# Patient Record
Sex: Male | Born: 1955 | Race: White | Hispanic: No | Marital: Single | Smoking: Former smoker
Health system: Southern US, Community
[De-identification: ages and names within clinical notes are randomized; demographics above are authoritative.]

## PROBLEM LIST (undated history)

## (undated) ENCOUNTER — Emergency Department: Admission: EM | Payer: Medicare Other

## (undated) DIAGNOSIS — C4491 Basal cell carcinoma of skin, unspecified: Secondary | ICD-10-CM

## (undated) DIAGNOSIS — G43909 Migraine, unspecified, not intractable, without status migrainosus: Secondary | ICD-10-CM

## (undated) DIAGNOSIS — M1990 Unspecified osteoarthritis, unspecified site: Secondary | ICD-10-CM

## (undated) DIAGNOSIS — K222 Esophageal obstruction: Secondary | ICD-10-CM

## (undated) DIAGNOSIS — K219 Gastro-esophageal reflux disease without esophagitis: Secondary | ICD-10-CM

## (undated) DIAGNOSIS — I10 Essential (primary) hypertension: Secondary | ICD-10-CM

## (undated) HISTORY — PX: ANKLE ARTHROPLASTY: SUR68

---

## 2005-08-15 ENCOUNTER — Emergency Department: Payer: Self-pay | Admitting: Emergency Medicine

## 2008-05-05 ENCOUNTER — Emergency Department: Payer: Self-pay | Admitting: Emergency Medicine

## 2008-05-19 ENCOUNTER — Emergency Department: Payer: Self-pay | Admitting: Emergency Medicine

## 2008-10-12 ENCOUNTER — Ambulatory Visit: Payer: Self-pay | Admitting: Gastroenterology

## 2012-03-13 ENCOUNTER — Emergency Department: Payer: Self-pay | Admitting: Emergency Medicine

## 2012-03-18 ENCOUNTER — Emergency Department: Payer: Self-pay | Admitting: *Deleted

## 2013-03-20 ENCOUNTER — Emergency Department: Payer: Self-pay | Admitting: Emergency Medicine

## 2013-03-31 ENCOUNTER — Emergency Department: Payer: Self-pay | Admitting: Emergency Medicine

## 2013-04-03 LAB — BETA STREP CULTURE(ARMC)

## 2013-07-18 DIAGNOSIS — R1310 Dysphagia, unspecified: Secondary | ICD-10-CM | POA: Insufficient documentation

## 2013-07-18 DIAGNOSIS — K222 Esophageal obstruction: Secondary | ICD-10-CM | POA: Insufficient documentation

## 2013-07-30 DIAGNOSIS — I10 Essential (primary) hypertension: Secondary | ICD-10-CM | POA: Diagnosis present

## 2013-10-18 ENCOUNTER — Emergency Department: Payer: Self-pay | Admitting: Emergency Medicine

## 2014-03-31 ENCOUNTER — Emergency Department: Payer: Self-pay | Admitting: Internal Medicine

## 2014-04-03 ENCOUNTER — Emergency Department: Payer: Self-pay | Admitting: Emergency Medicine

## 2014-10-16 DIAGNOSIS — Z8614 Personal history of Methicillin resistant Staphylococcus aureus infection: Secondary | ICD-10-CM

## 2014-10-16 HISTORY — DX: Personal history of Methicillin resistant Staphylococcus aureus infection: Z86.14

## 2014-12-25 DIAGNOSIS — K31A0 Gastric intestinal metaplasia, unspecified: Secondary | ICD-10-CM | POA: Insufficient documentation

## 2015-05-01 ENCOUNTER — Inpatient Hospital Stay
Admission: EM | Admit: 2015-05-01 | Discharge: 2015-05-02 | DRG: 918 | Disposition: A | Discharge status: Home / Self Care | Payer: Self-pay | Attending: Internal Medicine | Admitting: Internal Medicine

## 2015-05-01 ENCOUNTER — Emergency Department: Payer: Self-pay

## 2015-05-01 DIAGNOSIS — T40601A Poisoning by unspecified narcotics, accidental (unintentional), initial encounter: Secondary | ICD-10-CM | POA: Diagnosis present

## 2015-05-01 DIAGNOSIS — R0681 Apnea, not elsewhere classified: Secondary | ICD-10-CM

## 2015-05-01 DIAGNOSIS — F1210 Cannabis abuse, uncomplicated: Secondary | ICD-10-CM | POA: Diagnosis present

## 2015-05-01 DIAGNOSIS — F1310 Sedative, hypnotic or anxiolytic abuse, uncomplicated: Secondary | ICD-10-CM | POA: Diagnosis present

## 2015-05-01 DIAGNOSIS — I10 Essential (primary) hypertension: Secondary | ICD-10-CM | POA: Diagnosis present

## 2015-05-01 DIAGNOSIS — T40604A Poisoning by unspecified narcotics, undetermined, initial encounter: Principal | ICD-10-CM | POA: Diagnosis present

## 2015-05-01 DIAGNOSIS — Z79891 Long term (current) use of opiate analgesic: Secondary | ICD-10-CM

## 2015-05-01 HISTORY — DX: Essential (primary) hypertension: I10

## 2015-05-01 MED ORDER — NALOXONE HCL 1 MG/ML IJ SOLN
0.4000 mg | Freq: Once | INTRAMUSCULAR | Status: AC
  Administered 2015-05-02: 0.4 mg via INTRAVENOUS
  Filled 2015-05-01: qty 2

## 2015-05-01 MED ORDER — SODIUM CHLORIDE 0.9 % IV BOLUS (SEPSIS)
1000.0000 mL | Freq: Once | INTRAVENOUS | Status: AC
  Administered 2015-05-02: 1000 mL via INTRAVENOUS
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
## 2015-05-02 ENCOUNTER — Encounter: Payer: Self-pay | Admitting: Internal Medicine

## 2015-05-02 ENCOUNTER — Emergency Department: Payer: Self-pay

## 2015-05-02 DIAGNOSIS — T40601A Poisoning by unspecified narcotics, accidental (unintentional), initial encounter: Secondary | ICD-10-CM | POA: Diagnosis present

## 2015-05-02 LAB — BLOOD GAS, ARTERIAL
ACID-BASE EXCESS: 4.2 mmol/L — AB (ref 0.0–3.0)
Bicarbonate: 27.3 mEq/L (ref 21.0–28.0)
FIO2: 0.21 %
O2 Saturation: 99.3 %
Patient temperature: 37
pCO2 arterial: 35 mmHg (ref 32.0–48.0)
pH, Arterial: 7.5 — ABNORMAL HIGH (ref 7.350–7.450)
pO2, Arterial: 138 mmHg — ABNORMAL HIGH (ref 83.0–108.0)

## 2015-05-02 LAB — COMPREHENSIVE METABOLIC PANEL
ALT: 17 U/L (ref 17–63)
AST: 20 U/L (ref 15–41)
Albumin: 3.7 g/dL (ref 3.5–5.0)
Alkaline Phosphatase: 52 U/L (ref 38–126)
Anion gap: 8 (ref 5–15)
BUN: 8 mg/dL (ref 6–20)
CALCIUM: 8.8 mg/dL — AB (ref 8.9–10.3)
CO2: 29 mmol/L (ref 22–32)
CREATININE: 0.73 mg/dL (ref 0.61–1.24)
Chloride: 102 mmol/L (ref 101–111)
GLUCOSE: 98 mg/dL (ref 65–99)
Potassium: 3 mmol/L — ABNORMAL LOW (ref 3.5–5.1)
Sodium: 139 mmol/L (ref 135–145)
Total Bilirubin: 1.5 mg/dL — ABNORMAL HIGH (ref 0.3–1.2)
Total Protein: 6.7 g/dL (ref 6.5–8.1)

## 2015-05-02 LAB — URINALYSIS COMPLETE WITH MICROSCOPIC (ARMC ONLY)
BILIRUBIN URINE: NEGATIVE
Bacteria, UA: NONE SEEN
Glucose, UA: NEGATIVE mg/dL
Ketones, ur: NEGATIVE mg/dL
LEUKOCYTES UA: NEGATIVE
Nitrite: NEGATIVE
PH: 6 (ref 5.0–8.0)
Protein, ur: NEGATIVE mg/dL
Specific Gravity, Urine: 1.003 — ABNORMAL LOW (ref 1.005–1.030)
Squamous Epithelial / LPF: NONE SEEN

## 2015-05-02 LAB — CBC WITH DIFFERENTIAL/PLATELET
BASOS ABS: 0 10*3/uL (ref 0–0.1)
BASOS PCT: 1 %
EOS ABS: 0.1 10*3/uL (ref 0–0.7)
EOS PCT: 2 %
HCT: 38.4 % — ABNORMAL LOW (ref 40.0–52.0)
HEMOGLOBIN: 13 g/dL (ref 13.0–18.0)
LYMPHS ABS: 1.6 10*3/uL (ref 1.0–3.6)
Lymphocytes Relative: 24 %
MCH: 30.3 pg (ref 26.0–34.0)
MCHC: 34 g/dL (ref 32.0–36.0)
MCV: 89.2 fL (ref 80.0–100.0)
Monocytes Absolute: 0.6 10*3/uL (ref 0.2–1.0)
Monocytes Relative: 9 %
Neutro Abs: 4.2 10*3/uL (ref 1.4–6.5)
Neutrophils Relative %: 64 %
Platelets: 264 10*3/uL (ref 150–440)
RBC: 4.3 MIL/uL — AB (ref 4.40–5.90)
RDW: 13.2 % (ref 11.5–14.5)
WBC: 6.6 10*3/uL (ref 3.8–10.6)

## 2015-05-02 LAB — ACETAMINOPHEN LEVEL: Acetaminophen (Tylenol), Serum: <10 ug/mL — ABNORMAL LOW (ref 10–30)

## 2015-05-02 LAB — URINE DRUG SCREEN, QUALITATIVE (ARMC ONLY)
Amphetamines, Ur Screen: NOT DETECTED
BARBITURATES, UR SCREEN: NOT DETECTED
BENZODIAZEPINE, UR SCRN: POSITIVE — AB
Cannabinoid 50 Ng, Ur ~~LOC~~: POSITIVE — AB
Cocaine Metabolite,Ur ~~LOC~~: NOT DETECTED
MDMA (Ecstasy)Ur Screen: NOT DETECTED
Methadone Scn, Ur: POSITIVE — AB
OPIATE, UR SCREEN: POSITIVE — AB
Phencyclidine (PCP) Ur S: NOT DETECTED
Tricyclic, Ur Screen: NOT DETECTED

## 2015-05-02 LAB — PROTIME-INR
INR: 1.02
Prothrombin Time: 13.6 seconds (ref 11.4–15.0)

## 2015-05-02 LAB — HEMOGLOBIN A1C: Hgb A1c MFr Bld: 5.3 % (ref 4.0–6.0)

## 2015-05-02 LAB — ETHANOL

## 2015-05-02 LAB — SALICYLATE LEVEL

## 2015-05-02 LAB — TSH: TSH: 0.903 u[IU]/mL (ref 0.350–4.500)

## 2015-05-02 LAB — TROPONIN I: Troponin I: <0.03 ng/mL (ref <0.031)

## 2015-05-02 MED ORDER — HEPARIN SODIUM (PORCINE) 5000 UNIT/ML IJ SOLN
5000.0000 [IU] | Freq: Three times a day (TID) | INTRAMUSCULAR | Status: DC
  Administered 2015-05-02: 5000 [IU] via SUBCUTANEOUS
  Filled 2015-05-02: qty 1

## 2015-05-02 MED ORDER — NALOXONE HCL 1 MG/ML IJ SOLN
0.5000 mg/h | INTRAMUSCULAR | Status: DC
  Administered 2015-05-02: 0.5 mg/h via INTRAVENOUS
  Filled 2015-05-02: qty 4

## 2015-05-02 MED ORDER — ONDANSETRON HCL 4 MG PO TABS: 4.0000 mg | ORAL_TABLET | Freq: Four times a day (QID) | ORAL | Status: DC | PRN

## 2015-05-02 MED ORDER — NALOXONE HCL 1 MG/ML IJ SOLN
INTRAMUSCULAR | Status: AC
  Filled 2015-05-02: qty 2

## 2015-05-02 MED ORDER — NALOXONE HCL 1 MG/ML IJ SOLN
0.4000 mg | Freq: Once | INTRAMUSCULAR | Status: AC
  Administered 2015-05-02: 0.4 mg via INTRAVENOUS
  Filled 2015-05-02: qty 2

## 2015-05-02 MED ORDER — SODIUM CHLORIDE 0.9 % IV BOLUS (SEPSIS)
1000.0000 mL | Freq: Once | INTRAVENOUS | Status: AC
  Administered 2015-05-02: 1000 mL via INTRAVENOUS

## 2015-05-02 MED ORDER — NALOXONE HCL 1 MG/ML IJ SOLN
0.4000 mg | Freq: Once | INTRAMUSCULAR | Status: AC
  Administered 2015-05-02: 0.4 mg via INTRAVENOUS

## 2015-05-02 MED ORDER — POTASSIUM CHLORIDE IN NACL 40-0.9 MEQ/L-% IV SOLN
INTRAVENOUS | Status: DC
  Administered 2015-05-02: 100 mL/h via INTRAVENOUS
  Filled 2015-05-02 (×4): qty 1000

## 2015-05-02 MED ORDER — ONDANSETRON HCL 4 MG/2ML IJ SOLN: 4.0000 mg | Freq: Four times a day (QID) | INTRAMUSCULAR | Status: DC | PRN

## 2015-05-02 NOTE — ED Notes (Signed)
TTS tried to talk to pt. Pt. Irritable.  TTS will wait till morning to assess.
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
## 2015-05-02 NOTE — ED Notes (Signed)
Pt. Upon given narcan respirations increased from 6/min to 18/min.  Pt. Has become irritable and wanted more blankets or his clothes.  Pt. Given more blankets.

## 2015-05-02 NOTE — [facility] Counselor (Signed)
Unable to assess- pt irritable and incoherent
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
## 2015-05-02 NOTE — Discharge Instructions (Signed)

## 2015-05-02 NOTE — Progress Notes (Signed)
Pt's daughter arrived, tearful.  Unaware of pt's admission to hospital. Pt states he does not remember why he was admitted.  With pt's permission, explained to pt's daughter the circumstances surrounding pt's admission here.  Went over AVS w/pt and his daughter, including the community resources for substance abuse.  Encouraged pt to f/u w/his PCP with regard to taking methadone and norco at the same time.  Also discouraged pt from polysubstance abuse and driving. Pt and daughter verbalized understanding of all discharge instructions.  Pt discharged stable and ambulatory w/daughter.
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
## 2015-07-25 ENCOUNTER — Encounter: Payer: Self-pay | Admitting: Emergency Medicine

## 2015-07-25 ENCOUNTER — Emergency Department
Admission: EM | Admit: 2015-07-25 | Discharge: 2015-07-25 | Disposition: A | Discharge status: Home / Self Care | Payer: Self-pay | Attending: Emergency Medicine | Admitting: Emergency Medicine

## 2015-07-25 DIAGNOSIS — Z87891 Personal history of nicotine dependence: Secondary | ICD-10-CM | POA: Insufficient documentation

## 2015-07-25 DIAGNOSIS — I10 Essential (primary) hypertension: Secondary | ICD-10-CM | POA: Insufficient documentation

## 2015-07-25 DIAGNOSIS — L03116 Cellulitis of left lower limb: Secondary | ICD-10-CM | POA: Insufficient documentation

## 2015-07-25 MED ORDER — SULFAMETHOXAZOLE-TRIMETHOPRIM 800-160 MG PO TABS: 2.0000 | ORAL_TABLET | Freq: Two times a day (BID) | ORAL | Status: DC

## 2015-07-25 MED ORDER — TRAMADOL HCL 50 MG PO TABS: 50.0000 mg | ORAL_TABLET | Freq: Two times a day (BID) | ORAL | Status: DC

## 2015-07-25 MED ORDER — SULFAMETHOXAZOLE-TRIMETHOPRIM 800-160 MG PO TABS
2.0000 | ORAL_TABLET | Freq: Once | ORAL | Status: AC
  Administered 2015-07-25: 2 via ORAL
  Filled 2015-07-25: qty 2

## 2015-07-25 NOTE — ED Notes (Signed)
Pt reports painful red area without drainage to top left leg. Denies fever

## 2015-07-25 NOTE — ED Provider Notes (Signed)
[facility] Emergency Department Provider Note ____________________________________________  Time seen: 0825  I have reviewed the triage vital signs and the nursing notes.  HISTORY  Chief Complaint  Abscess  HPI Johnathan Harvey is a 59 y.o. male Reports to the ED with swelling and redness to the left leg just above the right knee for the last 2 days. He is unaware of any particular injury or trauma to the leg, but remembers recalling that he thought he had been bitten by a spider or mosquito a few days earlier. He does admit to a small bump that appeared, that he later squeezed. Since that time as noted increased redness and pain to the skin above and to the outside of the left knee. He denies any intermittent fevers, chills, sweats. He does report some pain and discomfort both to the scan and with bending the knee. He does report previous history of staph infection, and is concerned for that at this time.He reports pain at 8/10 in triage.  Past Medical History  Diagnosis Date  . Hypertension     Patient Active Problem List   Diagnosis Date Noted  . Opiate overdose 05/02/2015    Past Surgical History  Procedure Laterality Date  . Ankle arthroplasty      Current Outpatient Rx  Name  Route  Sig  Dispense  Refill  . HYDROcodone-acetaminophen (NORCO) 10-325 MG per tablet   Oral   Take 1 tablet by mouth every 6 (six) hours as needed.         . sulfamethoxazole-trimethoprim (BACTRIM DS,SEPTRA DS) 800-160 MG tablet   Oral   Take 2 tablets by mouth 2 (two) times daily.   40 tablet   0   . traMADol (ULTRAM) 50 MG tablet   Oral   Take 1 tablet (50 mg total) by mouth 2 (two) times daily.   10 tablet   0    Allergies Review of patient's allergies indicates no known allergies.  Family History  Problem Relation Age of Onset  . Other      Patient cannot contribute due to intoxication    Social History Social History  Substance Use Topics  .  Smoking status: Former Smoker -- 0.00 packs/day  . Smokeless tobacco: None  . Alcohol Use: No   Review of Systems  Constitutional: Negative for fever. Eyes: Negative for visual changes. ENT: Negative for sore throat. Cardiovascular: Negative for chest pain. Respiratory: Negative for shortness of breath. Gastrointestinal: Negative for abdominal pain, vomiting and diarrhea. Genitourinary: Negative for dysuria. Musculoskeletal: Negative for back pain. Skin: Negative for rash. LLE cellulitis as above Neurological: Negative for headaches, focal weakness or numbness. ____________________________________________  PHYSICAL EXAM:  VITAL SIGNS: ED Triage Vitals  Enc Vitals Group     BP 07/25/15 0753 128/77 mmHg     Pulse Rate 07/25/15 0753 91     Resp 07/25/15 0753 18     Temp 07/25/15 0753 98.2 F (36.8 C)     Temp Source 07/25/15 0753 Oral     SpO2 07/25/15 0753 99 %     Weight 07/25/15 0753 140 lb (63.504 kg)     Height 07/25/15 0753 5\' 10"  (1.778 m)     Head Cir --      Peak Flow --      Pain Score 07/25/15 0754 8     Pain Loc --      Pain Edu? --      Excl. in [place]? --  Constitutional: Alert and oriented. Well appearing and in no distress. Head: Normocephalic and atraumatic.      Eyes: Conjunctivae are normal. PERRL. Normal extraocular movements      Ears: Canals clear. TMs intact bilaterally.   Nose: No congestion/rhinorrhea.   Mouth/Throat: Mucous membranes are moist.   Neck: Supple. No thyromegaly. Hematological/Lymphatic/Immunological: No cervical lymphadenopathy. Cardiovascular: Normal rate, regular rhythm.  Respiratory: Normal respiratory effort. No wheezes/rales/rhonchi. Gastrointestinal: Soft and nontender. No distention. Musculoskeletal: normal knee exam on the left.Nontender with normal range of motion in all extremities.  Neurologic:  Normal gait without ataxia. Normal speech and language. No gross focal neurologic deficits are appreciated. Skin:   Skin is warm, dry and intact. No rash noted.patient with a large area of superficial erythema to the superior, lateral aspect of the left knee. Superiorly he is noted to have a quarter sized area of induration and redness with some superficial scab appreciated. There also appears to be some early streaking proximally. Psychiatric: Mood and affect are normal. Patient exhibits appropriate insight and judgment. ____________________________________________  PROCEDURES  Bactrim DS 2 tabs PO ____________________________________________  INITIAL IMPRESSION / ASSESSMENT AND PLAN / ED COURSE  Patient presents with a left lower extremity superficial cellulitis with a small area of induration noted superiorly. He will be discharged with Bactrim to dose 2 tabs twice daily as well as Ultram as needed for pain he is encouraged to rest with the leg elevated and apply warm compresses to promote healing. Return to the ED in 2-3 days as needed for wound check. He is also advised to return in the interim for any indication of an abscess collection for probable I&D. ____________________________________________  FINAL CLINICAL IMPRESSION(S) / ED DIAGNOSES  Final diagnoses:  Cellulitis of left lower extremity      Melvenia Needles, PA-C 07/25/15 [place], MD 07/25/15 1640

## 2015-07-25 NOTE — Discharge Instructions (Signed)
Cellulitis Cellulitis is an infection of the skin and the tissue beneath it. The infected area is usually red and tender. Cellulitis occurs most often in the arms and lower legs.  CAUSES  Cellulitis is caused by bacteria that enter the skin through cracks or cuts in the skin. The most common types of bacteria that cause cellulitis are staphylococci and streptococci. SIGNS AND SYMPTOMS   Redness and warmth.  Swelling.  Tenderness or pain.  Fever. DIAGNOSIS  Your health care provider can usually determine what is wrong based on a physical exam. Blood tests may also be done. TREATMENT  Treatment usually involves taking an antibiotic medicine. HOME CARE INSTRUCTIONS   Take your antibiotic medicine as directed by your health care provider. Finish the antibiotic even if you start to feel better.  Keep the infected arm or leg elevated to reduce swelling.  Apply a warm cloth to the affected area up to 4 times per day to relieve pain.  Take medicines only as directed by your health care provider.  Keep all follow-up visits as directed by your health care provider. SEEK MEDICAL CARE IF:   You notice red streaks coming from the infected area.  Your red area gets larger or turns dark in color.  Your bone or joint underneath the infected area becomes painful after the skin has healed.  Your infection returns in the same area or another area.  You notice a swollen bump in the infected area.  You develop new symptoms.  You have a fever. SEEK IMMEDIATE MEDICAL CARE IF:   You feel very sleepy.  You develop vomiting or diarrhea.  You have a general ill feeling (malaise) with muscle aches and pains.   This information is not intended to replace advice given to you by your health care provider. Make sure you discuss any questions you have with your health care provider.   Document Released: 07/12/2005 Document Revised: 06/23/2015 Document Reviewed: 12/18/2011 Elsevier Interactive  Patient Education 2016 [place] the leg elevated when seated.  Apply warm compresses to promote healing. Take the antibiotic as directed, until completely gone. Return as needed for wound checks.

## 2015-07-28 ENCOUNTER — Inpatient Hospital Stay
Admission: EM | Admit: 2015-07-28 | Discharge: 2015-07-31 | DRG: 603 | Disposition: A | Discharge status: Home / Self Care | Payer: Self-pay | Attending: Internal Medicine | Admitting: Internal Medicine

## 2015-07-28 DIAGNOSIS — K219 Gastro-esophageal reflux disease without esophagitis: Secondary | ICD-10-CM | POA: Insufficient documentation

## 2015-07-28 DIAGNOSIS — Z85828 Personal history of other malignant neoplasm of skin: Secondary | ICD-10-CM

## 2015-07-28 DIAGNOSIS — B9561 Methicillin susceptible Staphylococcus aureus infection as the cause of diseases classified elsewhere: Secondary | ICD-10-CM | POA: Diagnosis present

## 2015-07-28 DIAGNOSIS — L03116 Cellulitis of left lower limb: Principal | ICD-10-CM | POA: Diagnosis present

## 2015-07-28 DIAGNOSIS — L02416 Cutaneous abscess of left lower limb: Secondary | ICD-10-CM | POA: Diagnosis present

## 2015-07-28 DIAGNOSIS — R51 Headache: Secondary | ICD-10-CM | POA: Diagnosis present

## 2015-07-28 DIAGNOSIS — Z809 Family history of malignant neoplasm, unspecified: Secondary | ICD-10-CM

## 2015-07-28 DIAGNOSIS — I10 Essential (primary) hypertension: Secondary | ICD-10-CM | POA: Diagnosis present

## 2015-07-28 DIAGNOSIS — R519 Headache, unspecified: Secondary | ICD-10-CM

## 2015-07-28 DIAGNOSIS — M1990 Unspecified osteoarthritis, unspecified site: Secondary | ICD-10-CM | POA: Diagnosis present

## 2015-07-28 DIAGNOSIS — L02419 Cutaneous abscess of limb, unspecified: Secondary | ICD-10-CM | POA: Diagnosis present

## 2015-07-28 DIAGNOSIS — L03119 Cellulitis of unspecified part of limb: Secondary | ICD-10-CM

## 2015-07-28 DIAGNOSIS — Z87891 Personal history of nicotine dependence: Secondary | ICD-10-CM

## 2015-07-28 HISTORY — DX: Unspecified osteoarthritis, unspecified site: M19.90

## 2015-07-28 HISTORY — DX: Gastro-esophageal reflux disease without esophagitis: K21.9

## 2015-07-28 HISTORY — DX: Basal cell carcinoma of skin, unspecified: C44.91

## 2015-07-28 HISTORY — DX: Esophageal obstruction: K22.2

## 2015-07-28 LAB — CBC WITH DIFFERENTIAL/PLATELET
BASOS ABS: 0.1 10*3/uL (ref 0–0.1)
Basophils Relative: 1 %
Eosinophils Absolute: 0.2 10*3/uL (ref 0–0.7)
Eosinophils Relative: 3 %
HEMATOCRIT: 37.8 % — AB (ref 40.0–52.0)
Hemoglobin: 12.9 g/dL — ABNORMAL LOW (ref 13.0–18.0)
LYMPHS PCT: 21 %
Lymphs Abs: 1.8 10*3/uL (ref 1.0–3.6)
MCH: 30.7 pg (ref 26.0–34.0)
MCHC: 34.1 g/dL (ref 32.0–36.0)
MCV: 90 fL (ref 80.0–100.0)
MONO ABS: 0.8 10*3/uL (ref 0.2–1.0)
MONOS PCT: 10 %
Neutro Abs: 5.7 10*3/uL (ref 1.4–6.5)
Neutrophils Relative %: 67 %
Platelets: 287 10*3/uL (ref 150–440)
RBC: 4.2 MIL/uL — ABNORMAL LOW (ref 4.40–5.90)
RDW: 13.5 % (ref 11.5–14.5)
WBC: 8.5 10*3/uL (ref 3.8–10.6)

## 2015-07-28 LAB — BASIC METABOLIC PANEL
Anion gap: 8 (ref 5–15)
BUN: 14 mg/dL (ref 6–20)
CALCIUM: 8.9 mg/dL (ref 8.9–10.3)
CO2: 28 mmol/L (ref 22–32)
Chloride: 101 mmol/L (ref 101–111)
Creatinine, Ser: 0.94 mg/dL (ref 0.61–1.24)
GFR calc Af Amer: >60 mL/min (ref >60)
GFR calc non Af Amer: >60 mL/min (ref >60)
GLUCOSE: 85 mg/dL (ref 65–99)
Potassium: 3.7 mmol/L (ref 3.5–5.1)
Sodium: 137 mmol/L (ref 135–145)

## 2015-07-28 MED ORDER — VANCOMYCIN HCL IN DEXTROSE 1-5 GM/200ML-% IV SOLN
1000.0000 mg | Freq: Once | INTRAVENOUS | Status: AC
  Administered 2015-07-28: 1000 mg via INTRAVENOUS
  Filled 2015-07-28: qty 200

## 2015-07-28 MED ORDER — ACETAMINOPHEN 650 MG RE SUPP: 650.0000 mg | Freq: Four times a day (QID) | RECTAL | Status: DC | PRN

## 2015-07-28 MED ORDER — ONDANSETRON HCL 4 MG/2ML IJ SOLN: 4.0000 mg | Freq: Four times a day (QID) | INTRAMUSCULAR | Status: DC | PRN

## 2015-07-28 MED ORDER — ENOXAPARIN SODIUM 40 MG/0.4ML ~~LOC~~ SOLN
40.0000 mg | SUBCUTANEOUS | Status: DC
  Administered 2015-07-28 – 2015-07-31 (×3): 40 mg via SUBCUTANEOUS
  Filled 2015-07-28 (×3): qty 0.4

## 2015-07-28 MED ORDER — MORPHINE SULFATE (PF) 2 MG/ML IV SOLN
2.0000 mg | INTRAVENOUS | Status: DC | PRN
  Administered 2015-07-28 – 2015-07-30 (×11): 2 mg via INTRAVENOUS
  Filled 2015-07-28 (×10): qty 1

## 2015-07-28 MED ORDER — VANCOMYCIN HCL IN DEXTROSE 750-5 MG/150ML-% IV SOLN
750.0000 mg | Freq: Two times a day (BID) | INTRAVENOUS | Status: DC
Start: 2015-07-29 — End: 2015-07-30
  Administered 2015-07-29 (×2): 750 mg via INTRAVENOUS
  Filled 2015-07-28 (×4): qty 150

## 2015-07-28 MED ORDER — MORPHINE SULFATE (PF) 2 MG/ML IV SOLN
INTRAVENOUS | Status: AC
  Administered 2015-07-28: 2 mg via INTRAVENOUS
  Filled 2015-07-28: qty 1

## 2015-07-28 MED ORDER — SODIUM CHLORIDE 0.9 % IV BOLUS (SEPSIS)
1000.0000 mL | Freq: Once | INTRAVENOUS | Status: AC
  Administered 2015-07-28: 1000 mL via INTRAVENOUS

## 2015-07-28 MED ORDER — ONDANSETRON HCL 4 MG PO TABS: 4.0000 mg | ORAL_TABLET | Freq: Four times a day (QID) | ORAL | Status: DC | PRN

## 2015-07-28 MED ORDER — ACETAMINOPHEN 325 MG PO TABS: 650.0000 mg | ORAL_TABLET | Freq: Four times a day (QID) | ORAL | Status: DC | PRN

## 2015-07-28 MED ORDER — FAMOTIDINE 20 MG PO TABS
20.0000 mg | ORAL_TABLET | Freq: Every day | ORAL | Status: DC
  Administered 2015-07-29 – 2015-07-31 (×3): 20 mg via ORAL
  Filled 2015-07-28 (×3): qty 1

## 2015-07-28 NOTE — H&P (Signed)
Johnathan Harvey at [place] NAME: Johnathan Harvey    MR#:  [id number]  DATE OF BIRTH:  [birth year]  DATE OF ADMISSION:  07/28/2015  PRIMARY CARE PHYSICIAN: No primary care provider on file.   REQUESTING/REFERRING PHYSICIAN: Reita Cliche, M.D.  CHIEF COMPLAINT:   Chief Complaint  Patient presents with  . Leg Pain    HISTORY OF PRESENT ILLNESS:  Johnathan Harvey  is a 59 y.o. male who presents with worsening cellulitis and abscess of left lower extremity. Patient came to the ED 6 days ago when his symptoms started. He states that initially he noticed what looked like a boil, with surrounding erythema and tenderness just proximal to his left knee. In the ED he was evaluated and given Bactrim for cellulitis. He states that he had something similar more than a decade ago, which at that time was identified as staph skin infection. He was in the hospital for a week at that time with IV antibiotics. He went home after evaluation in the ED this time, and his symptoms did not improve rather began to get worse. He comes back tonight for evaluation due to debilitating pain when he walks, and spread of his erythema and symptoms. The blood on his leg has ruptured since that time and is draining purulent material. Hospitalists were called for admission for cellulitis and abscess which failed outpatient antibiotics.  PAST MEDICAL HISTORY:   Past Medical History  Diagnosis Date  . Hypertension   . GERD (gastroesophageal reflux disease)   . Esophageal stricture   . Skin cancer, basal cell   . Arthritis     PAST SURGICAL HISTORY:   Past Surgical History  Procedure Laterality Date  . Ankle arthroplasty      SOCIAL HISTORY:   Social History  Substance Use Topics  . Smoking status: Former Smoker -- 0.00 packs/day  . Smokeless tobacco: Not on file  . Alcohol Use: No    FAMILY HISTORY:   Family History  Problem Relation Age of Onset  . Cancer Mother    . Cancer Father     DRUG ALLERGIES:  No Known Allergies  MEDICATIONS AT HOME:   Prior to Admission medications   Medication Sig Start Date End Date Taking? Authorizing Provider  sulfamethoxazole-trimethoprim (BACTRIM DS,SEPTRA DS) 800-160 MG tablet Take 2 tablets by mouth 2 (two) times daily. 07/25/15  Yes Jenise V Bacon Menshew, PA-C  traMADol (ULTRAM) 50 MG tablet Take 1 tablet (50 mg total) by mouth 2 (two) times daily. Patient not taking: Reported on 07/28/2015 07/25/15   Dannielle Karvonen Menshew, PA-C    REVIEW OF SYSTEMS:  Review of Systems  Constitutional: Positive for fever (subjective, 1). Negative for chills, weight loss and malaise/fatigue.  HENT: Negative for ear pain, hearing loss and tinnitus.   Eyes: Negative for blurred vision, double vision, pain and redness.  Respiratory: Negative for cough, hemoptysis and shortness of breath.   Cardiovascular: Negative for chest pain, palpitations, orthopnea and leg swelling.  Gastrointestinal: Negative for nausea, vomiting, abdominal pain, diarrhea and constipation.  Genitourinary: Negative for dysuria, frequency and hematuria.  Musculoskeletal: Positive for joint pain (Left knee). Negative for back pain and neck pain.  Skin:       Left lower extremity erythema and draining abscess. No acne, rash  Neurological: Negative for dizziness, tremors, focal weakness and weakness.  Endo/Heme/Allergies: Negative for polydipsia. Does not bruise/bleed easily.  Psychiatric/Behavioral: Negative for depression. The patient is not nervous/anxious and does not  have insomnia.      VITAL SIGNS:   Filed Vitals:   07/28/15 1632 07/28/15 2008 07/28/15 2009 07/28/15 2136  BP: 125/83  121/72 135/90  Pulse: 64  67 62  Temp: 98.2 F (36.8 C)     TempSrc: Oral     Resp: 16  18 18   Height: 5\' 10"  (1.778 m)     Weight: 62.596 kg (138 lb)     SpO2: 97% 98% 98% 96%   Wt Readings from Last 3 Encounters:  07/28/15 62.596 kg (138 lb)  07/25/15 63.504  kg (140 lb)  05/02/15 68.04 kg (150 lb)    PHYSICAL EXAMINATION:  Physical Exam  Vitals reviewed. Constitutional: He is oriented to person, place, and time. He appears well-developed and well-nourished. No distress.  HENT:  Head: Normocephalic and atraumatic.  Mouth/Throat: Oropharynx is clear and moist.  Eyes: Conjunctivae and EOM are normal. Pupils are equal, round, and reactive to light. No scleral icterus.  Neck: Normal range of motion. Neck supple. No JVD present. No thyromegaly present.  Cardiovascular: Normal rate, regular rhythm and intact distal pulses.  Exam reveals no gallop and no friction rub.   No murmur heard. Respiratory: Effort normal and breath sounds normal. No respiratory distress. He has no wheezes. He has no rales.  GI: Soft. Bowel sounds are normal. He exhibits no distension. There is no tenderness.  Musculoskeletal: Normal range of motion. He exhibits no edema.  No arthritis, no gout  Lymphadenopathy:    He has no cervical adenopathy.  Neurological: He is alert and oriented to person, place, and time. No cranial nerve deficit.  No dysarthria, no aphasia  Skin: Skin is warm and dry. No rash noted. There is erythema (left lower extremity).  Draining abscess proximal to the left knee with surrounding warmth and erythema and significant tenderness to palpation  Psychiatric: He has a normal mood and affect. His behavior is normal. Judgment and thought content normal.    LABORATORY PANEL:   CBC  Recent Labs Lab 07/28/15 2040  WBC 8.5  HGB 12.9*  HCT 37.8*  PLT 287   ------------------------------------------------------------------------------------------------------------------  Chemistries   Recent Labs Lab 07/28/15 2040  NA 137  K 3.7  CL 101  CO2 28  GLUCOSE 85  BUN 14  CREATININE 0.94  CALCIUM 8.9   ------------------------------------------------------------------------------------------------------------------  Cardiac Enzymes No  results for input(s): TROPONINI in the last 168 hours. ------------------------------------------------------------------------------------------------------------------  RADIOLOGY:  No results found.  EKG:   Orders placed or performed during the hospital encounter of 05/01/15  . EKG 12-Lead  . EKG 12-Lead  . EKG    IMPRESSION AND PLAN:  Principal Problem:   Cellulitis and abscess of leg - IV vancomycin given in the ED, we'll continue this on admission. White blood cell count was not elevated. Abscess is currently draining, however, if no significant improvement over the next 24-48 hours may need further incision and drainage. Active Problems:   HTN (hypertension) - currently stable, not on medications for this, we'll monitor and use IV antihypertensives if needed.   GERD (gastroesophageal reflux disease) - daily Pepcid while here   Arthritis - analgesics as necessary  All the records are reviewed and case discussed with ED provider. Management plans discussed with the patient and/or family.  DVT PROPHYLAXIS: SubQ lovenox  ADMISSION STATUS: Inpatient  CODE STATUS: Full  TOTAL TIME TAKING CARE OF THIS PATIENT: 40 minutes.    Erianna Jolly FIELDING 07/28/2015, 10:45 PM  [employer] [facility]  Office  [phone]  CC: Primary care physician; No primary care provider on file.

## 2015-07-28 NOTE — ED Notes (Signed)
Admitting MD at bedside for eval.

## 2015-07-28 NOTE — Progress Notes (Signed)
ANTIBIOTIC CONSULT NOTE - INITIAL  Pharmacy Consult for vancomycin Indication: cellulitis  No Known Allergies  Patient Measurements: Height: 5\' 10"  (177.8 cm) Weight: 130 lb 9.6 oz (59.24 kg) IBW/kg (Calculated) : 73 Adjusted Body Weight: 59.2 kg  Vital Signs: Temp: 98.2 F (36.8 C) (10/12 2327) Temp Source: Oral (10/12 2327) BP: 148/86 mmHg (10/12 2327) Pulse Rate: 52 (10/12 2327) Intake/Output from previous day:   Intake/Output from this shift:    Labs:  Recent Labs  07/28/15 2040  WBC 8.5  HGB 12.9*  PLT 287  CREATININE 0.94   Estimated Creatinine Clearance: 69.1 mL/min (by C-G formula based on Cr of 0.94). No results for input(s): VANCOTROUGH, VANCOPEAK, VANCORANDOM, GENTTROUGH, GENTPEAK, GENTRANDOM, TOBRATROUGH, TOBRAPEAK, TOBRARND, AMIKACINPEAK, AMIKACINTROU, AMIKACIN in the last 72 hours.   Microbiology: No results found for this or any previous visit (from the past 720 hour(s)).  Medical History: Past Medical History  Diagnosis Date  . Hypertension   . GERD (gastroesophageal reflux disease)   . Esophageal stricture   . Skin cancer, basal cell   . Arthritis     Medications:  Infusions:   Assessment: 59 yom cc leg pain with worsening cellulitis/abscess of LLE. Came to ED 6 days ago for boil, was given Bactrim, pain has worsened. Starting IV vancomycin for presumed staph.   Vd 41.4 L, Ke 0.062 hr, T1/2 11.2 hr, predicted trough 17 mcg/mL.  Goal of Therapy:  Vancomycin trough level 15-20 mcg/ml  Plan:  Expected duration 5 days with resolution of temperature and/or normalization of WBC. Vancomycin 750 mg IV Q12H. Received load dose of 1 gm in ED, will not use stacked dosing. Trough on 10/14 before fourth dose.   Laural Benes, Pharm.D. Clinical Pharmacist 07/28/2015,11:41 PM

## 2015-07-28 NOTE — ED Notes (Signed)
Patient was here Sunday for abscess to left knee and sent home with antibiotic and pain medications.  Patient can't remember names of medication.  Left knee is swollen, red, draining, and warm to touch.

## 2015-07-28 NOTE — ED Notes (Signed)
MD at bedside for eval.

## 2015-07-28 NOTE — ED Provider Notes (Signed)
[facility] Emergency Department Provider Note   ____________________________________________  Time seen: 8 PM I have reviewed the triage vital signs and the triage nursing note.  HISTORY  Chief Complaint Leg Pain   Historian Patient  HPI Johnathan Harvey is a 59 y.o. male who is here for reevaluation of his left knee/leg draining wound. He was seen on Sunday for early abscess and sent home with Bactrim and Ultram. He states that since then the drainage has increased, the redness has increased, the pain has increased and there is pain and red streaking up into the left groin now. No fever. Positive nausea. No chest pain shortness of breath.    Past Medical History  Diagnosis Date  . Hypertension     Patient Active Problem List   Diagnosis Date Noted  . Opiate overdose 05/02/2015    Past Surgical History  Procedure Laterality Date  . Ankle arthroplasty      Current Outpatient Rx  Name  Route  Sig  Dispense  Refill  . sulfamethoxazole-trimethoprim (BACTRIM DS,SEPTRA DS) 800-160 MG tablet   Oral   Take 2 tablets by mouth 2 (two) times daily.   40 tablet   0   . traMADol (ULTRAM) 50 MG tablet   Oral   Take 1 tablet (50 mg total) by mouth 2 (two) times daily. Patient not taking: Reported on 07/28/2015   10 tablet   0     Allergies Review of patient's allergies indicates no known allergies.  Family History  Problem Relation Age of Onset  . Other      Patient cannot contribute due to intoxication    Social History Social History  Substance Use Topics  . Smoking status: Former Smoker -- 0.00 packs/day  . Smokeless tobacco: None  . Alcohol Use: No    Review of Systems  Constitutional: Negative for fever. Eyes: Negative for visual changes. ENT: Negative for sore throat. Cardiovascular: Negative for chest pain. Respiratory: Negative for shortness of breath. Gastrointestinal: Negative for abdominal pain, vomiting and  diarrhea. Genitourinary: Negative for dysuria. Musculoskeletal: Negative for back pain. Skin: as per history of present illness Neurological: Negative for headache. 10 point Review of Systems otherwise negative ____________________________________________   PHYSICAL EXAM:  VITAL SIGNS: ED Triage Vitals  Enc Vitals Group     BP 07/28/15 1632 125/83 mmHg     Pulse Rate 07/28/15 1632 64     Resp 07/28/15 1632 16     Temp 07/28/15 1632 98.2 F (36.8 C)     Temp Source 07/28/15 1632 Oral     SpO2 07/28/15 1632 97 %     Weight 07/28/15 1632 138 lb (62.596 kg)     Height 07/28/15 1632 5\' 10"  (1.778 m)     Head Cir --      Peak Flow --      Pain Score 07/28/15 1633 8     Pain Loc --      Pain Edu? --      Excl. in [place]? --      Constitutional: Alert and oriented. Well appearing and in no distress. Eyes: Conjunctivae are normal. PERRL. Normal extraocular movements. ENT   Head: Normocephalic and atraumatic.   Nose: No congestion/rhinnorhea.   Mouth/Throat: Mucous membranes are moist.   Neck: No stridor. Cardiovascular/Chest: Normal rate, regular rhythm.  No murmurs, rubs, or gallops. Respiratory: Normal respiratory effort without tachypnea nor retractions. Breath sounds are clear and equal bilaterally. No wheezes/rales/rhonchi. Gastrointestinal: Soft. No distention, no  guarding, no rebound. Nontender   Genitourinary/rectal:Deferred Musculoskeletal: large area of redness and induration which is tender to palpation without crepitus of the left inner knee/thigh area with an area of drainage. Streaking erythema up the left inner thigh. Redness extending down onto the anterior left shin. Neurologic:  Normal speech and language. No gross or focal neurologic deficits are appreciated. Skin:  Skin is warm. Psychiatric: Mood and affect are normal. Speech and behavior are normal. Patient exhibits appropriate insight and judgment.  ____________________________________________    EKG I, Lisa Roca, MD, the attending physician have personally viewed and interpreted all ECGs.  No EKG performed ____________________________________________  LABS (pertinent positives/negatives)  White blood count 8.5, hemoglobin 12.9, platelet count 330 Metabolic panel within normal limits Blood cultures 2 sent  ____________________________________________  RADIOLOGY All Xrays were viewed by me. Imaging interpreted by Radiologist.  none __________________________________________  PROCEDURES  Procedure(s) performed: None  Critical Care performed: None  ____________________________________________   ED COURSE / ASSESSMENT AND PLAN  CONSULTATIONS: hospitalist for admission  Pertinent labs & imaging results that were available during my care of the patient were reviewed by me and considered in my medical decision making (see chart for details).   The patient's wound infection/cellulitis is much worse than it was on Sunday based on the photos that he showed me. His only systemic illness symptom is nausea, which she attributes this may be due to the antibiotic itself. It is clear that the Bactrim is not sufficient to clear this infection and I suspect resistance. The wound itself has no additional fluctuance and is already open and draining.  There is no evidence of additional area to I&D. I will add IV vancomycin and admit the patient for treatment of limb threatening cellulitis infection.  Patient / Family / Caregiver informed of clinical course, medical decision-making process, and agree with plan.   ___________________________________________   FINAL CLINICAL IMPRESSION(S) / ED DIAGNOSES   Final diagnoses:  Cellulitis of left leg       Lisa Roca, MD 07/28/15 2155

## 2015-07-29 LAB — CBC
HEMATOCRIT: 35.3 % — AB (ref 40.0–52.0)
HEMOGLOBIN: 12 g/dL — AB (ref 13.0–18.0)
MCH: 31 pg (ref 26.0–34.0)
MCHC: 34.1 g/dL (ref 32.0–36.0)
MCV: 90.7 fL (ref 80.0–100.0)
Platelets: 261 10*3/uL (ref 150–440)
RBC: 3.89 MIL/uL — ABNORMAL LOW (ref 4.40–5.90)
RDW: 13.3 % (ref 11.5–14.5)
WBC: 6.6 10*3/uL (ref 3.8–10.6)

## 2015-07-29 LAB — BASIC METABOLIC PANEL
ANION GAP: 6 (ref 5–15)
BUN: 10 mg/dL (ref 6–20)
CHLORIDE: 106 mmol/L (ref 101–111)
CO2: 23 mmol/L (ref 22–32)
Calcium: 8.2 mg/dL — ABNORMAL LOW (ref 8.9–10.3)
Creatinine, Ser: 0.77 mg/dL (ref 0.61–1.24)
GFR calc Af Amer: >60 mL/min (ref >60)
GLUCOSE: 94 mg/dL (ref 65–99)
POTASSIUM: 4.1 mmol/L (ref 3.5–5.1)
Sodium: 135 mmol/L (ref 135–145)

## 2015-07-29 MED ORDER — SODIUM CHLORIDE 0.9 % IV SOLN
3.0000 g | Freq: Three times a day (TID) | INTRAVENOUS | Status: DC
  Administered 2015-07-29 – 2015-07-31 (×6): 3 g via INTRAVENOUS
  Filled 2015-07-29 (×8): qty 3

## 2015-07-29 MED ORDER — HYDROCODONE-ACETAMINOPHEN 5-325 MG PO TABS
1.0000 | ORAL_TABLET | ORAL | Status: DC | PRN
  Administered 2015-07-29 – 2015-07-31 (×9): 2 via ORAL
  Filled 2015-07-29 (×9): qty 2

## 2015-07-29 MED ORDER — INFLUENZA VAC SPLIT QUAD 0.5 ML IM SUSY
0.5000 mL | PREFILLED_SYRINGE | INTRAMUSCULAR | Status: AC
Start: 2015-07-30 — End: 2015-07-31
  Administered 2015-07-31: 0.5 mL via INTRAMUSCULAR
  Filled 2015-07-29: qty 0.5

## 2015-07-29 NOTE — Progress Notes (Signed)
   07/29/15 1000  Clinical Encounter Type  Visited With Patient  Visit Type Initial  Consult/Referral To Chaplain  Stress Factors  Patient Stress Factors Not reviewed  Chaplain rounded in unit and offered pastoral care.   Chaplain Delvin Hedeen [phone]

## 2015-07-29 NOTE — Progress Notes (Addendum)
Patient ID: Johnathan Harvey, male   DOB: 1956/03/03, 59 y.o.   MRN: [id number] [facility] PROGRESS NOTE  PCP: No primary care provider on file.  HPI/Subjective: The patient showed me pictures of what it looked like initially and however progressed over time. The patient had a severe pain in his leg yesterday and was unable to work. It has gotten progressively bigger over time. He had chills last night. The area on his leg started draining.  Objective: Filed Vitals:   07/29/15 0743  BP: 126/84  Pulse: 62  Temp: 98.6 F (37 C)  Resp: 18    Filed Weights   07/28/15 1632 07/28/15 2329  Weight: 62.596 kg (138 lb) 59.24 kg (130 lb 9.6 oz)    ROS: Review of Systems  Constitutional: Positive for fever and chills.  Eyes: Negative for blurred vision.  Respiratory: Negative for cough and shortness of breath.   Cardiovascular: Negative for chest pain.  Gastrointestinal: Negative for nausea, vomiting, abdominal pain, diarrhea and constipation.  Genitourinary: Negative for dysuria.  Musculoskeletal: Negative for joint pain.  Neurological: Negative for dizziness and headaches.   Exam: Physical Exam  Constitutional: He is oriented to person, place, and time.  HENT:  Nose: No mucosal edema.  Mouth/Throat: No oropharyngeal exudate or posterior oropharyngeal edema.  Eyes: Conjunctivae, EOM and lids are normal. Pupils are equal, round, and reactive to light.  Neck: No JVD present. Carotid bruit is not present. No edema present. No thyroid mass and no thyromegaly present.  Cardiovascular: S1 normal and S2 normal.  Exam reveals no gallop.   No murmur heard. Pulses:      Dorsalis pedis pulses are 2+ on the right side, and 2+ on the left side.  Respiratory: No respiratory distress. He has no wheezes. He has no rhonchi. He has no rales.  GI: Soft. Bowel sounds are normal. There is no tenderness.  Musculoskeletal:       Right ankle: He exhibits no swelling.       Left ankle:  He exhibits no swelling.  Lymphadenopathy:    He has no cervical adenopathy.       Left: Inguinal adenopathy present.  Neurological: He is alert and oriented to person, place, and time. No cranial nerve deficit.  Skin: Skin is warm. Nails show no clubbing.  Left thigh above the knee. A quarter size scab with swelling and surrounding erythema. I'm not seeing any drainage from there.  Psychiatric: He has a normal mood and affect.    Data Reviewed: Basic Metabolic Panel:  Recent Labs Lab 07/28/15 2040 07/29/15 0706  NA 137 135  K 3.7 4.1  CL 101 106  CO2 28 23  GLUCOSE 85 94  BUN 14 10  CREATININE 0.94 0.77  CALCIUM 8.9 8.2*   CBC:  Recent Labs Lab 07/28/15 2040 07/29/15 0706  WBC 8.5 6.6  NEUTROABS 5.7  --   HGB 12.9* 12.0*  HCT 37.8* 35.3*  MCV 90.0 90.7  PLT 287 261     Recent Results (from the past 240 hour(s))  Culture, blood (routine x 2)     Status: None (Preliminary result)   Collection Time: 07/28/15  8:44 PM  Result Value Ref Range Status   Specimen Description BLOOD RIGHT ARM  Final   Special Requests BOTTLES DRAWN AEROBIC AND ANAEROBIC 9ML  Final   Culture NO GROWTH < 12 HOURS  Final   Report Status PENDING  Incomplete      Scheduled Meds: . ampicillin-sulbactam (UNASYN)  IV  3 g Intravenous Q8H  . enoxaparin (LOVENOX) injection  40 mg Subcutaneous Q24H  . famotidine  20 mg Oral Daily  . [START ON 07/30/2015] Influenza vac split quadrivalent PF  0.5 mL Intramuscular Tomorrow-1000  . vancomycin  750 mg Intravenous Q12H    Assessment/Plan:   1. Abscess and cellulitis of the left leg, with pain in the left leg. Patient placed on IV vancomycin in the ER. I will add IV Unasyn. I spoke with Dr. Marina Gravel general surgery to see the patient in consultation for possibility of drainage of the abscess. So far blood cultures are negative. Was placed on Bactrim as outpatient but failed outpatient treatment course. Oral pain medications. 2. History of hypertension  but not on any medications for this 3. Gastroesophageal reflux disease- on famotidine  Code Status:     Code Status Orders        Start     Ordered   07/28/15 2327  Full code   Continuous     07/28/15 2326     Disposition Plan: Home once infection improves   Consultants:  General surgery  Antibiotics:  Vancomycin   Unasyn   Time spent: 24 minutes   Loletha Grayer  [facility] Hospital[facility]

## 2015-07-29 NOTE — Progress Notes (Signed)
Surgery  Asked see this patient with a left anterior thigh abscess.  The process started on Saturday. He has failed oral outpatient antibiotics.  On physical examination there is no cellulitis around the anterior distal thigh with an area of skin necrosis measuring the size of a quarter. There were some serous purulent drainage.  Utilizing Q-tip the scab was detached and a small amount of pus was expressed. Dry bandage was applied. No further surgical intervention is required.

## 2015-07-29 NOTE — Care Management Note (Addendum)
Case Management Note  Patient Details  Name: Johnathan Harvey MRN: [id number] Date of Birth: [birth year]  Subjective/Objective:                  Met with patient to discuss discharge planning. He is an [facility] resident. He lives alone. He has a daughter that lives in the area and he has a good relationship with her. He drives. He states he makes around $1800/month. He is not able to afford his blood pressure medicine. He does not have a pharmacy. He has no PCP.His contact is [phone]. He agrees to going to [facility]. Patient is independent with mobility.   Action/Plan: Application to [facility] and Medication Management delivered to this patient. He agrees to referral to [facility]. I have emailed referral to Marton Redwood at [facility] to contact patient. RNCM to follow in case discharge is on weekend or late Friday (Medication Management hours of operation). MATCH may be needed.   Expected Discharge Date:                  Expected Discharge Plan:     In-House Referral:     Discharge planning Services  CM Consult, [facility], Medication Assistance  Post Acute Care Choice:    Choice offered to:  Patient  DME Arranged:  N/A DME Agency:     HH Arranged:    [place] Agency:     Status of Service:  In process, will continue to follow  Medicare Important Message Given:    Date Medicare IM Given:    Medicare IM give by:    Date Additional Medicare IM Given:    Additional Medicare Important Message give by:     If discussed at [place] of Stay Meetings, dates discussed:    Additional Comments: MATCH application delivered to patient.   Marshell Garfinkel, RN 07/29/2015, 1:01 PM

## 2015-07-30 LAB — VANCOMYCIN, TROUGH: VANCOMYCIN TR: 12 ug/mL (ref 10–20)

## 2015-07-30 MED ORDER — DIPHENHYDRAMINE HCL 25 MG PO CAPS
25.0000 mg | ORAL_CAPSULE | Freq: Four times a day (QID) | ORAL | Status: DC | PRN
  Administered 2015-07-30: 25 mg via ORAL
  Filled 2015-07-30: qty 1

## 2015-07-30 MED ORDER — IBUPROFEN 600 MG PO TABS
600.0000 mg | ORAL_TABLET | Freq: Four times a day (QID) | ORAL | Status: DC | PRN
  Filled 2015-07-30: qty 1

## 2015-07-30 MED ORDER — VANCOMYCIN HCL IN DEXTROSE 750-5 MG/150ML-% IV SOLN
750.0000 mg | Freq: Three times a day (TID) | INTRAVENOUS | Status: DC
  Administered 2015-07-30 – 2015-07-31 (×4): 750 mg via INTRAVENOUS
  Filled 2015-07-30 (×7): qty 150

## 2015-07-30 NOTE — Progress Notes (Signed)
Patient ID: Johnathan Harvey, male   DOB: 1956/07/19, 59 y.o.   MRN: [id number] [facility] PROGRESS NOTE  PCP: No primary care provider on file.  HPI/Subjective: The patient is having a headache. His left thigh area is feeling better than yesterday but still painful especially when he puts weight on it. Area was draining after surgery pulled the scab off.  Objective: Filed Vitals:   07/30/15 0753  BP: 126/83  Pulse: 50  Temp: 98.2 F (36.8 C)  Resp: 18    Filed Weights   07/28/15 1632 07/28/15 2329  Weight: 62.596 kg (138 lb) 59.24 kg (130 lb 9.6 oz)    ROS: Review of Systems  Constitutional: Negative for fever and chills.  Eyes: Negative for blurred vision.  Respiratory: Negative for cough and shortness of breath.   Cardiovascular: Negative for chest pain.  Gastrointestinal: Positive for constipation. Negative for nausea, vomiting, abdominal pain and diarrhea.  Genitourinary: Negative for dysuria.  Musculoskeletal: Negative for joint pain.  Neurological: Positive for headaches. Negative for dizziness.   Exam: Physical Exam  Constitutional: He is oriented to person, place, and time.  HENT:  Nose: No mucosal edema.  Mouth/Throat: No oropharyngeal exudate or posterior oropharyngeal edema.  Eyes: Conjunctivae, EOM and lids are normal. Pupils are equal, round, and reactive to light.  Neck: No JVD present. Carotid bruit is not present. No edema present. No thyroid mass and no thyromegaly present.  Cardiovascular: S1 normal and S2 normal.  Exam reveals no gallop.   No murmur heard. Pulses:      Dorsalis pedis pulses are 2+ on the right side, and 2+ on the left side.  Respiratory: No respiratory distress. He has no wheezes. He has no rhonchi. He has no rales.  GI: Soft. Bowel sounds are normal. There is no tenderness.  Musculoskeletal:       Left knee: He exhibits decreased range of motion.       Right ankle: He exhibits no swelling.       Left ankle: He  exhibits no swelling.  Lymphadenopathy:    He has no cervical adenopathy.       Left: Inguinal adenopathy present.  Neurological: He is alert and oriented to person, place, and time. No cranial nerve deficit.  Skin: Skin is warm. Nails show no clubbing.  Left thigh above the knee. Area where scab was removed and slight drainage. Erythema is less than yesterday but still present on the left thigh.  Psychiatric: He has a normal mood and affect.    Data Reviewed: Basic Metabolic Panel:  Recent Labs Lab 07/28/15 2040 07/29/15 0706  NA 137 135  K 3.7 4.1  CL 101 106  CO2 28 23  GLUCOSE 85 94  BUN 14 10  CREATININE 0.94 0.77  CALCIUM 8.9 8.2*   CBC:  Recent Labs Lab 07/28/15 2040 07/29/15 0706  WBC 8.5 6.6  NEUTROABS 5.7  --   HGB 12.9* 12.0*  HCT 37.8* 35.3*  MCV 90.0 90.7  PLT 287 261     Recent Results (from the past 240 hour(s))  Culture, blood (routine x 2)     Status: None (Preliminary result)   Collection Time: 07/28/15  8:44 PM  Result Value Ref Range Status   Specimen Description BLOOD RIGHT ARM  Final   Special Requests BOTTLES DRAWN AEROBIC AND ANAEROBIC 9ML  Final   Culture NO GROWTH 2 DAYS  Final   Report Status PENDING  Incomplete  Wound culture  Status: None (Preliminary result)   Collection Time: 07/29/15 10:23 AM  Result Value Ref Range Status   Specimen Description KNEE  Final   Special Requests LEFT KNEE ABSCESS  Final   Gram Stain RARE WBC SEEN FEW GRAM POSITIVE COCCI   Final   Culture   Final    MODERATE GROWTH STAPHYLOCOCCUS AUREUS SUSCEPTIBILITIES TO FOLLOW    Report Status PENDING  Incomplete      Scheduled Meds: . ampicillin-sulbactam (UNASYN) IV  3 g Intravenous Q8H  . enoxaparin (LOVENOX) injection  40 mg Subcutaneous Q24H  . famotidine  20 mg Oral Daily  . Influenza vac split quadrivalent PF  0.5 mL Intramuscular Tomorrow-1000  . vancomycin  750 mg Intravenous Q8H    Assessment/Plan:   1. Abscess and cellulitis of the  left leg, with pain in the left leg. Culture growing staph aureus with sensitivities to follow. Continue IV vancomycin. I will stop IV Unasyn. Follow-up with Dr. Marina Gravel as outpatient in 1 week. Likely I will discharge home tomorrow morning when I evaluated him. 2. History of hypertension but not on any medications for this and blood pressure is normal. 3. Gastroesophageal reflux disease- on famotidine 4. Headache- ibuprofen ordered  Code Status:     Code Status Orders        Start     Ordered   07/28/15 2327  Full code   Continuous     07/28/15 2326     Disposition Plan: Home tomorrow morning  Consultants:  General surgery  Antibiotics:  Vancomycin   Time spent: 22 minutes   Loletha Grayer  [facility]

## 2015-07-30 NOTE — Progress Notes (Signed)
ANTIBIOTIC CONSULT NOTE -FOLLOW UP   Pharmacy Consult for vancomycin Indication: cellulitis  No Known Allergies  Patient Measurements: Height: 5\' 10"  (177.8 cm) Weight: 130 lb 9.6 oz (59.24 kg) IBW/kg (Calculated) : 73 Adjusted Body Weight: 59.2 kg  Vital Signs: Temp: 98.2 F (36.8 C) (10/14 0753) Temp Source: Oral (10/14 0753) BP: 126/83 mmHg (10/14 0753) Pulse Rate: 50 (10/14 0753) Intake/Output from previous day: 10/13 0701 - 10/14 0700 In: 1700 [P.O.:1200; IV Piggyback:500] Out: 0  Intake/Output from this shift:    Labs:  Recent Labs  07/28/15 2040 07/29/15 0706  WBC 8.5 6.6  HGB 12.9* 12.0*  PLT 287 261  CREATININE 0.94 0.77   Estimated Creatinine Clearance: 81.2 mL/min (by C-G formula based on Cr of 0.77).  Recent Labs  07/30/15 0535  [facility] 12     Microbiology: Recent Results (from the past 720 hour(s))  Culture, blood (routine x 2)     Status: None (Preliminary result)   Collection Time: 07/28/15  8:44 PM  Result Value Ref Range Status   Specimen Description BLOOD RIGHT ARM  Final   Special Requests BOTTLES DRAWN AEROBIC AND ANAEROBIC 9ML  Final   Culture NO GROWTH 2 DAYS  Final   Report Status PENDING  Incomplete    Medical History: Past Medical History  Diagnosis Date  . Hypertension   . GERD (gastroesophageal reflux disease)   . Esophageal stricture   . Arthritis   . Skin cancer, basal cell     Medications:  Infusions:   Assessment: 59 yom cc leg pain with worsening cellulitis/abscess of LLE. Came to ED 6 days ago for boil, was given Bactrim, pain has worsened. Starting IV vancomycin for presumed staph.   Vd 41.4 L, Ke 0.062 hr, T1/2 11.2 hr, predicted trough 17 mcg/mL.  10/14: Vancomycin trough level drawn ~2 hours early resulted @ 12 mcg/ml. True trough likely close to 10.5.  Goal of Therapy:  Vancomycin trough level 15-20 mcg/ml  Plan:  Expected duration 5 days with resolution of temperature and/or normalization of WBC.  Will increase frequency to q8 hours. New order placed for Vancomycin 750 mg IV q8 hours. Will order Vancomycin trough level prior to the 08:00 dose on 10/15.   Vernica Wachtel D, Pharm.D. Clinical Pharmacist 07/30/2015,8:00 AM

## 2015-07-30 NOTE — Progress Notes (Signed)
Patient can follow up in my office next Friday.appt made.

## 2015-07-31 ENCOUNTER — Inpatient Hospital Stay: Payer: Self-pay

## 2015-07-31 LAB — VANCOMYCIN, TROUGH: VANCOMYCIN TR: 16 ug/mL (ref 10–20)

## 2015-07-31 MED ORDER — HYDROCODONE-ACETAMINOPHEN 5-325 MG PO TABS: 1.0000 | ORAL_TABLET | Freq: Four times a day (QID) | ORAL | Status: DC | PRN

## 2015-07-31 MED ORDER — DOXYCYCLINE HYCLATE 100 MG PO TABS: 100.0000 mg | ORAL_TABLET | Freq: Two times a day (BID) | ORAL | Status: DC

## 2015-07-31 NOTE — Progress Notes (Addendum)
Pt discharged via w/c  To home. Ct scan wnl . Pt with positive mrsa. literative  Given prior to discharge and explained in detail . Pt to take doxycycline per cultures. Dressing supplies provided to pt

## 2015-07-31 NOTE — Progress Notes (Signed)
Wound culture positive for mrsa per lab. Dr Leslye Peer notified . md will use doxycycline  For now. awainting cultures .still pending d/c after results of  Cat scan

## 2015-07-31 NOTE — Progress Notes (Signed)
Pt with positive mrsa. Per wound culture . Called to dr wieting who will look at cultures

## 2015-07-31 NOTE — Discharge Summary (Signed)
[place] [facility] [place] NAME: Kandon Hosking    MR#:  [id number]  DATE OF BIRTH:  July 25, 1956  DATE OF ADMISSION:  07/28/2015 ADMITTING PHYSICIAN: Lance Coon, MD  DATE OF DISCHARGE: 07/31/2015  PRIMARY CARE PHYSICIAN: No primary care provider on file.    ADMISSION DIAGNOSIS:  Cellulitis of left leg [L03.116]  DISCHARGE DIAGNOSIS:  Principal Problem:   Cellulitis and abscess of leg Active Problems:   HTN (hypertension)   GERD (gastroesophageal reflux disease)   Arthritis   SECONDARY DIAGNOSIS:   Past Medical History  Diagnosis Date  . Hypertension   . GERD (gastroesophageal reflux disease)   . Esophageal stricture   . Arthritis   . Skin cancer, basal cell     HOSPITAL COURSE:   1. Abscess and cellulitis of the left thigh. It started off as a small bump and then worsened with pain with walking and putting weight on his leg. Patient was started on IV vancomycin and Unasyn was added. Wound culture was done which showed staph aureus with sensitivities to follow. Patient's abscess improved and was draining on its own after Dr. Marina Gravel surgery removed the scab. He can follow-up with Dr. Felton Clinton next week. I will send home on oral doxycycline. Wound to stay covered while draining. Warm soaks will help. 2. History of hypertension but the blood pressure has been fine and he doesn't take any medications. 3. Gastroesophageal reflux disease without esophagitis 4. Frequent headaches- I will get a CT scan of the head prior to discharge. Can consider MRI of the brain as outpatient if CT scan negative.  DISCHARGE CONDITIONS:   Satisfactory  CONSULTS OBTAINED:  Gen. surgery  DRUG ALLERGIES:  No Known Allergies  DISCHARGE MEDICATIONS:   Current Discharge Medication List    START taking these medications   Details  doxycycline (VIBRA-TABS) 100 MG tablet Take 1 tablet (100 mg total) by mouth every 12 (twelve) hours. Qty: 20 tablet,  Refills: 0    HYDROcodone-acetaminophen (NORCO/VICODIN) 5-325 MG tablet Take 1 tablet by mouth every 6 (six) hours as needed (moderate pain). Qty: 20 tablet, Refills: 0      STOP taking these medications     sulfamethoxazole-trimethoprim (BACTRIM DS,SEPTRA DS) 800-160 MG tablet      traMADol (ULTRAM) 50 MG tablet          DISCHARGE INSTRUCTIONS:   Follow-up with Dr. Marina Gravel one week  If you experience worsening of your admission symptoms, develop shortness of breath, life threatening emergency, suicidal or homicidal thoughts you must seek medical attention immediately by calling 911 or calling your MD immediately  if symptoms less severe.  You Must read complete instructions/literature along with all the possible adverse reactions/side effects for all the Medicines you take and that have been prescribed to you. Take any new Medicines after you have completely understood and accept all the possible adverse reactions/side effects.   Please note  You were cared for by a hospitalist during your hospital stay. If you have any questions about your discharge medications or the care you received while you were in the hospital after you are discharged, you can call the unit and asked to speak with the hospitalist on call if the hospitalist that took care of you is not available. Once you are discharged, your primary care physician will handle any further medical issues. Please note that NO REFILLS for any discharge medications will be authorized once you are discharged, as it is imperative that you  return to your primary care physician (or establish a relationship with a primary care physician if you do not have one) for your aftercare needs so that they can reassess your need for medications and monitor your lab values.    Today   CHIEF COMPLAINT:   Chief Complaint  Patient presents with  . Leg Pain    HISTORY OF PRESENT ILLNESS:  Stockton Nunley  is a 59 y.o. male presented with leg  pain and found to have abscess and cellulitis of the left thigh.   VITAL SIGNS:  Blood pressure 123/75, pulse 49, temperature 98.2 F (36.8 C), temperature source Oral, resp. rate 18, height 5\' 10"  (1.778 m), weight 59.24 kg (130 lb 9.6 oz), SpO2 98 %.    PHYSICAL EXAMINATION:  GENERAL:  59 y.o.-year-old patient lying in the bed with no acute distress.  EYES: Pupils equal, round, reactive to light and accommodation. No scleral icterus. Extraocular muscles intact.  HEENT: Head atraumatic, normocephalic. Oropharynx and nasopharynx clear.  NECK:  Supple, no jugular venous distention. No thyroid enlargement, no tenderness.  LUNGS: Normal breath sounds bilaterally, no wheezing, rales,rhonchi or crepitation. No use of accessory muscles of respiration.  CARDIOVASCULAR: S1, S2 normal. No murmurs, rubs, or gallops.  ABDOMEN: Soft, non-tender, non-distended. Bowel sounds present. No organomegaly or mass.  EXTREMITIES: No pedal edema, cyanosis, or clubbing.  NEUROLOGIC: Cranial nerves II through XII are intact. Muscle strength 5/5 in all extremities. Sensation intact. Gait not checked.  PSYCHIATRIC: The patient is alert and oriented x 3.  SKIN: Left thigh abscess. Still draining a little bit when squeezing. Scab removed with a bandage. Erythema surrounding has settled down. Better range of motion left knee and less pain.  DATA REVIEW:   CBC  Recent Labs Lab 07/29/15 0706  WBC 6.6  HGB 12.0*  HCT 35.3*  PLT 261    Chemistries   Recent Labs Lab 07/29/15 0706  NA 135  K 4.1  CL 106  CO2 23  GLUCOSE 94  BUN 10  CREATININE 0.77  CALCIUM 8.2*     Microbiology Results  Results for orders placed or performed during the hospital encounter of 07/28/15  Culture, blood (routine x 2)     Status: None (Preliminary result)   Collection Time: 07/28/15  8:44 PM  Result Value Ref Range Status   Specimen Description BLOOD RIGHT ARM  Final   Special Requests BOTTLES DRAWN AEROBIC AND  ANAEROBIC 9ML  Final   Culture NO GROWTH 2 DAYS  Final   Report Status PENDING  Incomplete  Culture, blood (routine x 2)     Status: None (Preliminary result)   Collection Time: 07/29/15  8:44 AM  Result Value Ref Range Status   Specimen Description BLOOD LEFT FATTY CASTS  Final   Special Requests BOTTLES DRAWN AEROBIC AND ANAEROBIC  3CC  Final   Culture   Final    NO GROWTH 1 DAY Performed at [facility]    Report Status PENDING  Incomplete  Wound culture     Status: None (Preliminary result)   Collection Time: 07/29/15 10:23 AM  Result Value Ref Range Status   Specimen Description KNEE  Final   Special Requests LEFT KNEE ABSCESS  Final   Gram Stain RARE WBC SEEN FEW GRAM POSITIVE COCCI   Final   Culture   Final    MODERATE GROWTH STAPHYLOCOCCUS AUREUS SUSCEPTIBILITIES TO FOLLOW    Report Status PENDING  Incomplete    Management plans discussed with the  patient, and he is in agreement.  CODE STATUS:     Code Status Orders        Start     Ordered   07/28/15 2327  Full code   Continuous     07/28/15 2326      TOTAL TIME TAKING CARE OF THIS PATIENT: 35 minutes.    Loletha Grayer M.D on 07/31/2015 at 7:56 AM  Between 7am to 6pm - Pager - [phone]  After 6pm go to [email or web address] - password [email or web address] [facility]  [place] [facility]  Office  [phone]  CC: Primary care physician; No primary care provider on file.

## 2015-07-31 NOTE — Progress Notes (Signed)
ANTIBIOTIC CONSULT NOTE -FOLLOW UP   Pharmacy Consult for vancomycin Indication: cellulitis  No Known Allergies  Patient Measurements: Height: 5\' 10"  (177.8 cm) Weight: 130 lb 9.6 oz (59.24 kg) IBW/kg (Calculated) : 73 Adjusted Body Weight: 59.2 kg  Vital Signs: Temp: 98.2 F (36.8 C) (10/15 0552) Temp Source: Oral (10/15 0552) BP: 123/75 mmHg (10/15 0552) Pulse Rate: 49 (10/15 0552) Intake/Output from previous day: 10/14 0701 - 10/15 0700 In: 1590 [P.O.:840; IV Piggyback:750] Out: -  Intake/Output from this shift:    Labs:  Recent Labs  07/28/15 2040 07/29/15 0706  WBC 8.5 6.6  HGB 12.9* 12.0*  PLT 287 261  CREATININE 0.94 0.77   Estimated Creatinine Clearance: 81.2 mL/min (by C-G formula based on Cr of 0.77).  Recent Labs  07/30/15 0535 07/31/15 0720  [place] 12 16     Microbiology: Recent Results (from the past 720 hour(s))  Culture, blood (routine x 2)     Status: None (Preliminary result)   Collection Time: 07/28/15  8:44 PM  Result Value Ref Range Status   Specimen Description BLOOD RIGHT ARM  Final   Special Requests BOTTLES DRAWN AEROBIC AND ANAEROBIC 9ML  Final   Culture NO GROWTH 2 DAYS  Final   Report Status PENDING  Incomplete  Culture, blood (routine x 2)     Status: None (Preliminary result)   Collection Time: 07/29/15  8:44 AM  Result Value Ref Range Status   Specimen Description BLOOD LEFT FATTY CASTS  Final   Special Requests BOTTLES DRAWN AEROBIC AND ANAEROBIC  3CC  Final   Culture   Final    NO GROWTH 1 DAY Performed at [facility]    Report Status PENDING  Incomplete  Wound culture     Status: None (Preliminary result)   Collection Time: 07/29/15 10:23 AM  Result Value Ref Range Status   Specimen Description KNEE  Final   Special Requests LEFT KNEE ABSCESS  Final   Gram Stain RARE WBC SEEN FEW GRAM POSITIVE COCCI   Final   Culture   Final    MODERATE GROWTH STAPHYLOCOCCUS AUREUS SUSCEPTIBILITIES TO FOLLOW    Report Status PENDING  Incomplete    Medical History: Past Medical History  Diagnosis Date  . Hypertension   . GERD (gastroesophageal reflux disease)   . Esophageal stricture   . Arthritis   . Skin cancer, basal cell     Medications:  Infusions:   Assessment: 59 yom cc leg pain with worsening cellulitis/abscess of LLE. Came to ED 6 days ago for boil, was given Bactrim, pain has worsened. Starting IV vancomycin for presumed staph.   Vd 41.4 L, Ke 0.062 hr, T1/2 11.2 hr, predicted trough 17 mcg/mL.  10/14: Vancomycin trough level drawn ~2 hours early resulted @ 12 mcg/ml. True trough likely close to 10.5.  10/15: Vanc trough= 16 mcg/ml.  Goal of Therapy:  Vancomycin trough level 15-20 mcg/ml  Plan:  Expected duration 5 days with resolution of temperature and/or normalization of WBC. Will increase frequency to q8 hours. New order placed for Vancomycin 750 mg IV q8 hours. Will order Vancomycin trough level prior to the 08:00 dose on 10/15.   10/15: Continue current regimen  Leiann Sporer A, Pharm.D. Clinical Pharmacist 07/31/2015,8:26 AM

## 2015-08-01 LAB — WOUND CULTURE

## 2015-08-02 LAB — CULTURE, BLOOD (ROUTINE X 2): CULTURE: NO GROWTH

## 2015-08-03 LAB — CULTURE, BLOOD (ROUTINE X 2): CULTURE: NO GROWTH

## 2016-07-29 DIAGNOSIS — R079 Chest pain, unspecified: Secondary | ICD-10-CM | POA: Insufficient documentation

## 2016-08-01 DIAGNOSIS — F1110 Opioid abuse, uncomplicated: Secondary | ICD-10-CM | POA: Insufficient documentation

## 2016-12-06 ENCOUNTER — Encounter (INDEPENDENT_AMBULATORY_CARE_PROVIDER_SITE_OTHER): Payer: Self-pay | Admitting: Ophthalmology

## 2017-12-09 ENCOUNTER — Emergency Department
Admission: EM | Admit: 2017-12-09 | Discharge: 2017-12-09 | Disposition: A | Discharge status: Home / Self Care | Payer: Self-pay | Attending: Emergency Medicine | Admitting: Emergency Medicine

## 2017-12-09 ENCOUNTER — Emergency Department: Payer: Self-pay

## 2017-12-09 ENCOUNTER — Encounter: Payer: Self-pay | Admitting: Intensive Care

## 2017-12-09 DIAGNOSIS — Z87891 Personal history of nicotine dependence: Secondary | ICD-10-CM | POA: Insufficient documentation

## 2017-12-09 DIAGNOSIS — Z85828 Personal history of other malignant neoplasm of skin: Secondary | ICD-10-CM | POA: Insufficient documentation

## 2017-12-09 DIAGNOSIS — I10 Essential (primary) hypertension: Secondary | ICD-10-CM | POA: Insufficient documentation

## 2017-12-09 DIAGNOSIS — J101 Influenza due to other identified influenza virus with other respiratory manifestations: Secondary | ICD-10-CM | POA: Insufficient documentation

## 2017-12-09 DIAGNOSIS — Z96669 Presence of unspecified artificial ankle joint: Secondary | ICD-10-CM | POA: Insufficient documentation

## 2017-12-09 LAB — BASIC METABOLIC PANEL
Anion gap: 14 (ref 5–15)
BUN: 21 mg/dL — ABNORMAL HIGH (ref 6–20)
CHLORIDE: 100 mmol/L — AB (ref 101–111)
CO2: 20 mmol/L — ABNORMAL LOW (ref 22–32)
CREATININE: 1.06 mg/dL (ref 0.61–1.24)
Calcium: 8.5 mg/dL — ABNORMAL LOW (ref 8.9–10.3)
GFR calc Af Amer: >60 mL/min (ref >60)
GFR calc non Af Amer: >60 mL/min (ref >60)
GLUCOSE: 107 mg/dL — AB (ref 65–99)
Potassium: 3.9 mmol/L (ref 3.5–5.1)
SODIUM: 134 mmol/L — AB (ref 135–145)

## 2017-12-09 LAB — TROPONIN I: Troponin I: <0.03 ng/mL (ref <0.03)

## 2017-12-09 LAB — CBC
HCT: 44 % (ref 40.0–52.0)
Hemoglobin: 14.8 g/dL (ref 13.0–18.0)
MCH: 29.9 pg (ref 26.0–34.0)
MCHC: 33.6 g/dL (ref 32.0–36.0)
MCV: 88.8 fL (ref 80.0–100.0)
PLATELETS: 238 10*3/uL (ref 150–440)
RBC: 4.96 MIL/uL (ref 4.40–5.90)
RDW: 13 % (ref 11.5–14.5)
WBC: 5.5 10*3/uL (ref 3.8–10.6)

## 2017-12-09 LAB — URINALYSIS, COMPLETE (UACMP) WITH MICROSCOPIC
BACTERIA UA: NONE SEEN
BILIRUBIN URINE: NEGATIVE
Glucose, UA: NEGATIVE mg/dL
Ketones, ur: 80 mg/dL — AB
LEUKOCYTES UA: NEGATIVE
NITRITE: NEGATIVE
PH: 5 (ref 5.0–8.0)
Protein, ur: NEGATIVE mg/dL
SPECIFIC GRAVITY, URINE: 1.023 (ref 1.005–1.030)

## 2017-12-09 LAB — INFLUENZA PANEL BY PCR (TYPE A & B)
INFLBPCR: NEGATIVE
Influenza A By PCR: POSITIVE — AB

## 2017-12-09 LAB — LACTIC ACID, PLASMA: Lactic Acid, Venous: 1.8 mmol/L (ref 0.5–1.9)

## 2017-12-09 MED ORDER — SODIUM CHLORIDE 0.9 % IV BOLUS (SEPSIS)
1000.0000 mL | Freq: Once | INTRAVENOUS | Status: AC
  Administered 2017-12-09: 1000 mL via INTRAVENOUS

## 2017-12-09 MED ORDER — ACETAMINOPHEN 500 MG PO TABS
1000.0000 mg | ORAL_TABLET | Freq: Once | ORAL | Status: AC
  Administered 2017-12-09: 1000 mg via ORAL
  Filled 2017-12-09: qty 2

## 2017-12-09 MED ORDER — ONDANSETRON HCL 4 MG PO TABS: 4.0000 mg | ORAL_TABLET | Freq: Three times a day (TID) | ORAL | 0 refills | Status: DC | PRN

## 2017-12-09 MED ORDER — ONDANSETRON HCL 4 MG/2ML IJ SOLN
4.0000 mg | Freq: Once | INTRAMUSCULAR | Status: AC
  Administered 2017-12-09: 4 mg via INTRAVENOUS
  Filled 2017-12-09: qty 2

## 2017-12-09 NOTE — ED Notes (Signed)
Pt belching and states that he still has a headache.  Pt states "I feel awful".  Pt requested that this RN wet washcloth for his forehead, done at this time.  Pt groaning frequently at this time.

## 2017-12-09 NOTE — ED Notes (Signed)
Pt discharged to home.  Family member driving.  Discharge instructions reviewed.  Verbalized understanding.  No questions or concerns at this time.  Teach back verified.  Pt in NAD.  No items left in ED.   

## 2017-12-09 NOTE — Discharge Instructions (Signed)
You are evaluated and found to have the flu.  Return to the emergency department immediately for any worsening condition including concern for dehydration, decreased urine output, dry mouth, dizziness, palpitations, chest pain, shortness of breath or trouble breathing, or any other symptoms concerning to you.

## 2017-12-09 NOTE — ED Notes (Signed)
Pt c/o chest pressure since Friday when EMS arrived at his home. Pt denies chest pain/pressure upon arrival to ER

## 2017-12-09 NOTE — ED Triage Notes (Signed)
Arrived from home by EMS. Patient c/o emesis, chills/sweats, and L sided chest pressure since Friday. Patient denies any chest pain upon arrival to ER. States "I havent been out of bed since Friday" EMS Blood sugar 118.

## 2017-12-09 NOTE — ED Provider Notes (Signed)
[facility] Emergency Department Provider Note ____________________________________________   I have reviewed the triage vital signs and the triage nursing note.  HISTORY  Chief Complaint Emesis and Chest Pain   Historian Patient  HPI Johnathan Harvey is a 62 y.o. male presenting by EMS for 2 days of fatigue, cough, decreased po intake, some nonbloody vomiting.  No diarrhea.  No abdominal pain.  Decreased urine output.  Thinks might of had a fever.  Felt like he was too weak to drive here today so he came by EMS.  With EMS he had an IV stick which hit the artery in the right antecubital fossa and was requiring pressure for hemostatic control.  Symptoms are moderate and nothing makes it worse or better.   Past Medical History:  Diagnosis Date  . Arthritis   . Esophageal stricture   . GERD (gastroesophageal reflux disease)   . Hypertension   . Skin cancer, basal cell     Patient Active Problem List   Diagnosis Date Noted  . Cellulitis and abscess of leg 07/28/2015  . HTN (hypertension) 07/28/2015  . GERD (gastroesophageal reflux disease) 07/28/2015  . Arthritis 07/28/2015  . Opiate overdose ([place]) 05/02/2015    Past Surgical History:  Procedure Laterality Date  . ANKLE ARTHROPLASTY      Prior to Admission medications   Medication Sig Start Date End Date Taking? Authorizing Provider  doxycycline (VIBRA-TABS) 100 MG tablet Take 1 tablet (100 mg total) by mouth every 12 (twelve) hours. Patient not taking: Reported on 12/09/2017 07/31/15   Loletha Grayer, MD  HYDROcodone-acetaminophen (NORCO/VICODIN) 5-325 MG tablet Take 1 tablet by mouth every 6 (six) hours as needed (moderate pain). Patient not taking: Reported on 12/09/2017 07/31/15   Loletha Grayer, MD    No Known Allergies  Family History  Problem Relation Age of Onset  . Cancer Mother   . Cancer Father     Social History Social History   Tobacco Use  . Smoking status:  Former Smoker    Packs/day: 0.00  . Smokeless tobacco: Never Used  Substance Use Topics  . Alcohol use: No    Alcohol/week: 0.0 oz  . Drug use: Yes    Types: Marijuana    Comment: Opiate positive    Review of Systems  Constitutional: Questionable for subjective fever. Eyes: Negative for visual changes. ENT: Negative for sore throat.  Positive for nasal congestion. Cardiovascular: Negative for chest pain. Respiratory: Negative for shortness of breath.  Positive for cough. Gastrointestinal: Positive for vomiting as per HPI.Marland Kitchen Genitourinary: Negative for dysuria. Musculoskeletal: Negative for back pain. Skin: Negative for rash. Neurological: Negative for headache.  ____________________________________________   PHYSICAL EXAM:  VITAL SIGNS: ED Triage Vitals  Enc Vitals Group     BP 12/09/17 0711 (!) 143/99     Pulse Rate 12/09/17 0711 71     Resp 12/09/17 0711 12     Temp 12/09/17 0711 98.5 F (36.9 C)     Temp Source 12/09/17 0711 Oral     SpO2 12/09/17 0711 97 %     Weight 12/09/17 0712 130 lb (59 kg)     Height 12/09/17 0712 5\' 10"  (1.778 m)     Head Circumference --      Peak Flow --      Pain Score --      Pain Loc --      Pain Edu? --      Excl. in [place]? --      Constitutional:  Alert and oriented. Well appearing and in no distress. HEENT   Head: Normocephalic and atraumatic.      Eyes: Conjunctivae are normal. Pupils equal and round.       Ears:         Nose: No congestion/rhinnorhea.   Mouth/Throat: Mucous membranes are moderately dry.   Neck: No stridor. Cardiovascular/Chest: Normal rate, regular rhythm.  No murmurs, rubs, or gallops. Respiratory: Normal respiratory effort without tachypnea nor retractions. Breath sounds are clear and equal bilaterally.  Moderate rhonchi posterior bases bilaterally.   Gastrointestinal: Soft. No distention, no guarding, no rebound. Nontender.    Genitourinary/rectal:  Deferred Musculoskeletal: Nontender with  normal range of motion in all extremities. No joint effusions.  No lower extremity tenderness.  No edema. Neurologic:  Normal speech and language. No gross or focal neurologic deficits are appreciated. Skin:  Skin is warm, dry and intact. No rash noted. Psychiatric: Mood and affect are normal. Speech and behavior are normal. Patient exhibits appropriate insight and judgment.   ____________________________________________  LABS (pertinent positives/negatives) I, Lisa Roca, MD the attending physician have reviewed the labs noted below.  Labs Reviewed  BASIC METABOLIC PANEL - Abnormal; Notable for the following components:      Result Value   Sodium 134 (*)    Chloride 100 (*)    CO2 20 (*)    Glucose, Bld 107 (*)    BUN 21 (*)    Calcium 8.5 (*)    All other components within normal limits  INFLUENZA PANEL BY PCR (TYPE A & B) - Abnormal; Notable for the following components:   Influenza A By PCR POSITIVE (*)    All other components within normal limits  URINALYSIS, COMPLETE (UACMP) WITH MICROSCOPIC - Abnormal; Notable for the following components:   Color, Urine YELLOW (*)    APPearance CLEAR (*)    Hgb urine dipstick SMALL (*)    Ketones, ur 80 (*)    Squamous Epithelial / LPF 0-5 (*)    All other components within normal limits  CULTURE, BLOOD (ROUTINE X 2)  CULTURE, BLOOD (ROUTINE X 2)  CBC  TROPONIN I  LACTIC ACID, PLASMA    ____________________________________________    EKG I, Lisa Roca, MD, the attending physician have personally viewed and interpreted all ECGs.  72 bpm.  Normal sinus rhythm.  Narrow QRS.  Normal axis.  Normal ST and T wave ____________________________________________  RADIOLOGY   Chest x-ray reviewed by me: No acute finding Radiologist report:IMPRESSION: No active cardiopulmonary disease. __________________________________________  PROCEDURES  Procedure(s) performed: None  Critical Care performed:  None   ____________________________________________  ED COURSE / ASSESSMENT AND PLAN  Pertinent labs & imaging results that were available during my care of the patient were reviewed by me and considered in my medical decision making (see chart for details).    Patient without unstable vital signs, but looks that he feels puny and somewhat clinically dehydrated.  Influenza did come back positive.  Chest x-ray is clear for no pneumonia.  I discussed influenza positive with the patient, and whether or not to treat with Tamiflu.  He is not meeting any high risk criteria, we discussed risk and benefit, and he chose to hold off and I think this is completely reasonable.  We discussed return precautions.  DIFFERENTIAL DIAGNOSIS: Including but not limited to pneumonia, viral upper respiratory infection, dehydration, electrolyte disturbance, influenza, urinary tract infection, etc.  CONSULTATIONS:   None   Patient / Family / Caregiver informed of clinical  course, medical decision-making process, and agree with plan.   I discussed return precautions, follow-up instructions, and discharge instructions with patient and/or family.  Discharge Instructions : You are evaluated and found to have the flu.  Return to the emergency department immediately for any worsening condition including concern for dehydration, decreased urine output, dry mouth, dizziness, palpitations, chest pain, shortness of breath or trouble breathing, or any other symptoms concerning to you.    ___________________________________________   FINAL CLINICAL IMPRESSION(S) / ED DIAGNOSES   Final diagnoses:  Influenza A      ___________________________________________        Note: This dictation was prepared with Dragon dictation. Any transcriptional errors that result from this process are unintentional    Lisa Roca, MD 12/09/17 1231

## 2017-12-09 NOTE — ED Notes (Signed)
ED Provider at bedside. 

## 2017-12-14 LAB — CULTURE, BLOOD (ROUTINE X 2)
Culture: NO GROWTH
Culture: NO GROWTH
Special Requests: ADEQUATE

## 2018-11-04 ENCOUNTER — Emergency Department: Payer: Self-pay | Admitting: Anesthesiology

## 2018-11-04 ENCOUNTER — Other Ambulatory Visit: Payer: Self-pay

## 2018-11-04 ENCOUNTER — Emergency Department
Admission: EM | Admit: 2018-11-04 | Discharge: 2018-11-05 | Disposition: A | Discharge status: Home / Self Care | Payer: Self-pay | Attending: Gastroenterology | Admitting: Gastroenterology

## 2018-11-04 ENCOUNTER — Encounter
Admission: EM | Disposition: A | Discharge status: Home / Self Care | Payer: Self-pay | Source: Home / Self Care | Attending: Emergency Medicine

## 2018-11-04 ENCOUNTER — Emergency Department: Payer: Self-pay

## 2018-11-04 DIAGNOSIS — X58XXXA Exposure to other specified factors, initial encounter: Secondary | ICD-10-CM | POA: Insufficient documentation

## 2018-11-04 DIAGNOSIS — Z87891 Personal history of nicotine dependence: Secondary | ICD-10-CM | POA: Insufficient documentation

## 2018-11-04 DIAGNOSIS — I10 Essential (primary) hypertension: Secondary | ICD-10-CM | POA: Insufficient documentation

## 2018-11-04 DIAGNOSIS — K222 Esophageal obstruction: Secondary | ICD-10-CM | POA: Insufficient documentation

## 2018-11-04 DIAGNOSIS — T18128A Food in esophagus causing other injury, initial encounter: Secondary | ICD-10-CM | POA: Insufficient documentation

## 2018-11-04 HISTORY — PX: ESOPHAGOGASTRODUODENOSCOPY: SHX5428

## 2018-11-04 SURGERY — EGD (ESOPHAGOGASTRODUODENOSCOPY)
Anesthesia: General | Site: Mouth | Wound class: Dirty or Infected

## 2018-11-04 MED ORDER — SUCCINYLCHOLINE CHLORIDE 20 MG/ML IJ SOLN
INTRAMUSCULAR | Status: DC | PRN
  Administered 2018-11-04: 80 mg via INTRAVENOUS

## 2018-11-04 MED ORDER — GLUCAGON HCL (RDNA) 1 MG IJ SOLR
1.0000 mg | Freq: Once | INTRAMUSCULAR | Status: AC
Start: 2018-11-04 — End: 2018-11-04
  Administered 2018-11-04: 1 mg via INTRAMUSCULAR
  Filled 2018-11-04 (×2): qty 1

## 2018-11-04 MED ORDER — LACTATED RINGERS IV SOLN
INTRAVENOUS | Status: DC | PRN
  Administered 2018-11-04: via INTRAVENOUS

## 2018-11-04 MED ORDER — PROPOFOL 10 MG/ML IV BOLUS
INTRAVENOUS | Status: DC | PRN
  Administered 2018-11-04: 50 mg via INTRAVENOUS
  Administered 2018-11-04: 150 mg via INTRAVENOUS

## 2018-11-04 MED ORDER — PROPOFOL 10 MG/ML IV BOLUS
INTRAVENOUS | Status: AC
  Filled 2018-11-04: qty 20

## 2018-11-04 MED ORDER — SUCCINYLCHOLINE CHLORIDE 20 MG/ML IJ SOLN
INTRAMUSCULAR | Status: AC
  Filled 2018-11-04: qty 1

## 2018-11-04 NOTE — ED Notes (Signed)
Pt states he is unable to swallow saliva. Pt having to spit into emesis bag. Pt is alert and talking in complete sentences at this time. Pt's respirations even and unlabored at this time.

## 2018-11-04 NOTE — ED Notes (Signed)
Report given to [place] in [place]
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
## 2018-11-04 NOTE — ED Triage Notes (Signed)
Pt arrives to ED via POV from home with c/o a piece of meat stuck in his throat since 5pm tonight. Pt reports h/x of same swallowing issues. Pt denies difficulty breathing or speaking, does report being unable to swallow saliva and keeps spitting oral secretions into emesis bag.

## 2018-11-04 NOTE — ED Provider Notes (Addendum)
[facility] Emergency Department Provider Note ____________________________________________   First MD Initiated Contact with Patient 11/04/18 2111     (approximate)  I have reviewed the triage vital signs and the nursing notes.   HISTORY  Chief Complaint Dysphagia and Swallowed Foreign Body    HPI Johnathan Harvey is a 63 y.o. male with PMH as noted below as well as a history of esophageal food impaction in the past who presents with lower chest/epigastric discomfort after eating steak, similar to prior impaction.  The patient states that he felt the discomfort very soon after starting to eat the steak.  He states that when he tries to eat any solids or drink liquids, they come right back up.  He denies any shortness of breath or other acute symptoms.  Past Medical History:  Diagnosis Date  . Arthritis   . Esophageal stricture   . GERD (gastroesophageal reflux disease)   . Hypertension   . Skin cancer, basal cell     Patient Active Problem List   Diagnosis Date Noted  . Cellulitis and abscess of leg 07/28/2015  . HTN (hypertension) 07/28/2015  . GERD (gastroesophageal reflux disease) 07/28/2015  . Arthritis 07/28/2015  . Opiate overdose ([place]) 05/02/2015    Past Surgical History:  Procedure Laterality Date  . ANKLE ARTHROPLASTY      Prior to Admission medications   Medication Sig Start Date End Date Taking? Authorizing Provider  doxycycline (VIBRA-TABS) 100 MG tablet Take 1 tablet (100 mg total) by mouth every 12 (twelve) hours. Patient not taking: Reported on 12/09/2017 07/31/15   Loletha Grayer, MD  HYDROcodone-acetaminophen (NORCO/VICODIN) 5-325 MG tablet Take 1 tablet by mouth every 6 (six) hours as needed (moderate pain). Patient not taking: Reported on 12/09/2017 07/31/15   Loletha Grayer, MD  ondansetron (ZOFRAN) 4 MG tablet Take 1 tablet (4 mg total) by mouth every 8 (eight) hours as needed for nausea or vomiting. 12/09/17   Lisa Roca, MD    Allergies Patient has no known allergies.  Family History  Problem Relation Age of Onset  . Cancer Mother   . Cancer Father     Social History Social History   Tobacco Use  . Smoking status: Former Smoker    Packs/day: 0.00  . Smokeless tobacco: Never Used  Substance Use Topics  . Alcohol use: No    Alcohol/week: 0.0 standard drinks  . Drug use: Yes    Types: Marijuana    Comment: Opiate positive    Review of Systems  Constitutional: No fever. Eyes: No redness. ENT: No sore throat. Cardiovascular: Denies chest pain. Respiratory: Denies shortness of breath. Gastrointestinal: Positive for regurgitation.  Genitourinary: Negative for flank pain.  Musculoskeletal: Negative for back pain. Skin: Negative for rash. Neurological: Negative for headache.   ____________________________________________   PHYSICAL EXAM:  VITAL SIGNS: ED Triage Vitals  Enc Vitals Group     BP 11/04/18 2100 125/86     Pulse Rate 11/04/18 2100 67     Resp 11/04/18 2100 17     Temp 11/04/18 2100 98.8 F (37.1 C)     Temp Source 11/04/18 2100 Oral     SpO2 11/04/18 2100 97 %     Weight 11/04/18 2058 135 lb (61.2 kg)     Height 11/04/18 2058 5\' 10"  (1.778 m)     Head Circumference --      Peak Flow --      Pain Score 11/04/18 2058 0     Pain  Loc --      Pain Edu? --      Excl. in [place]? --     Constitutional: Alert and oriented. Well appearing and in no acute distress. Eyes: Conjunctivae are normal.  Head: Atraumatic. Nose: No congestion/rhinnorhea. Mouth/Throat: Mucous membranes are moist.   Neck: Normal range of motion.  Cardiovascular: Normal rate, regular rhythm. Grossly normal heart sounds.  Good peripheral circulation. Respiratory: Normal respiratory effort.  No retractions. Lungs CTAB. Gastrointestinal: Soft and nontender. No distention.  Musculoskeletal:   Extremities warm and well perfused.  Neurologic:  Normal speech and language. No gross focal neurologic  deficits are appreciated.  Skin:  Skin is warm and dry. No rash noted. Psychiatric: Mood and affect are normal. Speech and behavior are normal.  ____________________________________________   LABS (all labs ordered are listed, but only abnormal results are displayed)  Labs Reviewed - No data to display ____________________________________________  EKG   ____________________________________________  RADIOLOGY  CXR: No acute abnormalities  ____________________________________________   PROCEDURES  Procedure(s) performed: No  Procedures  Critical Care performed: No ____________________________________________   INITIAL IMPRESSION / ASSESSMENT AND PLAN / ED COURSE  Pertinent labs & imaging results that were available during my care of the patient were reviewed by me and considered in my medical decision making (see chart for details).  63 year old male with PMH as noted above presents with lower chest/upper abdominal pain and regurgitation of solids and liquids after eating steak, similar to prior esophageal impaction.  On exam the patient is overall relatively comfortable appearing and his vital signs are normal.  The remainder of the exam is unremarkable.  Presentation is consistent with esophageal food impaction.  I will give IM glucagon and obtain a chest x-ray.  If the patient's symptoms continue he will likely need GI consult.  ----------------------------------------- 10:57 PM on 11/04/2018 -----------------------------------------  Patient has had no relief after IM glucagon.  I gave him ginger ale but he regurgitated it.  He will require GI consultation for likely endoscopy.  I consulted Dr. Marius Ditch from GI who will evaluate the patient.  ----------------------------------------- 11:45 PM on 11/04/2018 -----------------------------------------  Patient left the ED for endoscopy. ____________________________________________   FINAL CLINICAL IMPRESSION(S) /  ED DIAGNOSES  Final diagnoses:  Esophageal obstruction due to food impaction      NEW MEDICATIONS STARTED DURING THIS VISIT:  New Prescriptions   No medications on file     Note:  This document was prepared using Dragon voice recognition software and may include unintentional dictation errors.   Arta Silence, MD 11/04/18 2322    Arta Silence, MD 11/05/18 1013

## 2018-11-04 NOTE — ED Notes (Signed)
Report given to Cole RN 

## 2018-11-04 NOTE — Anesthesia Preprocedure Evaluation (Signed)
Anesthesia Evaluation  Patient identified by MRN, date of birth, ID band Patient awake    Reviewed: Allergy & Precautions, H&P , NPO status , Patient's Chart, lab work & pertinent test results, reviewed documented beta blocker date and time   History of Anesthesia Complications Negative for: history of anesthetic complications  Airway Mallampati: I  TM Distance: >3 FB Neck ROM: full    Dental  (+) Dental Advidsory Given, Poor Dentition, Missing, Partial Upper   Pulmonary neg pulmonary ROS, former smoker,           Cardiovascular Exercise Tolerance: Good hypertension, (-) angina(-) CAD, (-) Past MI, (-) Cardiac Stents and (-) CABG (-) dysrhythmias (-) Valvular Problems/Murmurs     Neuro/Psych negative neurological ROS  negative psych ROS   GI/Hepatic Neg liver ROS, GERD  ,  Endo/Other  negative endocrine ROS  Renal/GU negative Renal ROS  negative genitourinary   Musculoskeletal   Abdominal   Peds  Hematology negative hematology ROS (+)   Anesthesia Other Findings Past Medical History: No date: Arthritis No date: Esophageal stricture No date: GERD (gastroesophageal reflux disease) No date: Hypertension No date: Skin cancer, basal cell  Patient has "meat stuck in his throat" and is presenting for EGD with foreign body removal.  No prior problems with anesthesia and has had this procedure in the past.  Given the emergency nature of the procedure and patient not being NPO we will proceed with an RSI and plan to extubate at the end.  Reproductive/Obstetrics negative OB ROS                             Anesthesia Physical Anesthesia Plan  ASA: II and emergent  Anesthesia Plan: General   Post-op Pain Management:    Induction: Intravenous, Rapid sequence and Cricoid pressure planned  PONV Risk Score and Plan: 2 and Ondansetron, Dexamethasone and Treatment may vary due to age or medical  condition  Airway Management Planned: Oral ETT  Additional Equipment:   Intra-op Plan:   Post-operative Plan: Extubation in OR  Informed Consent: I have reviewed the patients History and Physical, chart, labs and discussed the procedure including the risks, benefits and alternatives for the proposed anesthesia with the patient or authorized representative who has indicated his/her understanding and acceptance.     Dental Advisory Given  Plan Discussed with: Anesthesiologist, CRNA and Surgeon  Anesthesia Plan Comments:         Anesthesia Quick Evaluation

## 2018-11-04 NOTE — ED Notes (Signed)
Pt transported to OR at this time  

## 2018-11-04 NOTE — Consult Note (Signed)
Cephas Darby, MD [address]  [place], [place] [address]  Main: [phone]  Fax: [phone] Pager: [phone]   Consultation  Referring Provider:     No ref. provider found Primary Care Physician:  Patient, No Pcp Per Primary Gastroenterologist:  Dr. Vincenza Hews at [facility]         Reason for Consultation:     Food impaction  Date of Admission:  11/04/2018 Date of Consultation:  11/04/2018         HPI:   Johnathan Harvey is a 63 y.o. male with known history of esophageal stricture, undergoing serial dilations at [facility], most recently in 03/2018, dilated to 18 mm who ate a piece of meat that got stuck in his throat since 5 PM.  He denies shortness of breath but unable to swallow saliva and has been spitting oral secretions.  Patient received glucagon in the ER with no relief.  Therefore, GI is consulted at 11 PM for EGD.  Patient denies chest pain.  He reports taking omeprazole as outpatient.  He reports having similar food impactions in the past  NSAIDs: None  Antiplts/Anticoagulants/Anti thrombotics: None  GI Procedures: Several EGDs performed at [facility] for esophageal stricture and dilation  Past Medical History:  Diagnosis Date  . Arthritis   . Esophageal stricture   . GERD (gastroesophageal reflux disease)   . Hypertension   . Skin cancer, basal cell     Past Surgical History:  Procedure Laterality Date  . ANKLE ARTHROPLASTY     No current facility-administered medications for this encounter.   Current Outpatient Medications:  .  doxycycline (VIBRA-TABS) 100 MG tablet, Take 1 tablet (100 mg total) by mouth every 12 (twelve) hours. (Patient not taking: Reported on 12/09/2017), Disp: 20 tablet, Rfl: 0 .  HYDROcodone-acetaminophen (NORCO/VICODIN) 5-325 MG tablet, Take 1 tablet by mouth every 6 (six) hours as needed (moderate pain). (Patient not taking: Reported on 12/09/2017), Disp: 20 tablet, Rfl: 0 .  ondansetron (ZOFRAN) 4 MG tablet, Take 1 tablet (4  mg total) by mouth every 8 (eight) hours as needed for nausea or vomiting. (Patient not taking: Reported on 11/04/2018), Disp: 10 tablet, Rfl: 0  Facility-Administered Medications Ordered in Other Encounters:  .  lactated ringers infusion, , , Continuous PRN, Justis, Denise, CRNA    Family History  Problem Relation Age of Onset  . Cancer Mother   . Cancer Father      Social History   Tobacco Use  . Smoking status: Former Smoker    Packs/day: 0.00  . Smokeless tobacco: Never Used  Substance Use Topics  . Alcohol use: No    Alcohol/week: 0.0 standard drinks  . Drug use: Yes    Types: Marijuana    Comment: Opiate positive    Allergies as of 11/04/2018  . (No Known Allergies)    Review of Systems:    All systems reviewed and negative except where noted in HPI.   Physical Exam:  Vital signs in last 24 hours: Temp:  [98.8 F (37.1 C)] 98.8 F (37.1 C) (01/20 2100) Pulse Rate:  [58-68] 60 (01/20 2259) Resp:  [17-18] 18 (01/20 2258) BP: (107-125)/(82-90) 107/90 (01/20 2258) SpO2:  [96 %-97 %] 97 % (01/20 2259) Weight:  [61.2 kg] 61.2 kg (01/20 2058)   General:   Pleasant, cooperative in NAD Head:  Normocephalic and atraumatic. Eyes:   No icterus.   Conjunctiva pink. PERRLA. Ears:  Normal auditory acuity. Neck:  Supple;  no masses or thyroidomegaly Lungs: Respirations even and unlabored. Lungs clear to auscultation bilaterally.   No wheezes, crackles, or rhonchi.  Heart:  Regular rate and rhythm;  Without murmur, clicks, rubs or gallops Abdomen:  Soft, nondistended, nontender. Normal bowel sounds. No appreciable masses or hepatomegaly.  No rebound or guarding.  Rectal:  Not performed. Msk:  Symmetrical without gross deformities.  Strength normal Extremities:  Without edema, cyanosis or clubbing. Neurologic:  Alert and oriented x3;  grossly normal neurologically. Psych:  Alert and cooperative. Normal affect.  LAB RESULTS: CBC Latest Ref Rng & Units 12/09/2017 07/29/2015  07/28/2015  WBC 3.8 - 10.6 K/uL 5.5 6.6 8.5  Hemoglobin 13.0 - 18.0 g/dL 14.8 12.0(L) 12.9(L)  Hematocrit 40.0 - 52.0 % 44.0 35.3(L) 37.8(L)  Platelets 150 - 440 K/uL 238 261 287    BMET BMP Latest Ref Rng & Units 12/09/2017 07/29/2015 07/28/2015  Glucose 65 - 99 mg/dL 107(H) 94 85  BUN 6 - 20 mg/dL 21(H) 10 14  Creatinine 0.61 - 1.24 mg/dL 1.06 0.77 0.94  Sodium 135 - 145 mmol/L 134(L) 135 137  Potassium 3.5 - 5.1 mmol/L 3.9 4.1 3.7  Chloride 101 - 111 mmol/L 100(L) 106 101  CO2 22 - 32 mmol/L 20(L) 23 28  Calcium 8.9 - 10.3 mg/dL 8.5(L) 8.2(L) 8.9    LFT Hepatic Function Latest Ref Rng & Units 05/02/2015  Total Protein 6.5 - 8.1 g/dL 6.7  Albumin 3.5 - 5.0 g/dL 3.7  AST 15 - 41 U/L 20  ALT 17 - 63 U/L 17  Alk Phosphatase 38 - 126 U/L 52  Total Bilirubin 0.3 - 1.2 mg/dL 1.5(H)     STUDIES: Dg Chest 2 View  Result Date: 11/04/2018 CLINICAL DATA:  Lower chest pain EXAM: CHEST - 2 VIEW COMPARISON:  None. FINDINGS: The heart size and mediastinal contours are within normal limits. Both lungs are clear. The visualized skeletal structures are unremarkable. IMPRESSION: No active cardiopulmonary disease. Electronically Signed   By: Ulyses Jarred M.D.   On: 11/04/2018 22:02      Impression / Plan:   Johnathan Harvey is a 63 y.o. Caucasian male with esophageal stricture, presented with food impaction  Perform urgent EGD tonight Continue PPI twice daily  I have discussed alternative options, risks & benefits,  which include, but are not limited to, bleeding, infection, perforation,respiratory complication & drug reaction.  The patient agrees with this plan & written consent will be obtained.    Thank you for involving me in the care of this patient.      LOS: 0 days   Sherri Sear, MD  11/04/2018, 11:56 PM   Note: This dictation was prepared with Dragon dictation along with smaller phrase technology. Any transcriptional errors that result from this process are  unintentional.

## 2018-11-04 NOTE — ED Notes (Signed)
Pt clothes and belongings taken off and placed in bag at bedside.

## 2018-11-05 ENCOUNTER — Encounter: Payer: Self-pay | Admitting: Gastroenterology

## 2018-11-05 DIAGNOSIS — T18128A Food in esophagus causing other injury, initial encounter: Secondary | ICD-10-CM

## 2018-11-05 DIAGNOSIS — K222 Esophageal obstruction: Secondary | ICD-10-CM

## 2018-11-05 MED ORDER — OMEPRAZOLE 40 MG PO CPDR: 40.0000 mg | DELAYED_RELEASE_CAPSULE | Freq: Two times a day (BID) | ORAL | 3 refills | Status: DC

## 2018-11-05 MED ORDER — PROMETHAZINE HCL 25 MG/ML IJ SOLN: 6.2500 mg | INTRAMUSCULAR | Status: DC | PRN

## 2018-11-05 MED ORDER — FENTANYL CITRATE (PF) 100 MCG/2ML IJ SOLN: 25.0000 ug | INTRAMUSCULAR | Status: DC | PRN

## 2018-11-05 NOTE — Anesthesia Procedure Notes (Signed)
Procedure Name: Intubation Date/Time: 11/05/2018 12:56 AM Performed by: Lendon Colonel, CRNA Pre-anesthesia Checklist: Patient identified, Patient being monitored, Timeout performed, Emergency Drugs available and Suction available Patient Re-evaluated:Patient Re-evaluated prior to induction Oxygen Delivery Method: Circle system utilized Preoxygenation: Pre-oxygenation with 100% oxygen Induction Type: IV induction, Rapid sequence and Cricoid Pressure applied Laryngoscope Size: Miller and 2 Grade View: Grade I Tube type: Oral Tube size: 7.5 mm Number of attempts: 1 Airway Equipment and Method: Stylet Placement Confirmation: ETT inserted through vocal cords under direct vision,  positive ETCO2 and breath sounds checked- equal and bilateral Secured at: 21 cm Tube secured with: Tape Dental Injury: Teeth and Oropharynx as per pre-operative assessment

## 2018-11-05 NOTE — Discharge Instructions (Signed)

## 2018-11-05 NOTE — Anesthesia Post-op Follow-up Note (Signed)
Anesthesia QCDR form completed.        

## 2018-11-05 NOTE — Transfer of Care (Signed)
Immediate Anesthesia Transfer of Care Note  Patient: Johnathan Harvey  Procedure(s) Performed: ESOPHAGOGASTRODUODENOSCOPY (EGD) (N/A Mouth)  Patient Location: PACU  Anesthesia Type:General  Level of Consciousness: awake, alert , oriented and patient cooperative  Airway & Oxygen Therapy: Patient Spontanous Breathing and Patient connected to face mask oxygen  Post-op Assessment: Report given to RN and Post -op Vital signs reviewed and stable  Post vital signs: Reviewed and stable  Last Vitals:  Vitals Value Taken Time  BP 128/90 11/05/2018 12:19 AM  Temp 36.3 C 11/05/2018 12:19 AM  Pulse 60 11/05/2018 12:23 AM  Resp 14 11/05/2018 12:23 AM  SpO2 100 % 11/05/2018 12:23 AM  Vitals shown include unvalidated device data.  Last Pain:  Vitals:   11/05/18 0019  TempSrc:   PainSc: Asleep         Complications: No apparent anesthesia complications

## 2018-11-05 NOTE — Op Note (Signed)
[facility] Gastroenterology Patient Name: Johnathan Harvey Procedure Date: 11/04/2018 11:42 PM MRN: [id number] Account #: [id number] Date of Birth: 04/14/1956 Admit Type: Outpatient Age: 63 Room: [facility] ENDO ROOM 4 Gender: Male Note Status: Finalized Procedure:            Upper GI endoscopy Indications:          Esophageal dysphagia, Foreign body in the esophagus Providers:            Lin Landsman MD, MD Referring MD:         No Local Md, MD (Referring MD) Medicines:            Monitored Anesthesia Care Complications:        No immediate complications. Estimated blood loss: None. Procedure:            Pre-Anesthesia Assessment:                       - Prior to the procedure, a History and Physical was                        performed, and patient medications and allergies were                        reviewed. The patient is competent. The risks and                        benefits of the procedure and the sedation options and                        risks were discussed with the patient. All questions                        were answered and informed consent was obtained.                        Patient identification and proposed procedure were                        verified by the physician, the nurse, the                        anesthesiologist, the anesthetist and the technician in                        the pre-procedure area in the procedure room in the                        endoscopy suite. Mental Status Examination: alert and                        oriented. Airway Examination: normal oropharyngeal                        airway and neck mobility. Respiratory Examination:                        clear to auscultation. CV Examination: normal.                        Prophylactic Antibiotics: The patient does not require  prophylactic antibiotics. Prior Anticoagulants: The                        patient has taken no previous  anticoagulant or                        antiplatelet agents. ASA Grade Assessment: II - A                        patient with mild systemic disease. After reviewing the                        risks and benefits, the patient was deemed in                        satisfactory condition to undergo the procedure. The                        anesthesia plan was to use general anesthesia.                        Immediately prior to administration of medications, the                        patient was re-assessed for adequacy to receive                        sedatives. The heart rate, respiratory rate, oxygen                        saturations, blood pressure, adequacy of pulmonary                        ventilation, and response to care were monitored                        throughout the procedure. The physical status of the                        patient was re-assessed after the procedure.                       After obtaining informed consent, the endoscope was                        passed under direct vision. Throughout the procedure,                        the patient's blood pressure, pulse, and oxygen                        saturations were monitored continuously. The Endoscope                        was introduced through the mouth, and advanced to the                        second part of duodenum. The upper GI endoscopy was  accomplished without difficulty. The patient tolerated                        the procedure well. Findings:      One benign-appearing, intrinsic mild stenosis was found in the distal       esophagus. The stenosis was traversed with adult endoscope with minimal       resistance.      There was no impacted food in esophagus. Distal esophagus appeared       irritated from recent food impaction      Random biopsies from esophagus performed      A few dispersed, diminutive non-bleeding erosions were found in the       gastric antrum. There were  stigmata of recent bleeding.      The cardia and gastric fundus were normal on retroflexion.      A small amount of food (residue) was found in the gastric fundus.      The duodenal bulb and second portion of the duodenum were normal. Impression:           - Benign-appearing esophageal stenosis.                       - Non-bleeding erosive gastropathy.                       - A small amount of food (residue) in the stomach.                       - Normal duodenal bulb and second portion of the                        duodenum.                       - No specimens collected. Recommendation:       - Discharge patient to home (with escort).                       - Chopped diet and mechanical soft diet.                       - Use Prilosec (omeprazole) 40 mg PO BID.                       - Await pathology results.                       - Return to GI office in 4 weeks. Procedure Code(s):    --- Professional ---                       [phone], Esophagogastroduodenoscopy, flexible, transoral;                        diagnostic, including collection of specimen(s) by                        brushing or washing, when performed (separate procedure) Diagnosis Code(s):    --- Professional ---                       K22.2, Esophageal obstruction  K31.89, Other diseases of stomach and duodenum                       R13.14, Dysphagia, pharyngoesophageal phase                       T18.108A, Unspecified foreign body in esophagus causing                        other injury, initial encounter CPT copyright 2018 American Medical Association. All rights reserved. The codes documented in this report are preliminary and upon coder review may  be revised to meet current compliance requirements. Dr. Ulyess Mort Lin Landsman MD, MD 11/05/2018 12:10:37 AM This report has been signed electronically. Number of Addenda: 0 Note Initiated On: 11/04/2018 11:42 PM      [facility]

## 2018-11-06 LAB — SURGICAL PATHOLOGY

## 2018-11-06 NOTE — Anesthesia Postprocedure Evaluation (Signed)
Anesthesia Post Note  Patient: Johnathan Harvey  Procedure(s) Performed: ESOPHAGOGASTRODUODENOSCOPY (EGD) (N/A Mouth)  Patient location during evaluation: PACU Anesthesia Type: General Level of consciousness: awake and alert Pain management: pain level controlled Vital Signs Assessment: post-procedure vital signs reviewed and stable Respiratory status: spontaneous breathing, nonlabored ventilation, respiratory function stable and patient connected to nasal cannula oxygen Cardiovascular status: blood pressure returned to baseline and stable Postop Assessment: no apparent nausea or vomiting Anesthetic complications: no     Last Vitals:  Vitals:   11/05/18 0049 11/05/18 0110  BP: 125/81 114/79  Pulse: (!) 58 63  Resp: 14 18  Temp: 36.8 C   SpO2: 100% 100%    Last Pain:  Vitals:   11/05/18 0110  TempSrc:   PainSc: 0-No pain                 Martha Clan

## 2019-06-16 ENCOUNTER — Other Ambulatory Visit: Payer: Self-pay

## 2019-06-16 DIAGNOSIS — Z20822 Contact with and (suspected) exposure to covid-19: Secondary | ICD-10-CM

## 2019-06-18 LAB — NOVEL CORONAVIRUS, NAA: SARS-CoV-2, NAA: NOT DETECTED

## 2019-07-24 ENCOUNTER — Other Ambulatory Visit (HOSPITAL_COMMUNITY): Payer: Self-pay

## 2019-07-28 SURGERY — Surgical Case
Anesthesia: *Unknown

## 2019-08-22 ENCOUNTER — Other Ambulatory Visit: Payer: Self-pay

## 2019-08-22 DIAGNOSIS — Z20822 Contact with and (suspected) exposure to covid-19: Secondary | ICD-10-CM

## 2019-08-23 LAB — NOVEL CORONAVIRUS, NAA: SARS-CoV-2, NAA: NOT DETECTED

## 2019-09-19 ENCOUNTER — Encounter (HOSPITAL_BASED_OUTPATIENT_CLINIC_OR_DEPARTMENT_OTHER): Payer: Self-pay

## 2019-09-19 ENCOUNTER — Other Ambulatory Visit: Payer: Self-pay

## 2019-09-21 DIAGNOSIS — M542 Cervicalgia: Secondary | ICD-10-CM | POA: Insufficient documentation

## 2019-09-21 DIAGNOSIS — R519 Headache, unspecified: Secondary | ICD-10-CM | POA: Insufficient documentation

## 2019-09-23 ENCOUNTER — Other Ambulatory Visit (HOSPITAL_COMMUNITY)
Admission: RE | Admit: 2019-09-23 | Discharge: 2019-09-23 | Disposition: A | Discharge status: Home / Self Care | Payer: Medicare Other | Source: Ambulatory Visit | Attending: Surgery | Admitting: Surgery

## 2019-09-23 DIAGNOSIS — Z01812 Encounter for preprocedural laboratory examination: Secondary | ICD-10-CM | POA: Insufficient documentation

## 2019-09-23 DIAGNOSIS — Z20828 Contact with and (suspected) exposure to other viral communicable diseases: Secondary | ICD-10-CM | POA: Diagnosis not present

## 2019-09-24 LAB — NOVEL CORONAVIRUS, NAA (HOSP ORDER, SEND-OUT TO REF LAB; TAT 18-24 HRS): SARS-CoV-2, NAA: NOT DETECTED

## 2019-09-25 NOTE — H&P (Signed)
Johnathan Harvey Documented: 07/17/2019 9:36 AM Location: [place] Office Patient #: [id number] DOB: 29-Jul-1956 Single / Language: Johnathan Harvey / Race: White Male   History of Present Illness Johnathan Key B. Hassell Done MD; 07/17/2019 10:02 AM) The patient is a 63 year old male who presents with an inguinal hernia. The hernia(s) is/are located on the left side. He has had it about 4 years. 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 It is visible when he stands and it sticks out more at times. No history of incarceration.   I discussed lap and open left inguinal hernia repairs. There is no right inguinal hernia. Will proceed with right inguinal hernia repair with mesh under general as an outpatient. I discussed the complications and risks.    Past Surgical History Malachi Bonds, CMA; 07/17/2019 9:42 AM) Foot Surgery  Left. Resection of Stomach   Allergies Malachi Bonds, CMA; 07/17/2019 9:44 AM) No Known Drug Allergies [07/17/2019]:  Medication History (Malachi Bonds, CMA; 07/17/2019 9:44 AM) tiZANidine HCl (2MG  Tablet, Oral) Active. Medications Reconciled  Other Problems Malachi Bonds, CMA; 07/17/2019 9:42 AM) Alcohol Abuse  Arthritis  Gastroesophageal Reflux Disease  Melanoma     Review of Systems (Chemira Jones CMA; 07/17/2019 9:42 AM) General Not Present- Appetite Loss, Chills, Fatigue, Fever, Night Sweats, Weight Gain and Weight Loss. Skin Not Present- Change in Wart/Mole, Dryness, Hives, Jaundice, New Lesions, Non-Healing Wounds, Rash and Ulcer. HEENT Not Present- Earache, Hearing Loss, Hoarseness, Nose Bleed, Oral Ulcers, Ringing in the Ears, Seasonal Allergies, Sinus Pain, Sore Throat, Visual Disturbances, Wears glasses/contact lenses and Yellow Eyes. Respiratory Not Present- Bloody sputum, Chronic Cough, Difficulty Breathing, Snoring and Wheezing. Cardiovascular Not Present- Chest Pain, Difficulty Breathing Lying Down, Leg  Cramps, Palpitations, Rapid Heart Rate, Shortness of Breath and Swelling of Extremities. Gastrointestinal Not Present- Abdominal Pain, Bloating, Bloody Stool, Change in Bowel Habits, Chronic diarrhea, Constipation, Difficulty Swallowing, Excessive gas, Gets full quickly at meals, Hemorrhoids, Indigestion, Nausea, Rectal Pain and Vomiting. Male Genitourinary Not Present- Blood in Urine, Change in Urinary Stream, Frequency, Impotence, Nocturia, Painful Urination, Urgency and Urine Leakage. Musculoskeletal Not Present- Back Pain, Joint Pain, Joint Stiffness, Muscle Pain, Muscle Weakness and Swelling of Extremities. Neurological Not Present- Decreased Memory, Fainting, Headaches, Numbness, Seizures, Tingling, Tremor, Trouble walking and Weakness. Psychiatric Not Present- Anxiety, Bipolar, Change in Sleep Pattern, Depression, Fearful and Frequent crying. Endocrine Not Present- Cold Intolerance, Excessive Hunger, Hair Changes, Heat Intolerance, Hot flashes and New Diabetes. Hematology Not Present- Blood Thinners, Easy Bruising, Excessive bleeding, Gland problems, HIV and Persistent Infections.  Vitals (Chemira Jones CMA; 07/17/2019 9:43 AM) 07/17/2019 9:43 AM Weight: 129 lb Height: 70in Body Surface Area: 1.73 m Body Mass Index: 18.51 kg/m  Pulse: 62 (Regular)  BP: 130/70 (Sitting, Left Arm, Standard)       Physical Exam (Zulay Corrie B. Hassell Done MD; 07/17/2019 10:03 AM) General Note: Normal weight WM NAD HEENT unremakable Neck no bruits Chest clear Heart SR Abd/GU left inguinal hernia that is reducible and visible when standing     Assessment & Plan Johnathan Key B. Hassell Done MD; 07/17/2019 10:04 AM) LEFT INGUINAL HERNIA (K40.90) Impression: Will schedule left inguinal hernia repair at [facility] or [facility]. Booklet given.  Signed by Pedro Earls, MD (07/17/2019 10:06 AM)

## 2019-09-25 NOTE — Anesthesia Preprocedure Evaluation (Addendum)
Anesthesia Evaluation  Patient identified by MRN, date of birth, ID band Patient awake    Reviewed: Allergy & Precautions, NPO status , Patient's Chart, lab work & pertinent test results  Airway Mallampati: II  TM Distance: >3 FB Neck ROM: Full    Dental no notable dental hx. (+) Teeth Intact   Pulmonary neg pulmonary ROS, former smoker,    Pulmonary exam normal breath sounds clear to auscultation       Cardiovascular hypertension, Pt. on medications Normal cardiovascular exam Rhythm:Regular Rate:Normal     Neuro/Psych negative neurological ROS  negative psych ROS   GI/Hepatic GERD  ,(+)     substance abuse  alcohol use, Hx of . None recently   Endo/Other  negative endocrine ROS  Renal/GU negative Renal ROS     Musculoskeletal  (+) Arthritis ,   Abdominal   Peds  Hematology negative hematology ROS (+)   Anesthesia Other Findings   Reproductive/Obstetrics                           Anesthesia Physical Anesthesia Plan  ASA: III  Anesthesia Plan: General   Post-op Pain Management:  Regional for Post-op pain   Induction: Intravenous  PONV Risk Score and Plan: Treatment may vary due to age or medical condition, Ondansetron and Midazolam  Airway Management Planned: LMA  Additional Equipment: None  Intra-op Plan:   Post-operative Plan:   Informed Consent: I have reviewed the patients History and Physical, chart, labs and discussed the procedure including the risks, benefits and alternatives for the proposed anesthesia with the patient or authorized representative who has indicated his/her understanding and acceptance.     Dental advisory given  Plan Discussed with:   Anesthesia Plan Comments: (GA w L TAP block)       Anesthesia Quick Evaluation

## 2019-09-26 ENCOUNTER — Encounter (HOSPITAL_BASED_OUTPATIENT_CLINIC_OR_DEPARTMENT_OTHER)
Admission: RE | Disposition: A | Discharge status: Home / Self Care | Payer: Self-pay | Source: Home / Self Care | Attending: Surgery

## 2019-09-26 ENCOUNTER — Ambulatory Visit (HOSPITAL_BASED_OUTPATIENT_CLINIC_OR_DEPARTMENT_OTHER): Payer: Medicare Other | Admitting: Anesthesiology

## 2019-09-26 ENCOUNTER — Ambulatory Visit (HOSPITAL_BASED_OUTPATIENT_CLINIC_OR_DEPARTMENT_OTHER)
Admission: RE | Admit: 2019-09-26 | Discharge: 2019-09-26 | Disposition: A | Discharge status: Home / Self Care | Payer: Medicare Other | Attending: Surgery | Admitting: Surgery

## 2019-09-26 ENCOUNTER — Other Ambulatory Visit: Payer: Self-pay

## 2019-09-26 ENCOUNTER — Encounter (HOSPITAL_BASED_OUTPATIENT_CLINIC_OR_DEPARTMENT_OTHER): Payer: Self-pay | Admitting: Surgery

## 2019-09-26 DIAGNOSIS — Z85820 Personal history of malignant melanoma of skin: Secondary | ICD-10-CM | POA: Insufficient documentation

## 2019-09-26 DIAGNOSIS — K4090 Unilateral inguinal hernia, without obstruction or gangrene, not specified as recurrent: Secondary | ICD-10-CM | POA: Insufficient documentation

## 2019-09-26 DIAGNOSIS — M1990 Unspecified osteoarthritis, unspecified site: Secondary | ICD-10-CM | POA: Insufficient documentation

## 2019-09-26 DIAGNOSIS — K219 Gastro-esophageal reflux disease without esophagitis: Secondary | ICD-10-CM | POA: Diagnosis not present

## 2019-09-26 HISTORY — PX: INGUINAL HERNIA REPAIR: SHX194

## 2019-09-26 SURGERY — REPAIR, HERNIA, INGUINAL, ADULT
Anesthesia: General | Site: Groin | Laterality: Left

## 2019-09-26 MED ORDER — PROPOFOL 10 MG/ML IV BOLUS
INTRAVENOUS | Status: DC | PRN
  Administered 2019-09-26: 80 mg via INTRAVENOUS

## 2019-09-26 MED ORDER — HYDROCODONE-ACETAMINOPHEN 5-325 MG PO TABS: 1.0000 | ORAL_TABLET | Freq: Four times a day (QID) | ORAL | 0 refills | Status: DC | PRN

## 2019-09-26 MED ORDER — FENTANYL CITRATE (PF) 100 MCG/2ML IJ SOLN: 25.0000 ug | INTRAMUSCULAR | Status: DC | PRN

## 2019-09-26 MED ORDER — ROPIVACAINE HCL 5 MG/ML IJ SOLN
INTRAMUSCULAR | Status: DC | PRN
  Administered 2019-09-26: 125 mg

## 2019-09-26 MED ORDER — DEXAMETHASONE SODIUM PHOSPHATE 10 MG/ML IJ SOLN
INTRAMUSCULAR | Status: DC | PRN
  Administered 2019-09-26: 5 mg via INTRAVENOUS

## 2019-09-26 MED ORDER — ACETAMINOPHEN 500 MG PO TABS
ORAL_TABLET | ORAL | Status: AC
  Filled 2019-09-26: qty 2

## 2019-09-26 MED ORDER — FENTANYL CITRATE (PF) 100 MCG/2ML IJ SOLN
INTRAMUSCULAR | Status: AC
  Filled 2019-09-26: qty 2

## 2019-09-26 MED ORDER — HEPARIN SODIUM (PORCINE) 5000 UNIT/ML IJ SOLN
INTRAMUSCULAR | Status: AC
  Filled 2019-09-26: qty 1

## 2019-09-26 MED ORDER — GLYCOPYRROLATE 0.2 MG/ML IJ SOLN
INTRAMUSCULAR | Status: DC | PRN
  Administered 2019-09-26: .2 mg via INTRAVENOUS

## 2019-09-26 MED ORDER — LACTATED RINGERS IV SOLN
INTRAVENOUS | Status: DC
  Administered 2019-09-26 (×2): via INTRAVENOUS

## 2019-09-26 MED ORDER — MIDAZOLAM HCL 2 MG/2ML IJ SOLN
INTRAMUSCULAR | Status: AC
  Filled 2019-09-26: qty 2

## 2019-09-26 MED ORDER — BUPIVACAINE LIPOSOME 1.3 % IJ SUSP: 20.0000 mL | Freq: Once | INTRAMUSCULAR | Status: DC

## 2019-09-26 MED ORDER — ACETAMINOPHEN 10 MG/ML IV SOLN: 1000.0000 mg | Freq: Once | INTRAVENOUS | Status: DC | PRN

## 2019-09-26 MED ORDER — FENTANYL CITRATE (PF) 100 MCG/2ML IJ SOLN
50.0000 ug | INTRAMUSCULAR | Status: AC | PRN
  Administered 2019-09-26 (×3): 50 ug via INTRAVENOUS

## 2019-09-26 MED ORDER — ONDANSETRON HCL 4 MG/2ML IJ SOLN
INTRAMUSCULAR | Status: DC | PRN
  Administered 2019-09-26: 4 mg via INTRAVENOUS

## 2019-09-26 MED ORDER — PROPOFOL 10 MG/ML IV BOLUS
INTRAVENOUS | Status: AC
  Filled 2019-09-26: qty 20

## 2019-09-26 MED ORDER — CHLORHEXIDINE GLUCONATE CLOTH 2 % EX PADS: 6.0000 | MEDICATED_PAD | Freq: Once | CUTANEOUS | Status: DC

## 2019-09-26 MED ORDER — HEPARIN SODIUM (PORCINE) 5000 UNIT/ML IJ SOLN: 5000.0000 [IU] | Freq: Once | INTRAMUSCULAR | Status: DC

## 2019-09-26 MED ORDER — CEFAZOLIN SODIUM-DEXTROSE 2-4 GM/100ML-% IV SOLN
2.0000 g | INTRAVENOUS | Status: AC
  Administered 2019-09-26: 2 g via INTRAVENOUS

## 2019-09-26 MED ORDER — ONDANSETRON HCL 4 MG/2ML IJ SOLN: 4.0000 mg | Freq: Once | INTRAMUSCULAR | Status: DC | PRN

## 2019-09-26 MED ORDER — LIDOCAINE HCL (CARDIAC) PF 100 MG/5ML IV SOSY
PREFILLED_SYRINGE | INTRAVENOUS | Status: DC | PRN
  Administered 2019-09-26: 60 mg via INTRAVENOUS

## 2019-09-26 MED ORDER — CEFAZOLIN SODIUM-DEXTROSE 2-4 GM/100ML-% IV SOLN
INTRAVENOUS | Status: AC
  Filled 2019-09-26: qty 100

## 2019-09-26 MED ORDER — MIDAZOLAM HCL 2 MG/2ML IJ SOLN: 1.0000 mg | INTRAMUSCULAR | Status: DC | PRN

## 2019-09-26 MED ORDER — ACETAMINOPHEN 500 MG PO TABS
1000.0000 mg | ORAL_TABLET | ORAL | Status: AC
  Administered 2019-09-26: 1000 mg via ORAL

## 2019-09-26 SURGICAL SUPPLY — 39 items
BLADE CLIPPER SURG (BLADE) ×3 IMPLANT
BLADE SURG 15 STRL LF DISP TIS (BLADE) ×2 IMPLANT
BLADE SURG 15 STRL SS (BLADE) ×4
CANISTER SUCT 1200ML W/VALVE (MISCELLANEOUS) ×3 IMPLANT
COVER BACK TABLE REUSABLE LG (DRAPES) ×3 IMPLANT
COVER MAYO STAND REUSABLE (DRAPES) ×3 IMPLANT
COVER WAND RF STERILE (DRAPES) ×3 IMPLANT
DECANTER SPIKE VIAL GLASS SM (MISCELLANEOUS) IMPLANT
DERMABOND ADVANCED (GAUZE/BANDAGES/DRESSINGS) ×2
DERMABOND ADVANCED .7 DNX12 (GAUZE/BANDAGES/DRESSINGS) ×1 IMPLANT
DRAIN PENROSE 1/2X12 LTX STRL (WOUND CARE) ×3 IMPLANT
DRAPE LAPAROTOMY T 102X78X121 (DRAPES) ×3 IMPLANT
DRAPE UTILITY XL STRL (DRAPES) ×3 IMPLANT
ELECT COATED BLADE 2.86 ST (ELECTRODE) ×3 IMPLANT
ELECT REM PT RETURN 9FT ADLT (ELECTROSURGICAL) ×3
ELECTRODE REM PT RTRN 9FT ADLT (ELECTROSURGICAL) ×1 IMPLANT
GLOVE BIO SURGEON STRL SZ8 (GLOVE) ×3 IMPLANT
GOWN STRL REUS W/ TWL LRG LVL3 (GOWN DISPOSABLE) ×1 IMPLANT
GOWN STRL REUS W/ TWL XL LVL3 (GOWN DISPOSABLE) ×1 IMPLANT
GOWN STRL REUS W/TWL LRG LVL3 (GOWN DISPOSABLE) ×2
GOWN STRL REUS W/TWL XL LVL3 (GOWN DISPOSABLE) ×2
MESH HERNIA 3X6 (Mesh General) ×3 IMPLANT
NEEDLE HYPO 25X1 1.5 SAFETY (NEEDLE) ×3 IMPLANT
NS IRRIG 1000ML POUR BTL (IV SOLUTION) ×3 IMPLANT
PACK BASIN DAY SURGERY FS (CUSTOM PROCEDURE TRAY) ×3 IMPLANT
PENCIL SMOKE EVACUATOR (MISCELLANEOUS) ×3 IMPLANT
SUT MON AB 5-0 PS2 18 (SUTURE) ×3 IMPLANT
SUT PROLENE 2 0 CT2 30 (SUTURE) ×6 IMPLANT
SUT SILK 2 0 SH (SUTURE) IMPLANT
SUT VIC AB 2-0 SH 27 (SUTURE) ×2
SUT VIC AB 2-0 SH 27XBRD (SUTURE) ×1 IMPLANT
SUT VIC AB 4-0 SH 18 (SUTURE) ×3 IMPLANT
SYR BULB 3OZ (MISCELLANEOUS) ×3 IMPLANT
SYR CONTROL 10ML LL (SYRINGE) ×3 IMPLANT
TOWEL GREEN STERILE FF (TOWEL DISPOSABLE) ×3 IMPLANT
TRAY DSU PREP LF (CUSTOM PROCEDURE TRAY) ×3 IMPLANT
TUBE CONNECTING 20'X1/4 (TUBING) ×1
TUBE CONNECTING 20X1/4 (TUBING) ×2 IMPLANT
YANKAUER SUCT BULB TIP NO VENT (SUCTIONS) ×3 IMPLANT

## 2019-09-26 NOTE — Discharge Instructions (Signed)
Inguinal Hernia, Adult An inguinal hernia is when fat or your intestines push through a weak spot in a muscle where your leg meets your lower belly (groin). This causes a rounded lump (bulge). This kind of hernia could also be:  In your scrotum, if you are male.  In folds of skin around your vagina, if you are male. There are three types of inguinal hernias. These include:  Hernias that can be pushed back into the belly (are reducible). This type rarely causes pain.  Hernias that cannot be pushed back into the belly (are incarcerated).  Hernias that cannot be pushed back into the belly and lose their blood supply (are strangulated). This type needs emergency surgery. If you do not have symptoms, you may not need treatment. If you have symptoms or a large hernia, you may need surgery. Follow these instructions at home: Lifestyle  Do these things if told by your doctor so you do not have trouble pooping (constipation): ? Drink enough fluid to keep your pee (urine) pale yellow. ? Eat foods that have a lot of fiber. These include fresh fruits and vegetables, whole grains, and beans. ? Limit foods that are high in fat and processed sugars. These include foods that are fried or sweet. ? Take medicine for trouble pooping.  Avoid lifting heavy objects.  Avoid standing for long amounts of time.  Do not use any products that contain nicotine or tobacco. These include cigarettes and e-cigarettes. If you need help quitting, ask your doctor.  Stay at a healthy weight. General instructions  You may try to push your hernia in by very gently pressing on it when you are lying down. Do not try to force the bulge back in if it will not push in easily.  Watch your hernia for any changes in shape, size, or color. Tell your doctor if you see any changes.  Take over-the-counter and prescription medicines only as told by your doctor.  Keep all follow-up visits as told by your doctor. This is  important. Contact a doctor if:  You have a fever.  You have new symptoms.  Your symptoms get worse. Get help right away if:  The area where your leg meets your lower belly has: ? Pain that gets worse suddenly. ? A bulge that gets bigger suddenly, and it does not get smaller after that. ? A bulge that turns red or purple. ? A bulge that is painful when you touch it.  You are a man, and your scrotum: ? Suddenly feels painful. ? Suddenly changes in size.  You cannot push the hernia in by very gently pressing on it when you are lying down. Do not try to force the bulge back in if it will not push in easily.  You feel sick to your stomach (nauseous), and that feeling does not go away.  You throw up (vomit), and that keeps happening.  You have a fast heartbeat.  You cannot poop (have a bowel movement) or pass gas. These symptoms may be an emergency. Do not wait to see if the symptoms will go away. Get medical help right away. Call your local emergency services (911 in the [place] Summary  An inguinal hernia is when fat or your intestines push through a weak spot in a muscle where your leg meets your lower belly (groin). This causes a rounded lump (bulge).  If you do not have symptoms, you may not need treatment. If you have symptoms or a large hernia, you   may need surgery.  Avoid lifting heavy objects. Also avoid standing for long amounts of time.  Do not try to force the bulge back in if it will not push in easily. This information is not intended to replace advice given to you by your health care provider. Make sure you discuss any questions you have with your health care provider. Document Released: 11/02/2006 Document Revised: 11/03/2017 Document Reviewed: 07/04/2017 Elsevier Patient Education  [place].    No Tylenol before 4:00pm  Post Anesthesia Home Care Instructions  Activity: Get plenty of rest for the remainder of the day. A responsible individual must  stay with you for 24 hours following the procedure.  For the next 24 hours, DO NOT: -Drive a car -Paediatric nurse -Drink alcoholic beverages -Take any medication unless instructed by your physician -Make any legal decisions or sign important papers.  Meals: Start with liquid foods such as gelatin or soup. Progress to regular foods as tolerated. Avoid greasy, spicy, heavy foods. If nausea and/or vomiting occur, drink only clear liquids until the nausea and/or vomiting subsides. Call your physician if vomiting continues.  Special Instructions/Symptoms: Your throat may feel dry or sore from the anesthesia or the breathing tube placed in your throat during surgery. If this causes discomfort, gargle with warm salt water. The discomfort should disappear within 24 hours.  If you had a scopolamine patch placed behind your ear for the management of post- operative nausea and/or vomiting:  1. The medication in the patch is effective for 72 hours, after which it should be removed.  Wrap patch in a tissue and discard in the trash. Wash hands thoroughly with soap and water. 2. You may remove the patch earlier than 72 hours if you experience unpleasant side effects which may include dry mouth, dizziness or visual disturbances. 3. Avoid touching the patch. Wash your hands with soap and water after contact with the patch.

## 2019-09-26 NOTE — Anesthesia Procedure Notes (Signed)
Procedure Name: LMA Insertion Date/Time: 09/26/2019 10:52 AM Performed by: Jonna Munro, CRNA Pre-anesthesia Checklist: Patient identified, Emergency Drugs available, Suction available, Patient being monitored and Timeout performed Patient Re-evaluated:Patient Re-evaluated prior to induction Oxygen Delivery Method: Circle system utilized Preoxygenation: Pre-oxygenation with 100% oxygen Induction Type: IV induction LMA: LMA flexible inserted LMA Size: 5.0 Number of attempts: 1 Placement Confirmation: positive ETCO2 and breath sounds checked- equal and bilateral Dental Injury: Teeth and Oropharynx as per pre-operative assessment

## 2019-09-26 NOTE — Progress Notes (Signed)
Assisted Dr. Houser with left, ultrasound guided, transabdominal plane block. Side rails up, monitors on throughout procedure. See vital signs in flow sheet. Tolerated Procedure well. °

## 2019-09-26 NOTE — Op Note (Signed)
Johnathan Harvey  1956-07-06 26 September 2019    PCP:  [facility]   Surgeon: Kaylyn Lim, MD, FACS  Asst:  Reather Laurence, MD  Anes:  general  Preop Dx: Left inguinal hernia  Postop Dx: Left indirect inguinal hernia  Procedure: Open left inguinal herniorrhaphy with mesh Location Surgery: [facility] day surgery room 8 Complications: None  EBL:   Minimal cc  Drains: None  Description of Procedure:  The patient was taken to OR 8.  After anesthesia was administered and the patient was prepped  with Chloroprep and a timeout was performed.  An oblique left inguinal incision was made and carried down to the exit internal oblique musculature.  This was incised along the fibers to open to the external ring.  The cord was mobilized and a Penrose placed about it.  The cord was inspected and an indirect hernia was found that extended down into the to the external ring.  This was separated from the cord structures.  There was a strictured area approximately and we separated from the cord structures and ligated with a 2-0 silk after twisting it.  The internal ring was a little loose and I was approximated with a single 2-0 silk.  A piece of Marlex type mesh was cut to fit and sutured the anal ligament with a running 2-0 Prolene and medially with a running 2-0 Prolene and then sutured to itself with a horizontal mattress suture laterally.  The patient had a previous block by anesthesia.  The area was irrigated with saline.  The external oblique was closed with a running 2-0 Vicryl.  The wound was then closed with 4-0 Vicryls and then a running subcuticular 4-0 Monocryl with Dermabond.  The patient tolerated the procedure well was taken to the recovery room in satisfactory condition.  The patient tolerated the procedure well and was taken to the PACU in stable condition.     [name] Done, [place], [facility], [place]

## 2019-09-26 NOTE — Anesthesia Procedure Notes (Addendum)
Anesthesia Regional Block: TAP block   Pre-Anesthetic Checklist: ,, timeout performed, Correct Patient, Correct Site, Correct Laterality, Correct Procedure, Correct Position, site marked, Risks and benefits discussed, Surgical consent,  At surgeon's request and post-op pain management  Laterality: Left     Needles:      Needle Length: 5cm  Needle Gauge: 22     Additional Needles:   Procedures: Doppler guided,,,,,,,,  Narrative:  Start time: 09/26/2019 10:08 AM End time: 09/26/2019 10:15 AM  Performed by: Personally  Anesthesiologist: Barnet Glasgow, MD

## 2019-09-26 NOTE — Transfer of Care (Signed)
Immediate Anesthesia Transfer of Care Note  Patient: Johnathan Harvey  Procedure(s) Performed: LEFT INGUINAL HERNIA REPAIR (Left Groin)  Patient Location: PACU  Anesthesia Type:General  Level of Consciousness: awake, alert  and oriented  Airway & Oxygen Therapy: Patient Spontanous Breathing  Post-op Assessment: Report given to RN and Post -op Vital signs reviewed and stable  Post vital signs: Reviewed and stable  Last Vitals:  Vitals Value Taken Time  BP    Temp    Pulse 61 09/26/19 1208  Resp 14 09/26/19 1208  SpO2 100 % 09/26/19 1208  Vitals shown include unvalidated device data.  Last Pain:  Vitals:   09/26/19 1005  TempSrc:   PainSc: 0-No pain      Patients Stated Pain Goal: 4 ([id number])  Complications: No apparent anesthesia complications

## 2019-09-26 NOTE — Interval H&P Note (Signed)
History and Physical Interval Note:  09/26/2019 10:29 AM  Johnathan Harvey  has presented today for surgery, with the diagnosis of LEFT INGUINAL HERNIA.  The various methods of treatment have been discussed with the patient and family. After consideration of risks, benefits and other options for treatment, the patient has consented to  Procedure(s): LEFT INGUINAL HERNIA REPAIR (Left) as a surgical intervention.  The patient's history has been reviewed, patient examined, no change in status, stable for surgery.  I have reviewed the patient's chart and labs.  Questions were answered to the patient's satisfaction.     Pedro Earls

## 2019-09-29 ENCOUNTER — Encounter: Payer: Self-pay | Admitting: *Deleted

## 2019-10-05 NOTE — Anesthesia Postprocedure Evaluation (Signed)
Anesthesia Post Note  Patient: Haoxuan Orso Abplanalp  Procedure(s) Performed: LEFT INGUINAL HERNIA REPAIR (Left Groin)     Patient location during evaluation: PACU Anesthesia Type: General Level of consciousness: awake and alert Pain management: pain level controlled Vital Signs Assessment: post-procedure vital signs reviewed and stable Respiratory status: spontaneous breathing, nonlabored ventilation, respiratory function stable and patient connected to nasal cannula oxygen Cardiovascular status: blood pressure returned to baseline and stable Postop Assessment: no apparent nausea or vomiting Anesthetic complications: no    Last Vitals:  Vitals:   09/26/19 1330 09/26/19 1348  BP: (!) 152/90 (!) 156/94  Pulse: 68 65  Resp: 16 20  Temp:  36.6 C  SpO2: 100% 100%    Last Pain:  Vitals:   09/29/19 1614  TempSrc:   PainSc: 2                  Barnet Glasgow

## 2019-10-10 ENCOUNTER — Emergency Department: Payer: Medicare Other

## 2019-10-10 ENCOUNTER — Emergency Department
Admission: EM | Admit: 2019-10-10 | Discharge: 2019-10-11 | Disposition: A | Discharge status: Home / Self Care | Payer: Medicare Other | Attending: Emergency Medicine | Admitting: Emergency Medicine

## 2019-10-10 DIAGNOSIS — Z85828 Personal history of other malignant neoplasm of skin: Secondary | ICD-10-CM | POA: Diagnosis not present

## 2019-10-10 DIAGNOSIS — F1990 Other psychoactive substance use, unspecified, uncomplicated: Secondary | ICD-10-CM | POA: Diagnosis not present

## 2019-10-10 DIAGNOSIS — R404 Transient alteration of awareness: Secondary | ICD-10-CM | POA: Diagnosis not present

## 2019-10-10 DIAGNOSIS — Z20828 Contact with and (suspected) exposure to other viral communicable diseases: Secondary | ICD-10-CM | POA: Diagnosis not present

## 2019-10-10 DIAGNOSIS — Z87891 Personal history of nicotine dependence: Secondary | ICD-10-CM | POA: Insufficient documentation

## 2019-10-10 DIAGNOSIS — R509 Fever, unspecified: Secondary | ICD-10-CM | POA: Diagnosis not present

## 2019-10-10 DIAGNOSIS — I10 Essential (primary) hypertension: Secondary | ICD-10-CM | POA: Insufficient documentation

## 2019-10-10 DIAGNOSIS — R4182 Altered mental status, unspecified: Secondary | ICD-10-CM | POA: Diagnosis present

## 2019-10-10 DIAGNOSIS — E162 Hypoglycemia, unspecified: Secondary | ICD-10-CM | POA: Insufficient documentation

## 2019-10-10 LAB — CK: Total CK: 292 U/L (ref 49–397)

## 2019-10-10 LAB — URINALYSIS, COMPLETE (UACMP) WITH MICROSCOPIC
Bacteria, UA: NONE SEEN
Bilirubin Urine: NEGATIVE
Glucose, UA: NEGATIVE mg/dL
Ketones, ur: NEGATIVE mg/dL
Leukocytes,Ua: NEGATIVE
Nitrite: NEGATIVE
Protein, ur: NEGATIVE mg/dL
RBC / HPF: >50 RBC/hpf — ABNORMAL HIGH (ref 0–5)
Specific Gravity, Urine: 1.01 (ref 1.005–1.030)
Squamous Epithelial / HPF: NONE SEEN (ref 0–5)
pH: 6 (ref 5.0–8.0)

## 2019-10-10 LAB — CBC WITH DIFFERENTIAL/PLATELET
Abs Immature Granulocytes: 0.09 10*3/uL — ABNORMAL HIGH (ref 0.00–0.07)
Basophils Absolute: 0 10*3/uL (ref 0.0–0.1)
Basophils Relative: 0 %
Eosinophils Absolute: 0.2 10*3/uL (ref 0.0–0.5)
Eosinophils Relative: 1 %
HCT: 39.8 % (ref 39.0–52.0)
Hemoglobin: 14.1 g/dL (ref 13.0–17.0)
Immature Granulocytes: 1 %
Lymphocytes Relative: 8 %
Lymphs Abs: 1.1 10*3/uL (ref 0.7–4.0)
MCH: 30 pg (ref 26.0–34.0)
MCHC: 35.4 g/dL (ref 30.0–36.0)
MCV: 84.7 fL (ref 80.0–100.0)
Monocytes Absolute: 0.9 10*3/uL (ref 0.1–1.0)
Monocytes Relative: 7 %
Neutro Abs: 10.7 10*3/uL — ABNORMAL HIGH (ref 1.7–7.7)
Neutrophils Relative %: 83 %
Platelets: 255 10*3/uL (ref 150–400)
RBC: 4.7 MIL/uL (ref 4.22–5.81)
RDW: 12.7 % (ref 11.5–15.5)
WBC: 13 10*3/uL — ABNORMAL HIGH (ref 4.0–10.5)
nRBC: 0 % (ref 0.0–0.2)

## 2019-10-10 LAB — COMPREHENSIVE METABOLIC PANEL
ALT: 15 U/L (ref 0–44)
AST: 23 U/L (ref 15–41)
Albumin: 3.9 g/dL (ref 3.5–5.0)
Alkaline Phosphatase: 69 U/L (ref 38–126)
Anion gap: 10 (ref 5–15)
BUN: 15 mg/dL (ref 8–23)
CO2: 23 mmol/L (ref 22–32)
Calcium: 8.8 mg/dL — ABNORMAL LOW (ref 8.9–10.3)
Chloride: 101 mmol/L (ref 98–111)
Creatinine, Ser: 0.55 mg/dL — ABNORMAL LOW (ref 0.61–1.24)
GFR calc Af Amer: >60 mL/min (ref >60)
GFR calc non Af Amer: >60 mL/min (ref >60)
Glucose, Bld: 103 mg/dL — ABNORMAL HIGH (ref 70–99)
Potassium: 4 mmol/L (ref 3.5–5.1)
Sodium: 134 mmol/L — ABNORMAL LOW (ref 135–145)
Total Bilirubin: 1.7 mg/dL — ABNORMAL HIGH (ref 0.3–1.2)
Total Protein: 6.7 g/dL (ref 6.5–8.1)

## 2019-10-10 LAB — URINE DRUG SCREEN, QUALITATIVE (ARMC ONLY)
Amphetamines, Ur Screen: NOT DETECTED
Barbiturates, Ur Screen: NOT DETECTED
Benzodiazepine, Ur Scrn: POSITIVE — AB
Cannabinoid 50 Ng, Ur ~~LOC~~: POSITIVE — AB
Cocaine Metabolite,Ur ~~LOC~~: NOT DETECTED
MDMA (Ecstasy)Ur Screen: NOT DETECTED
Methadone Scn, Ur: NOT DETECTED
Opiate, Ur Screen: NOT DETECTED
Phencyclidine (PCP) Ur S: NOT DETECTED
Tricyclic, Ur Screen: NOT DETECTED

## 2019-10-10 LAB — BLOOD GAS, VENOUS
Acid-Base Excess: 4.1 mmol/L — ABNORMAL HIGH (ref 0.0–2.0)
Bicarbonate: 28.5 mmol/L — ABNORMAL HIGH (ref 20.0–28.0)
O2 Saturation: 95.5 %
Patient temperature: 37
pCO2, Ven: 41 mmHg — ABNORMAL LOW (ref 44.0–60.0)
pH, Ven: 7.45 — ABNORMAL HIGH (ref 7.250–7.430)
pO2, Ven: 75 mmHg — ABNORMAL HIGH (ref 32.0–45.0)

## 2019-10-10 LAB — RESPIRATORY PANEL BY RT PCR (FLU A&B, COVID)
Influenza A by PCR: NEGATIVE
Influenza B by PCR: NEGATIVE
SARS Coronavirus 2 by RT PCR: NEGATIVE

## 2019-10-10 LAB — PROCALCITONIN: Procalcitonin: <0.1 ng/mL

## 2019-10-10 LAB — ETHANOL: Alcohol, Ethyl (B): <10 mg/dL (ref <10)

## 2019-10-10 LAB — ACETAMINOPHEN LEVEL: Acetaminophen (Tylenol), Serum: <10 ug/mL — ABNORMAL LOW (ref 10–30)

## 2019-10-10 LAB — LACTIC ACID, PLASMA: Lactic Acid, Venous: 0.8 mmol/L (ref 0.5–1.9)

## 2019-10-10 LAB — SALICYLATE LEVEL: Salicylate Lvl: <7 mg/dL — ABNORMAL LOW (ref 7.0–30.0)

## 2019-10-10 MED ORDER — NALOXONE HCL 0.4 MG/ML IJ SOLN
0.0500 mg | Freq: Once | INTRAMUSCULAR | Status: DC
  Filled 2019-10-10: qty 1

## 2019-10-10 MED ORDER — NALOXONE HCL 0.4 MG/ML IJ SOLN
0.2000 mg | Freq: Once | INTRAMUSCULAR | Status: AC
  Administered 2019-10-10: 0.2 mg via INTRAVENOUS

## 2019-10-10 NOTE — ED Notes (Signed)
daughter at bedside

## 2019-10-10 NOTE — ED Triage Notes (Signed)
Pt from home via [facility]. Per EMS, pt was found on floor unconscious on floor by daughter, who called [facility]. EMS st unresponsive upon arrival laying on floor. Daughter at bedside st LKW Tuesday; pt had recent hernia surgery Rx oxycodone, family unable to find medication. Pt arrives with fever 100.4. CBG 70.

## 2019-10-10 NOTE — ED Provider Notes (Addendum)
[facility] Emergency Department Provider Note  ____________________________________________   First MD Initiated Contact with Patient 10/10/19 1832     (approximate)  I have reviewed the triage vital signs and the nursing notes.  History  Chief Complaint Loss of Consciousness    HPI Johnathan Harvey is a 63 y.o. male with a history of GERD, recent left inguinal hernia repair, hx opioid abuse on Subutex who presents emergency department for altered mental status.  Patient was last seen normal by daughter on Tuesday.  Today, she went to check on the patient, and saw him through the window laying on the ground of his home. She banged on the windows and doors, but he was unable to get up and not really responding to her. She states once she got into his home, he did not recognize her, and she activated EMS.  EMS reports temperature 100.4, blood glucose 70s, however on arrival here he is afebrile.  Patient is able to state his name on arrival, otherwise is unable to participate in exam or provide any further history.  Caveat: history limited significantly due to patient's altered mental status, obtained primarily from EMS and daughter at bedside.  8:11 PM Progress Note: Further history obtained from daughter at bedside.  Apparently, the patient was last known and seen normal last night by a friend of his.  The daughter states that this friend often encourages illicit drug use, daughter is concerned that this might be a factor in his presentation today.  Daughter also states his recent Rx for Vicodin (for his inguinal hernia repair) was at home and his pill bottle was empty.    Past Medical Hx Past Medical History:  Diagnosis Date  . Arthritis   . Esophageal stricture   . GERD (gastroesophageal reflux disease)   . History of MRSA infection 2016   knee wound  . Hypertension    was put on medications in [place], stopped shortly after, no longer needs meds   . Skin cancer, basal cell     Problem List Patient Active Problem List   Diagnosis Date Noted  . Cellulitis and abscess of leg 07/28/2015  . HTN (hypertension) 07/28/2015  . GERD (gastroesophageal reflux disease) 07/28/2015  . Arthritis 07/28/2015  . Opiate overdose ([place]) 05/02/2015    Past Surgical Hx Past Surgical History:  Procedure Laterality Date  . ANKLE ARTHROPLASTY    . ESOPHAGOGASTRODUODENOSCOPY N/A 11/04/2018   Procedure: ESOPHAGOGASTRODUODENOSCOPY (EGD);  Surgeon: Lin Landsman, MD;  [facility]: [facility] ENDOSCOPY;  Service: Gastroenterology;  Laterality: N/A;  . INGUINAL HERNIA REPAIR Left 09/26/2019   Procedure: LEFT INGUINAL HERNIA REPAIR;  Surgeon: Johnathan Hausen, MD;  Location: [place];  Service: General;  Laterality: Left;    Medications Prior to Admission medications   Medication Sig Start Date End Date Taking? Authorizing Provider  HYDROcodone-acetaminophen (NORCO/VICODIN) 5-325 MG tablet Take 1 tablet by mouth every 6 (six) hours as needed for moderate pain. 09/26/19   Johnathan Hausen, MD  omeprazole (PRILOSEC) 40 MG capsule Take 1 capsule (40 mg total) by mouth 2 (two) times daily before a meal for 30 days. 11/05/18 09/19/19  Lin Landsman, MD    Allergies Patient has no known allergies.  Family Hx Family History  Problem Relation Age of Onset  . Cancer Mother   . Cancer Father     Social Hx Social History   Tobacco Use  . Smoking status: Former Smoker    Packs/day: 0.00  .  Smokeless tobacco: Never Used  Substance Use Topics  . Alcohol use: No    Alcohol/week: 0.0 standard drinks  . Drug use: Not Currently    Types: Marijuana    Comment: pt denies use of drugs 09/19/19     Review of Systems Unable to obtain due to AMS.   Physical Exam  Vital Signs: ED Triage Vitals [10/10/19 2050]  Enc Vitals Group     BP 123/83     Pulse Rate 99     Resp 19     Temp 98.1 F (36.7 C)     Temp Source Axillary     SpO2 98  %     Weight      Height      Head Circumference      Peak Flow      Pain Score      Pain Loc      Pain Edu?      Excl. in [place]?      Constitutional: Awake.  States name.  Does not answer any other questions. Head: Normocephalic. Atraumatic. Eyes: Conjunctivae clear. Sclera anicteric.  Pupils pinpoint, 2 mm and reactive bilaterally. Nose: No congestion. No rhinorrhea. Mouth/Throat: Wearing mask.  Neck: No stridor.   Cardiovascular: Normal rate, regular rhythm. Extremities well perfused. Respiratory: Normal respiratory effort.  Lungs CTAB. No apnea or hypoxia.  Gastrointestinal: Soft. Non-tender. Non-distended.  Musculoskeletal: No lower extremity edema. No deformities. Neurologic:  Awake.  States name.  Does not answer any other questions.  Does not consistently follow commands.  Weak, but symmetric grip strength bilaterally.  Seems to be leaning towards the right. Skin: Skin is warm, dry and intact. No rash noted. Psychiatric: Mood and affect are appropriate for situation.  EKG  Personally reviewed. Obtained 12/25 18:46.  Rate: 65 Rhythm: sinus Axis: LAD Intervals: WNL LAFB No STEMI    Radiology  CT head: IMPRESSION:  Negative CT of the head.   CXR:  IMPRESSION:  No active disease.    Procedures  Procedure(s) performed (including critical care):  .Critical Care Performed by: Lilia Pro., MD Authorized by: Lilia Pro., MD   Critical care provider statement:    Critical care time (minutes):  30   Critical care was necessary to treat or prevent imminent or life-threatening deterioration of the following conditions:  Toxidrome and CNS failure or compromise   Critical care was time spent personally by me on the following activities:  Discussions with consultants, evaluation of patient's response to treatment, examination of patient, ordering and performing treatments and interventions, ordering and review of laboratory studies, ordering and review of  radiographic studies, pulse oximetry, re-evaluation of patient's condition, obtaining history from patient or surrogate and review of old charts     Initial Impression / Assessment and Plan / ED Course  63 y.o. male who presents to the ED for AMS, as above.  Ddx: central/CVA, UTI, electrolyte abnormality, opioid or other toxicologic etiology  Will obtain labs, imaging  UDS positive for benzodiazepines and cannabis.  Acetaminophen, alcohol, salicylate levels negative.  Urine negative for infection.  No significant electrolyte derangements.  Normal lactate.  Mildly elevated, nonspecific leukocytosis.  No hypercarbia. Normal CK. CT head negative. CXR negative.   Slowly administered low dose of Narcan (0.2 mg), with improvement in his alertness. Still somnolent, but rouses more easily to voice. Pupils now 4-5 mm and reactive. Remains stable respiratory status, no apnea or hypoxia. Given this, suspect presentation due to substance use (likely opiates given  response to Narcan, as well as benzodiazepines, cannabis as noted on his UDS).  Low suspicion for meningitis or encephalitis given his response to Narcan, afebrile here.   We will continue to monitor until clinically sober, anticipate discharge at that time.  Final Clinical Impression(s) / ED Diagnosis  Final diagnoses:  Encephalopathy  Substance use       Note:  This document was prepared using Dragon voice recognition software and may include unintentional dictation errors.     Lilia Pro., MD 10/10/19 2108

## 2019-10-10 NOTE — ED Provider Notes (Signed)
-----------------------------------------   11:22 PM on 10/10/2019 -----------------------------------------  Assuming care from Dr. Jari Pigg.  In short, Johnathan Harvey is a 63 y.o. male with a chief complaint of intoxication/altered mental status.  Refer to the original H&P for additional details.  The current plan of care is to reassess when the patient is awake and sober.  Anticipate discharge home with family.   ----------------------------------------- 6:49 AM on 10/11/2019 -----------------------------------------  I checked with the patient and was able to shake him lightly to get him to wake up but he is clearly not quite ready to ambulate.  He can barely keep his eyes open and falls asleep immediately when he is not actively being engaged in conversation.  His daughter remains at his bedside.  I am transferring ED care to Dr. Archie Balboa for reassessment after the patient is more awake and able to ambulate in order to be discharged.   Hinda Kehr, MD 10/11/19 [phone]

## 2019-10-10 NOTE — ED Provider Notes (Signed)
11:15 PM reevaluated patient.  Still is altered.  Family is at bedside.  They will alert the oncoming doctor when he seems to be more at his baseline they feel that he can ambulate.   Vanessa Ramah, MD 10/10/19 [phone]

## 2019-10-11 ENCOUNTER — Emergency Department: Payer: Medicare Other

## 2019-10-11 NOTE — ED Notes (Addendum)
Pt states he is claustrophobic and needs sedation for MRI- MD states to hold off sedation due to pt lethargy/neuro status. MRI notified.

## 2019-10-11 NOTE — ED Notes (Signed)
Pt daughter to nurses station saying that pt was trying to get up. This RN to enter room pt states, "I need to go to the bathroom". Pt was confused and noticeably weak. Unable to get up at this time. Pt assisted with urinal but fell back asleep before he was able to urinate.

## 2019-10-11 NOTE — ED Notes (Signed)
Pt provided water and meal tray. Pt appears diaphoretic, is easily arousable and wakes up to voice. Turned on light and brought head of bed up to enhance pt's ability to wake up. Family at bedside.

## 2019-10-11 NOTE — ED Notes (Signed)
Daughter Crystal at [employer] wanting information about father, which she was already told by First Nurse Levada Dy that she would need to have paperwork that showed she was the medical power of attorney.    Pt upset with this RN when I reiterated what was previously told to her about the visitation policy and that we needed paperwork, daughter made the comment that "I know one person here already doesn't like me" advised daughter that I know nothing about her and was being matter of fact with her about our policy and HIPAA.    I spoke with the nurse Lexie who advised me that the daughter that was in the room had already made contact with family regarding any updates.  I advised daughter Donella Stade that her sister can update her and that she cannot wait in the lobby.  Crystal continued to state that the patient was her father and that she needed to get information about him and that he was unconscious. Advised her again that she cannot visit and cannot switch with sister. Advised her to bring copy of healthcare POA to hospital.  Crystal left lobby angry and said "you're a bitch" to me as she turned and walked away.  Updated patient's primary RN on events that occurred.

## 2019-10-11 NOTE — ED Notes (Signed)
Per Network engineer, daughter states pt is dressed and wants to go home.

## 2019-10-11 NOTE — ED Notes (Signed)
Pt able to stand up with assistance, unable to ambulate. Gait is unsteady.

## 2019-10-11 NOTE — ED Notes (Signed)
Pt is currently sleeping at this time pt is easily arousable but falls right back to sleep. Daughter at bedside.

## 2019-10-11 NOTE — ED Notes (Signed)
Patient is resting comfortably. Daughter at bedside

## 2019-10-11 NOTE — ED Notes (Signed)
Gait is slightly unsteady but pt is able to ambulate at this time without assistance, MD notified.

## 2019-10-11 NOTE — ED Notes (Signed)
Pt spoke with MRI tech via mobile phone at this time. Pt and family updated about plan of care.

## 2019-10-11 NOTE — ED Notes (Signed)
Pt ate and drank appropriately per daughter in room. Pt states he feels unsteady but is feeling better gradually.

## 2019-10-11 NOTE — ED Notes (Signed)
Patient transported to MRI 

## 2019-10-11 NOTE — ED Provider Notes (Addendum)
Patient's MRI is negative, see below.  On reassessment, he is awake, alert, oriented.  He is able to state his daughter's name, his address, where he is.  He has tolerated PO and ambulatory with a steady gait.  He does admit to benzodiazepine use yesterday. Presentation consistent with multi substance use. Discussed importance of avoiding illicit drug use in the future, he voices understanding of this.  As such, given negative work-up, and return to baseline, will proceed with discharge. Provided resources regarding substance use.   Imaging: MRI Brain IMPRESSION:  Normal MRI of the brain for age. No acute or focal lesion to explain the patient's symptoms.      Lilia Pro., MD 10/11/19 [phone]

## 2019-10-11 NOTE — ED Notes (Signed)
Patient is resting comfortably. 

## 2019-10-11 NOTE — ED Notes (Signed)
Pt back from MRI- provided snacks per his request. Pt states he tolerated MRI w/out problems.

## 2019-10-11 NOTE — Discharge Instructions (Addendum)
Your medical work-up tonight was reassuring with no sign of any emergent medical issue.  It appears that you may have taken too much medication and drugs. Including at least benzodiazepines and marijuana, which made you sleepy for an extended period of time.  Please follow-up with your regular doctor to discuss what happened and try to avoid using too many medications/drugs at the same time in the future.  Return to the emergency department if you develop new or worsening symptoms that concern you.  Please contact [facility] for substance use assistance  [address] [place], [place] [address] Phone:  [phone] or [phone]  Open Access:   Walk-in ASSESSMENT hours, M-W-F, 8:00am - 3:00pm Advanced Acess CRISIS:  M-F, 8:00am - 8:00pm Outpatient Services Office Hours:  M-F, 8:00am - 5:00pm

## 2019-10-11 NOTE — ED Notes (Signed)
Pt resting quietly.

## 2019-10-11 NOTE — ED Notes (Signed)
Attempted to ambulate patient per MD request. Patient indicated he would attempt, but then immediately fell back asleep. Patient would not open eyes. Patient began to snore. Again attempted to wake patient and ambulate him. Patient fell promptly to sleep. MD Johnathan Harvey informed.

## 2019-10-11 NOTE — ED Notes (Signed)
Daughter Crystal called at this time requesting to know if pt was dx as overdose, daughter informed that pt is alert and oriented at this time and that RN cannot diagnose or give confidential information over phone. Daughter states she is POA but does not have paperwork for such. Information about HIPAA reinforced.

## 2019-10-11 NOTE — ED Notes (Signed)
Pt assisted with urinal. Pt able to move self around in bed to assist holding urinal and pulling pants back up. Daughter at bedside.

## 2019-10-11 NOTE — ED Notes (Signed)
First Nurse Note: Pt daughter in lobby to bring pts clothes. Pt dtr requesting update on pt. Primary RN was called and phone was given to pt dtr.  After speaking with primary RN, pt dtr stated that they were not giving her any information and "I'm starting to get really pissy" and she wanted to know how she could get an update on her father. Pt states that she is pts POA and emergency contact. This RN asked if we had a copy of POA paperwork on file, pt dtr stated that we did not, that it is locked up in a safe and she is unable to get into the safe. Pt dtr was informed that without paperwork stating that she is his healthcare POA that we cannot give her any information unless authorized that it is ok by pt and that she should get an update from the pt dtr that is at bedside.   Pt dtr in lobby then asked if she could switch out with the dtr that is at bedside, this RN informed her that due to [place] restrictions that we are not allowed to switch visitors.  This RN went to pts room, where primary RN was already speaking with dtr at bedside. Dtr at bedside states that she has called the other dtr and told her everything that she knows. Lovina Reach was informed of this and relayed message to pt dtr who was in the lobby.

## 2019-10-16 LAB — CULTURE, BLOOD (ROUTINE X 2)
Culture: NO GROWTH
Culture: NO GROWTH
Special Requests: ADEQUATE
Special Requests: ADEQUATE

## 2019-10-23 ENCOUNTER — Other Ambulatory Visit: Payer: Self-pay | Admitting: Acute Care

## 2019-10-23 DIAGNOSIS — M5412 Radiculopathy, cervical region: Secondary | ICD-10-CM

## 2019-10-29 ENCOUNTER — Ambulatory Visit: Payer: Medicare Other

## 2019-11-12 ENCOUNTER — Ambulatory Visit
Admission: RE | Admit: 2019-11-12 | Discharge: 2019-11-12 | Disposition: A | Discharge status: Home / Self Care | Payer: Medicare Other | Source: Ambulatory Visit | Attending: Acute Care | Admitting: Acute Care

## 2019-11-12 ENCOUNTER — Other Ambulatory Visit: Payer: Self-pay

## 2019-11-12 DIAGNOSIS — M5412 Radiculopathy, cervical region: Secondary | ICD-10-CM | POA: Insufficient documentation

## 2020-02-04 ENCOUNTER — Encounter: Payer: Self-pay | Admitting: Emergency Medicine

## 2020-02-04 ENCOUNTER — Emergency Department
Admission: EM | Admit: 2020-02-04 | Discharge: 2020-02-04 | Disposition: A | Discharge status: Home / Self Care | Payer: Medicare Other | Attending: Student in an Organized Health Care Education/Training Program | Admitting: Student in an Organized Health Care Education/Training Program

## 2020-02-04 ENCOUNTER — Emergency Department: Payer: Medicare Other

## 2020-02-04 ENCOUNTER — Other Ambulatory Visit: Payer: Self-pay

## 2020-02-04 DIAGNOSIS — Y999 Unspecified external cause status: Secondary | ICD-10-CM | POA: Diagnosis not present

## 2020-02-04 DIAGNOSIS — Z79899 Other long term (current) drug therapy: Secondary | ICD-10-CM | POA: Diagnosis not present

## 2020-02-04 DIAGNOSIS — Y929 Unspecified place or not applicable: Secondary | ICD-10-CM | POA: Insufficient documentation

## 2020-02-04 DIAGNOSIS — Z87891 Personal history of nicotine dependence: Secondary | ICD-10-CM | POA: Diagnosis not present

## 2020-02-04 DIAGNOSIS — W231XXA Caught, crushed, jammed, or pinched between stationary objects, initial encounter: Secondary | ICD-10-CM | POA: Insufficient documentation

## 2020-02-04 DIAGNOSIS — S4991XA Unspecified injury of right shoulder and upper arm, initial encounter: Secondary | ICD-10-CM | POA: Diagnosis present

## 2020-02-04 DIAGNOSIS — S43101A Unspecified dislocation of right acromioclavicular joint, initial encounter: Secondary | ICD-10-CM | POA: Diagnosis not present

## 2020-02-04 DIAGNOSIS — Y939 Activity, unspecified: Secondary | ICD-10-CM | POA: Insufficient documentation

## 2020-02-04 DIAGNOSIS — I10 Essential (primary) hypertension: Secondary | ICD-10-CM | POA: Diagnosis not present

## 2020-02-04 MED ORDER — HYDROCODONE-ACETAMINOPHEN 5-325 MG PO TABS: 1.0000 | ORAL_TABLET | Freq: Three times a day (TID) | ORAL | 0 refills | Status: DC | PRN

## 2020-02-04 MED ORDER — IBUPROFEN 800 MG PO TABS
800.0000 mg | ORAL_TABLET | Freq: Once | ORAL | Status: AC
  Administered 2020-02-04: 20:00:00 800 mg via ORAL
  Filled 2020-02-04: qty 1

## 2020-02-04 MED ORDER — CYCLOBENZAPRINE HCL 5 MG PO TABS: 5.0000 mg | ORAL_TABLET | Freq: Three times a day (TID) | ORAL | 0 refills | Status: DC | PRN

## 2020-02-04 MED ORDER — IBUPROFEN 800 MG PO TABS: 800.0000 mg | ORAL_TABLET | Freq: Three times a day (TID) | ORAL | 0 refills | Status: DC | PRN

## 2020-02-04 NOTE — ED Provider Notes (Signed)
[facility] Emergency Department Provider Note ____________________________________________  Time seen: 1953  I have reviewed the triage vital signs and the nursing notes.  HISTORY  Chief Complaint  Shoulder Injury  HPI Johnathan Harvey is a 64 y.o. male presents to the ED for acute right shoulder pain.  Patient admits to working underneath his car by himself, when apparently  the car fell on his shoulder after he ejected up.  He was able to himself mother the car, but noted pain to the right shoulder but he denies any head injury, loss of consciousness, chest pain, shortness of breath.  Patient presents with disability to the right shoulder.  Past Medical History:  Diagnosis Date  . Arthritis   . Esophageal stricture   . GERD (gastroesophageal reflux disease)   . History of MRSA infection 2016   knee wound  . Hypertension    was put on medications in [place], stopped shortly after, no longer needs meds  . Skin cancer, basal cell     Patient Active Problem List   Diagnosis Date Noted  . Cellulitis and abscess of leg 07/28/2015  . HTN (hypertension) 07/28/2015  . GERD (gastroesophageal reflux disease) 07/28/2015  . Arthritis 07/28/2015  . Opiate overdose ([place]) 05/02/2015    Past Surgical History:  Procedure Laterality Date  . ANKLE ARTHROPLASTY    . ESOPHAGOGASTRODUODENOSCOPY N/A 11/04/2018   Procedure: ESOPHAGOGASTRODUODENOSCOPY (EGD);  Surgeon: Lin Landsman, MD;  Location: [facility] ENDOSCOPY;  Service: Gastroenterology;  Laterality: N/A;  . INGUINAL HERNIA REPAIR Left 09/26/2019   Procedure: LEFT INGUINAL HERNIA REPAIR;  Surgeon: Johnathan Hausen, MD;  Location: [place];  Service: General;  Laterality: Left;    Prior to Admission medications   Medication Sig Start Date End Date Taking? Authorizing Provider  cyclobenzaprine (FLEXERIL) 5 MG tablet Take 1 tablet (5 mg total) by mouth 3 (three) times daily as needed. 02/04/20    Georgianna Band, Dannielle Karvonen, PA-C  HYDROcodone-acetaminophen (NORCO) 5-325 MG tablet Take 1 tablet by mouth 3 (three) times daily as needed for up to 2 days. 02/04/20 02/06/20  Velton Roselle, Dannielle Karvonen, PA-C  ibuprofen (ADVIL) 800 MG tablet Take 1 tablet (800 mg total) by mouth every 8 (eight) hours as needed. 02/04/20   Jayra Choyce, Dannielle Karvonen, PA-C  omeprazole (PRILOSEC) 40 MG capsule Take 1 capsule (40 mg total) by mouth 2 (two) times daily before a meal for 30 days. 11/05/18 09/19/19  Lin Landsman, MD    Allergies Patient has no known allergies.  Family History  Problem Relation Age of Onset  . Cancer Mother   . Cancer Father     Social History Social History   Tobacco Use  . Smoking status: Former Smoker    Packs/day: 0.00  . Smokeless tobacco: Never Used  Substance Use Topics  . Alcohol use: No    Alcohol/week: 0.0 standard drinks  . Drug use: Not Currently    Types: Marijuana    Comment: pt denies use of drugs 09/19/19    Review of Systems  Constitutional: Negative for fever. Eyes: Negative for visual changes. ENT: Negative for sore throat. Cardiovascular: Negative for chest pain. Respiratory: Negative for shortness of breath. Gastrointestinal: Negative for abdominal pain, vomiting and diarrhea. Genitourinary: Negative for dysuria. Musculoskeletal: Negative for back pain.  Right shoulder pain and disability as above. Skin: Negative for rash. Neurological: Negative for headaches, focal weakness or numbness. ____________________________________________  PHYSICAL EXAM:  VITAL SIGNS: ED Triage Vitals  Enc  Vitals Group     BP 02/04/20 1838 (!) 138/109     Pulse Rate 02/04/20 1836 62     Resp 02/04/20 1836 20     Temp 02/04/20 1836 97.8 F (36.6 C)     Temp Source 02/04/20 1836 Oral     SpO2 02/04/20 1836 99 %     Weight 02/04/20 1836 125 lb (56.7 kg)     Height 02/04/20 1836 5\' 10"  (1.778 m)     Head Circumference --      Peak Flow --      Pain Score 02/04/20  1836 9     Pain Loc --      Pain Edu? --      Excl. in [place]? --     Constitutional: Alert and oriented. Well appearing and in no distress. Head: Normocephalic and atraumatic. Eyes: Conjunctivae are normal. Normal extraocular movements Neck: Supple.  Normal range of motion.  No midline tenderness is appreciated. Cardiovascular: Normal rate, regular rhythm. Normal distal pulses. Respiratory: Normal respiratory effort. No wheezes/rales/rhonchi. Musculoskeletal: Right shoulder without obvious deformity or dislocation.  Patient with tenderness palpation to the [facility] joint.  There is a palpable 1 cm separation at the same region.  Nontender with normal range of motion in all extremities.  Neurologic: Cranial nerves II through XII grossly intact.  Normal composite fist bilaterally.  Normal gait without ataxia. Normal speech and language. No gross focal neurologic deficits are appreciated. Skin:  Skin is warm, dry and intact. No rash noted. ___________________________________________   RADIOLOGY  DG Right Shoulder  IMPRESSION: 1. Questionable widening of the acromioclavicular joint at the acromioclavicular joint, suggesting AC separation. No acute fracture. 2. 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 ____________________________________________  PROCEDURES  IBU 800 mg PO Arm sling  Procedures ____________________________________________  INITIAL IMPRESSION / ASSESSMENT AND PLAN / ED COURSE  Patient with ED evaluation of acute right shoulder pain and disability concerning for fracture.  X-ray reveals AC separation secondary to the mechanism of injury.  Patient is placed in an arm sling for support.  Prescriptions for cyclobenzaprine, hydrocodone, and ibuprofen are provided.  He is referred to Ortho for ongoing symptom management.  Return precautions have been reviewed.  [name] was evaluated in Emergency Department on 02/04/2020 for the  symptoms described in the history of present illness. He was evaluated in the context of the global COVID-19 pandemic, which necessitated consideration that the patient might be at risk for infection with the SARS-CoV-2 virus that causes COVID-19. Institutional protocols and algorithms that pertain to the evaluation of patients at risk for COVID-19 are in a state of rapid change based on information released by regulatory bodies including the CDC and federal and state organizations. These policies and algorithms were followed during the patient's care in the ED.  I reviewed the patient's prescription history over the last 12 months in the multi-state controlled substances database(s) that includes [place], [place], [place], [place], [place], [place], [place], [place], [place], [place], [place], [place], [place], and [place].  Results were notable for no current RX. ____________________________________________  FINAL CLINICAL IMPRESSION(S) / ED DIAGNOSES  Final diagnoses:  Separation of right acromioclavicular joint, initial encounter      Melvenia Needles, PA-C 02/04/20 2345    Merlyn Lot, MD 02/04/20 [phone]

## 2020-02-04 NOTE — Discharge Instructions (Addendum)
You are being treated for an [facility] separation. There is no evidence of fracture or dislocation to the shoulder. Take the prescription meds as directed. Follow-up with Dr. Posey Pronto for further treatment. Wear the sling for comfort and support. Apply ice to reduce swelling.

## 2020-02-04 NOTE — ED Notes (Signed)
Pt not in room, pt's mobile phone and home phone called multiple times to contact patient for discharge and possibility to pick up paperwork. This RN was unsuccessful at this time. Per PA Jenise, pt did have understanding of DC instructions from her stand point.

## 2020-02-04 NOTE — ED Triage Notes (Signed)
Pt reports was working on his car, jacked it up and then it fell on his right shoulder. Pulse present. Pt able to move arm but c/o pain

## 2020-09-07 DIAGNOSIS — M71162 Other infective bursitis, left knee: Secondary | ICD-10-CM | POA: Insufficient documentation

## 2020-12-07 ENCOUNTER — Emergency Department
Admission: EM | Admit: 2020-12-07 | Discharge: 2020-12-07 | Disposition: A | Discharge status: Home / Self Care | Payer: Medicare Other | Attending: Emergency Medicine | Admitting: Emergency Medicine

## 2020-12-07 ENCOUNTER — Emergency Department: Payer: Medicare Other

## 2020-12-07 ENCOUNTER — Other Ambulatory Visit: Payer: Self-pay

## 2020-12-07 ENCOUNTER — Encounter: Payer: Self-pay | Admitting: Emergency Medicine

## 2020-12-07 DIAGNOSIS — Z87891 Personal history of nicotine dependence: Secondary | ICD-10-CM | POA: Insufficient documentation

## 2020-12-07 DIAGNOSIS — S0990XA Unspecified injury of head, initial encounter: Secondary | ICD-10-CM | POA: Diagnosis present

## 2020-12-07 DIAGNOSIS — Z85828 Personal history of other malignant neoplasm of skin: Secondary | ICD-10-CM | POA: Diagnosis not present

## 2020-12-07 DIAGNOSIS — M25532 Pain in left wrist: Secondary | ICD-10-CM | POA: Insufficient documentation

## 2020-12-07 DIAGNOSIS — M542 Cervicalgia: Secondary | ICD-10-CM | POA: Insufficient documentation

## 2020-12-07 DIAGNOSIS — I10 Essential (primary) hypertension: Secondary | ICD-10-CM | POA: Diagnosis not present

## 2020-12-07 DIAGNOSIS — Y990 Civilian activity done for income or pay: Secondary | ICD-10-CM | POA: Diagnosis not present

## 2020-12-07 DIAGNOSIS — W173XXA Fall into empty swimming pool, initial encounter: Secondary | ICD-10-CM | POA: Insufficient documentation

## 2020-12-07 MED ORDER — NAPROXEN 375 MG PO TABS: 375.0000 mg | ORAL_TABLET | Freq: Two times a day (BID) | ORAL | 0 refills | Status: DC

## 2020-12-07 MED ORDER — TRAMADOL HCL 50 MG PO TABS: 50.0000 mg | ORAL_TABLET | Freq: Three times a day (TID) | ORAL | 0 refills | Status: DC | PRN

## 2020-12-07 MED ORDER — CYCLOBENZAPRINE HCL 10 MG PO TABS: 10.0000 mg | ORAL_TABLET | Freq: Three times a day (TID) | ORAL | 0 refills | Status: DC | PRN

## 2020-12-07 MED ORDER — IBUPROFEN 800 MG PO TABS
800.0000 mg | ORAL_TABLET | Freq: Once | ORAL | Status: AC
  Administered 2020-12-07: 800 mg via ORAL
  Filled 2020-12-07: qty 1

## 2020-12-07 NOTE — ED Provider Notes (Signed)
[facility] Emergency Department Provider Note ____________________________________________  Time seen: 1030  I have reviewed the triage vital signs and the nursing notes.  HISTORY  Chief Complaint  Fall, Head Injury, Neck Pain, and L wrist pain   HPI Johnathan Harvey is a 65 y.o. male presents to the ER today with complaint of acute neck pain and acute left wrist pain status post a 7 foot fall into a concrete pool about 1 hour prior to arrival.  He reports he was doing some maintenance on the pool, tripped on the edge and landed on his back in the bottom of the pool.  He reports the neck pain is sore and achy.  The pain does not radiate.  He denies headache, dizziness, visual changes, nausea or vomiting.  He denies numbness, tingling or weakness of his upper extremities.  He does report some left wrist pain which he describes as intermittently sharp, worse with certain movements.  He denies numbness, tingling or weakness of his left upper extremity.  He has not taken any medications for this PTA.  Past Medical History:  Diagnosis Date  . Arthritis   . Esophageal stricture   . GERD (gastroesophageal reflux disease)   . History of MRSA infection 2016   knee wound  . Hypertension    was put on medications in [place], stopped shortly after, no longer needs meds  . Skin cancer, basal cell     Patient Active Problem List   Diagnosis Date Noted  . Cellulitis and abscess of leg 07/28/2015  . HTN (hypertension) 07/28/2015  . GERD (gastroesophageal reflux disease) 07/28/2015  . Arthritis 07/28/2015  . Opiate overdose ([place]) 05/02/2015    Past Surgical History:  Procedure Laterality Date  . ANKLE ARTHROPLASTY    . ESOPHAGOGASTRODUODENOSCOPY N/A 11/04/2018   Procedure: ESOPHAGOGASTRODUODENOSCOPY (EGD);  Surgeon: Lin Landsman, MD;  Location: [facility] ENDOSCOPY;  Service: Gastroenterology;  Laterality: N/A;  . INGUINAL HERNIA REPAIR Left 09/26/2019    Procedure: LEFT INGUINAL HERNIA REPAIR;  Surgeon: Johnathan Hausen, MD;  Location: [place];  Service: General;  Laterality: Left;    Prior to Admission medications   Medication Sig Start Date End Date Taking? Authorizing Provider  albuterol (VENTOLIN HFA) 108 (90 Base) MCG/ACT inhaler Inhale 2 puffs into the lungs every 6 (six) hours as needed for wheezing or shortness of breath.   Yes [provider]  cyclobenzaprine (FLEXERIL) 10 MG tablet Take 1 tablet (10 mg total) by mouth 3 (three) times daily as needed for muscle spasms. 12/07/20  Yes Jearld Fenton, NP  gabapentin (NEURONTIN) 100 MG capsule Take 100 mg by mouth 3 (three) times daily.   Yes [provider]  naproxen (NAPROSYN) 375 MG tablet Take 1 tablet (375 mg total) by mouth 2 (two) times daily with a meal. 12/07/20  Yes Kallan Bischoff, Coralie Keens, NP  traMADol (ULTRAM) 50 MG tablet Take 1 tablet (50 mg total) by mouth every 8 (eight) hours as needed. 12/07/20 12/07/21 Yes Mathew Storck, Coralie Keens, NP  ibuprofen (ADVIL) 800 MG tablet Take 1 tablet (800 mg total) by mouth every 8 (eight) hours as needed. 02/04/20   Menshew, Dannielle Karvonen, PA-C  omeprazole (PRILOSEC) 40 MG capsule Take 1 capsule (40 mg total) by mouth 2 (two) times daily before a meal for 30 days. 11/05/18 09/19/19  Lin Landsman, MD    Allergies Patient has no known allergies.  Family History  Problem Relation Age of Onset  . Cancer  Mother   . Cancer Father     Social History Social History   Tobacco Use  . Smoking status: Former Smoker    Packs/day: 0.00  . Smokeless tobacco: Never Used  Substance Use Topics  . Alcohol use: No    Alcohol/week: 0.0 standard drinks  . Drug use: Not Currently    Types: Marijuana    Comment: pt denies use of drugs 09/19/19    Review of Systems  Constitutional: Negative for fever, chills or body aches. Eyes: Negative for visual changes. Cardiovascular: Negative for chest pain or chest  tightness. Respiratory: Negative for cough or shortness of breath. Gastrointestinal: Negative for nausea or vomiting. Musculoskeletal: Positive for neck pain and left wrist pain.  Negative for back, shoulder or elbow pain. Skin: Negative for abrasion or bruising. Neurological: Negative for headaches,, dizziness, focal weakness, tingling or numbness. ____________________________________________  PHYSICAL EXAM:  VITAL SIGNS: ED Triage Vitals  Enc Vitals Group     BP 12/07/20 0957 (!) 143/90     Pulse Rate 12/07/20 0957 65     Resp 12/07/20 0957 18     Temp 12/07/20 0957 98.1 F (36.7 C)     Temp Source 12/07/20 0957 Oral     SpO2 12/07/20 0957 99 %     Weight 12/07/20 0954 130 lb (59 kg)     Height 12/07/20 1039 5\' 10"  (1.778 m)     Head Circumference --      Peak Flow --      Pain Score --      Pain Loc --      Pain Edu? --      Excl. in [place]? --     Constitutional: Alert and oriented. Well appearing and in no distress. Head: Normocephalic and atraumatic. Eyes:  PERRL. Normal extraocular movements Cardiovascular: Normal rate, regular rhythm.  Radial pulses 2+ bilaterally Respiratory: Normal respiratory effort. No wheezes/rales/rhonchi. Musculoskeletal: In hard collar Neurologic: Normal speech and language. No gross focal neurologic deficits are appreciated. Skin:  Skin is warm, dry and intact. No bruising or abrasion noted. ____________________________________________  RADIOLOGY  Imaging Orders     CT Head Wo Contrast     CT Cervical Spine Wo Contrast     DG Wrist Complete Left   IMPRESSION: No fracture or dislocation. Osteoarthritic change in the second and third MCP joints. Other joint spaces appear unremarkable. Benign cyst in distal scaphoid.   IMPRESSION: CT head: Normal appearing brain parenchyma.  No mass or hemorrhage.  Foci of arterial vascular calcification noted. Mild mucosal thickening noted in several ethmoid air cells.  CT cervical spine:  1.  No  appreciable fracture or spondylolisthesis.  2. Multilevel arthropathy with exit foraminal narrowing at several levels as noted. No frank disc extrusion or stenosis.  3.  Calcification in the proximal left subclavian artery.   ____________________________________________  INITIAL IMPRESSION / ASSESSMENT AND PLAN / ED COURSE  Acute Neck Pain, Left Wrist Pain s/p Fall into Empty Swimming Pool:  CT head negative CT cervical spine negative Xray left wrist negative  Ibuprofen 800 mg PO x 1 Unable to have pain medication or muscle relaxers in the ER- he drove himself here RX for Naproxen 375 mg BID x 5 days RX for Flexeril 10 mg TID prn- sedation caution given RX for Tramadol 50 mg TID prn- addiction caution given Encouraged rest, ice and stretching Return precautions discussed     I reviewed the patient's prescription history over the last 12 months in the multi-state  controlled substances database(s) that includes [place], [place], [place], [place], [place], [place], [place], [place], [place], [place], [place], [place], [place], and [place].  Results were notable for Oxycodone 5 mg IR #10, 08/2020. ____________________________________________  FINAL CLINICAL IMPRESSION(S) / ED DIAGNOSES  Final diagnoses:  Acute neck pain  Injury of head, initial encounter  Left wrist pain  Fall into empty swimming pool, initial encounter      Jearld Fenton, NP 12/07/20 [place]    Vanessa [place], MD 12/07/20 [phone]

## 2020-12-07 NOTE — ED Triage Notes (Signed)
Pt comes with c/o neck and left wrist pain. Pt states he fell 7 feet from a ladder while at work. Pt placed in neck collar at this time. Pt denies any LOC. Pt did hit his head. Pt denies any blood thinners.

## 2020-12-07 NOTE — Discharge Instructions (Addendum)
You were seen today for acute neck pain, head injury and left wrist pain status post falling into full.  Your CT head was negative for acute findings.  Your CT cervical spine was negative for acute findings.  Your x-ray of your left wrist was negative for acute findings.  I am sending you home with prescriptions for anti-inflammatories, muscle relaxers and pain medication.  Please take as prescribed.  I recommend ice, rest and stretching.  Return to the ER for increased headache, dizziness, visual changes, nausea, vomiting, confusion.

## 2020-12-07 NOTE — ED Triage Notes (Addendum)
Pt in after 42ft fall into edge of empty cement pool while at work today. States he tripped along edge, landed onto back and hit posterior head, and lost his breath for a few seconds. No thinners, no LOC. Is c/o neck tenderness. Was ambulatory on scene. c-collar present. Also having L wrist pain. Breathing e/u in triage

## 2020-12-07 NOTE — ED Notes (Signed)
See triage note  Presents s/p fall    States he fell approx 7 ft into a pool  Hit his head and left wrist area   Also having pain to neck

## 2020-12-29 ENCOUNTER — Other Ambulatory Visit: Payer: Self-pay

## 2020-12-29 ENCOUNTER — Emergency Department
Admission: EM | Admit: 2020-12-29 | Discharge: 2020-12-29 | Disposition: A | Discharge status: Home / Self Care | Payer: Medicare Other | Attending: Emergency Medicine | Admitting: Emergency Medicine

## 2020-12-29 DIAGNOSIS — R109 Unspecified abdominal pain: Secondary | ICD-10-CM | POA: Insufficient documentation

## 2020-12-29 DIAGNOSIS — Z87891 Personal history of nicotine dependence: Secondary | ICD-10-CM | POA: Diagnosis not present

## 2020-12-29 DIAGNOSIS — Z96669 Presence of unspecified artificial ankle joint: Secondary | ICD-10-CM | POA: Insufficient documentation

## 2020-12-29 DIAGNOSIS — Z85828 Personal history of other malignant neoplasm of skin: Secondary | ICD-10-CM | POA: Insufficient documentation

## 2020-12-29 DIAGNOSIS — I10 Essential (primary) hypertension: Secondary | ICD-10-CM | POA: Insufficient documentation

## 2020-12-29 DIAGNOSIS — R112 Nausea with vomiting, unspecified: Secondary | ICD-10-CM | POA: Diagnosis present

## 2020-12-29 DIAGNOSIS — R197 Diarrhea, unspecified: Secondary | ICD-10-CM | POA: Diagnosis not present

## 2020-12-29 LAB — URINALYSIS, ROUTINE W REFLEX MICROSCOPIC
Bacteria, UA: NONE SEEN
Bilirubin Urine: NEGATIVE
Glucose, UA: NEGATIVE mg/dL
Ketones, ur: 20 mg/dL — AB
Leukocytes,Ua: NEGATIVE
Nitrite: NEGATIVE
Protein, ur: 30 mg/dL — AB
Specific Gravity, Urine: 1.03 (ref 1.005–1.030)
Squamous Epithelial / HPF: NONE SEEN (ref 0–5)
pH: 5 (ref 5.0–8.0)

## 2020-12-29 LAB — CBC WITH DIFFERENTIAL/PLATELET
Abs Immature Granulocytes: 0.04 10*3/uL (ref 0.00–0.07)
Basophils Absolute: 0 10*3/uL (ref 0.0–0.1)
Basophils Relative: 0 %
Eosinophils Absolute: 0 10*3/uL (ref 0.0–0.5)
Eosinophils Relative: 0 %
HCT: 44.5 % (ref 39.0–52.0)
Hemoglobin: 15.5 g/dL (ref 13.0–17.0)
Immature Granulocytes: 0 %
Lymphocytes Relative: 15 %
Lymphs Abs: 1.4 10*3/uL (ref 0.7–4.0)
MCH: 31.6 pg (ref 26.0–34.0)
MCHC: 34.8 g/dL (ref 30.0–36.0)
MCV: 90.8 fL (ref 80.0–100.0)
Monocytes Absolute: 0.9 10*3/uL (ref 0.1–1.0)
Monocytes Relative: 10 %
Neutro Abs: 6.8 10*3/uL (ref 1.7–7.7)
Neutrophils Relative %: 75 %
Platelets: 357 10*3/uL (ref 150–400)
RBC: 4.9 MIL/uL (ref 4.22–5.81)
RDW: 12.2 % (ref 11.5–15.5)
WBC: 9.1 10*3/uL (ref 4.0–10.5)
nRBC: 0 % (ref 0.0–0.2)

## 2020-12-29 LAB — BASIC METABOLIC PANEL
Anion gap: 12 (ref 5–15)
BUN: 20 mg/dL (ref 8–23)
CO2: 22 mmol/L (ref 22–32)
Calcium: 9.2 mg/dL (ref 8.9–10.3)
Chloride: 102 mmol/L (ref 98–111)
Creatinine, Ser: 0.85 mg/dL (ref 0.61–1.24)
GFR, Estimated: >60 mL/min (ref >60)
Glucose, Bld: 128 mg/dL — ABNORMAL HIGH (ref 70–99)
Potassium: 3.3 mmol/L — ABNORMAL LOW (ref 3.5–5.1)
Sodium: 136 mmol/L (ref 135–145)

## 2020-12-29 LAB — HEPATIC FUNCTION PANEL
ALT: 12 U/L (ref 0–44)
AST: 19 U/L (ref 15–41)
Albumin: 4.4 g/dL (ref 3.5–5.0)
Alkaline Phosphatase: 108 U/L (ref 38–126)
Bilirubin, Direct: 0.4 mg/dL — ABNORMAL HIGH (ref 0.0–0.2)
Indirect Bilirubin: 2.7 mg/dL — ABNORMAL HIGH (ref 0.3–0.9)
Total Bilirubin: 3.1 mg/dL — ABNORMAL HIGH (ref 0.3–1.2)
Total Protein: 7.5 g/dL (ref 6.5–8.1)

## 2020-12-29 LAB — TROPONIN I (HIGH SENSITIVITY): Troponin I (High Sensitivity): 4 ng/L (ref <18)

## 2020-12-29 LAB — LIPASE, BLOOD: Lipase: 28 U/L (ref 11–51)

## 2020-12-29 MED ORDER — ONDANSETRON HCL 4 MG/2ML IJ SOLN
4.0000 mg | Freq: Once | INTRAMUSCULAR | Status: AC
  Administered 2020-12-29: 4 mg via INTRAVENOUS
  Filled 2020-12-29: qty 2

## 2020-12-29 MED ORDER — FAMOTIDINE IN NACL 20-0.9 MG/50ML-% IV SOLN
20.0000 mg | Freq: Once | INTRAVENOUS | Status: AC
  Administered 2020-12-29: 20 mg via INTRAVENOUS
  Filled 2020-12-29: qty 50

## 2020-12-29 MED ORDER — ONDANSETRON 4 MG PO TBDP: 4.0000 mg | ORAL_TABLET | Freq: Three times a day (TID) | ORAL | 0 refills | Status: DC | PRN

## 2020-12-29 MED ORDER — SODIUM CHLORIDE 0.9 % IV BOLUS
1000.0000 mL | Freq: Once | INTRAVENOUS | Status: AC
  Administered 2020-12-29: 1000 mL via INTRAVENOUS

## 2020-12-29 NOTE — ED Triage Notes (Signed)
Pt to ED via EMS from home. Pt states he has had n/v/d since Monday morning. Pt also c/o generalized abdominal pain. Pt was given 4mg  Zofran IM by ems.

## 2020-12-29 NOTE — ED Provider Notes (Signed)
[facility] Emergency Department Provider Note   ____________________________________________   Event Date/Time   First MD Initiated Contact with Patient 12/29/20 [id number]     (approximate)  I have reviewed the triage vital signs and the nursing notes.   HISTORY  Chief Complaint Emesis    HPI Johnathan Harvey is a 65 y.o. male brought to the ED via EMS from home with a chief complaint of nausea/vomiting/diarrhea.  Patient reports Sunday night he ate fish and had a few drinks.  Has been having vomiting and diarrhea since Monday morning, approximately 1.5 days.  Complains of dry heaves and acid reflux.  Denies fever, cough, chest pain, shortness of breath, dysuria.  Denies recent travel or trauma.     Past Medical History:  Diagnosis Date  . Arthritis   . Esophageal stricture   . GERD (gastroesophageal reflux disease)   . History of MRSA infection 2016   knee wound  . Hypertension    was put on medications in [place], stopped shortly after, no longer needs meds  . Skin cancer, basal cell     Patient Active Problem List   Diagnosis Date Noted  . Cellulitis and abscess of leg 07/28/2015  . HTN (hypertension) 07/28/2015  . GERD (gastroesophageal reflux disease) 07/28/2015  . Arthritis 07/28/2015  . Opiate overdose ([place]) 05/02/2015    Past Surgical History:  Procedure Laterality Date  . ANKLE ARTHROPLASTY    . ESOPHAGOGASTRODUODENOSCOPY N/A 11/04/2018   Procedure: ESOPHAGOGASTRODUODENOSCOPY (EGD);  Surgeon: Lin Landsman, MD;  Location: [facility] ENDOSCOPY;  Service: Gastroenterology;  Laterality: N/A;  . INGUINAL HERNIA REPAIR Left 09/26/2019   Procedure: LEFT INGUINAL HERNIA REPAIR;  Surgeon: Johnathan Hausen, MD;  Location: [place];  Service: General;  Laterality: Left;    Prior to Admission medications   Medication Sig Start Date End Date Taking? Authorizing Provider  ondansetron (ZOFRAN ODT) 4 MG disintegrating tablet  Take 1 tablet (4 mg total) by mouth every 8 (eight) hours as needed for nausea or vomiting. 12/29/20  Yes Paulette Blanch, MD  albuterol (VENTOLIN HFA) 108 (90 Base) MCG/ACT inhaler Inhale 2 puffs into the lungs every 6 (six) hours as needed for wheezing or shortness of breath.    [provider]  cyclobenzaprine (FLEXERIL) 10 MG tablet Take 1 tablet (10 mg total) by mouth 3 (three) times daily as needed for muscle spasms. 12/07/20   Jearld Fenton, NP  gabapentin (NEURONTIN) 100 MG capsule Take 100 mg by mouth 3 (three) times daily.    [provider]  ibuprofen (ADVIL) 800 MG tablet Take 1 tablet (800 mg total) by mouth every 8 (eight) hours as needed. 02/04/20   Menshew, Dannielle Karvonen, PA-C  naproxen (NAPROSYN) 375 MG tablet Take 1 tablet (375 mg total) by mouth 2 (two) times daily with a meal. 12/07/20   Baity, Coralie Keens, NP  omeprazole (PRILOSEC) 40 MG capsule Take 1 capsule (40 mg total) by mouth 2 (two) times daily before a meal for 30 days. 11/05/18 09/19/19  Lin Landsman, MD  traMADol (ULTRAM) 50 MG tablet Take 1 tablet (50 mg total) by mouth every 8 (eight) hours as needed. 12/07/20 12/07/21  Jearld Fenton, NP    Allergies Patient has no known allergies.  Family History  Problem Relation Age of Onset  . Cancer Mother   . Cancer Father     Social History Social History   Tobacco Use  . Smoking status: Former Smoker  Packs/day: 0.00  . Smokeless tobacco: Never Used  Substance Use Topics  . Alcohol use: No    Alcohol/week: 0.0 standard drinks  . Drug use: Not Currently    Types: Marijuana    Comment: pt denies use of drugs 09/19/19    Review of Systems  Constitutional: No fever/chills Eyes: No visual changes. ENT: No sore throat. Cardiovascular: Denies chest pain. Respiratory: Denies shortness of breath. Gastrointestinal: Positive for abdominal burning, nausea, vomiting and diarrhea.  No constipation. Genitourinary: Negative for  dysuria. Musculoskeletal: Negative for back pain. Skin: Negative for rash. Neurological: Negative for headaches, focal weakness or numbness.   ____________________________________________   PHYSICAL EXAM:  VITAL SIGNS: ED Triage Vitals  Enc Vitals Group     BP 12/29/20 0540 (!) 154/103     Pulse Rate 12/29/20 0540 76     Resp 12/29/20 0540 20     Temp 12/29/20 0540 98.9 F (37.2 C)     Temp Source 12/29/20 0540 Oral     SpO2 12/29/20 0540 99 %     Weight 12/29/20 0539 130 lb (59 kg)     Height 12/29/20 0539 5\' 10"  (1.778 m)     Head Circumference --      Peak Flow --      Pain Score 12/29/20 0539 0     Pain Loc --      Pain Edu? --      Excl. in [place]? --     Constitutional: Alert and oriented. Well appearing and in mild acute distress. Eyes: Conjunctivae are normal. PERRL. EOMI. Head: Atraumatic. Nose: No congestion/rhinnorhea. Mouth/Throat: Mucous membranes are mildly dry.   Neck: No stridor.   Cardiovascular: Normal rate, regular rhythm. Grossly normal heart sounds.  Good peripheral circulation. Respiratory: Normal respiratory effort.  No retractions. Lungs CTAB. Gastrointestinal: Soft and nontender to light or deep palpation. No distention. No abdominal bruits. No CVA tenderness. Musculoskeletal: No lower extremity tenderness nor edema.  No joint effusions. Neurologic:  Normal speech and language. No gross focal neurologic deficits are appreciated. No gait instability. Skin:  Skin is warm, dry and intact. No rash noted. Psychiatric: Mood and affect are normal. Speech and behavior are normal.  ____________________________________________   LABS (all labs ordered are listed, but only abnormal results are displayed)  Labs Reviewed  BASIC METABOLIC PANEL - Abnormal; Notable for the following components:      Result Value   Potassium 3.3 (*)    Glucose, Bld 128 (*)    All other components within normal limits  CBC WITH DIFFERENTIAL/PLATELET  URINALYSIS, ROUTINE W  REFLEX MICROSCOPIC  HEPATIC FUNCTION PANEL  LIPASE, BLOOD  TROPONIN I (HIGH SENSITIVITY)   ____________________________________________  EKG  ED ECG REPORT I, Ciin Brazzel J, the attending physician, personally viewed and interpreted this ECG.   Date: 12/29/2020  EKG Time: 0629  Rate: 59  Rhythm: normal EKG, normal sinus rhythm  Axis: LAD  Intervals:none  ST&T Change: Nonspecific  ____________________________________________  RADIOLOGY I, Milliana Reddoch J, personally viewed and evaluated these images (plain radiographs) as part of my medical decision making, as well as reviewing the written report by the radiologist.  ED MD interpretation: None  Official radiology report(s): No results found.  ____________________________________________   PROCEDURES  Procedure(s) performed (including Critical Care):  Procedures   ____________________________________________   INITIAL IMPRESSION / ASSESSMENT AND PLAN / ED COURSE  As part of my medical decision making, I reviewed the following data within the [facility] notes reviewed and incorporated,  Labs reviewed, EKG interpreted, Old chart reviewed and Notes from prior ED visits     65 year old male presenting with abdominal burning, nausea/vomiting/diarrhea. Differential diagnosis includes, but is not limited to, acute appendicitis, renal colic, testicular torsion, urinary tract infection/pyelonephritis, prostatitis,  epididymitis, diverticulitis, small bowel obstruction or ileus, colitis, abdominal aortic aneurysm, gastroenteritis, hernia, etc.  We will obtain basic lab work, EKG, troponin.  Initiate IV fluid resuscitation, antiemetic, Pepcid.  Will reassess.  Clinical Course as of 12/29/20 0705  Wed Dec 29, 2020  0705 Care transferred to Dr. Charlsie Quest at change of shift pending lab results and PO challenge. Anticipate discharge home if unremarkable. Would discharge home with as needed Zofran. [JS]     Clinical Course User Index [JS] Paulette Blanch, MD     ____________________________________________   FINAL CLINICAL IMPRESSION(S) / ED DIAGNOSES  Final diagnoses:  Nausea vomiting and diarrhea     ED Discharge Orders         Ordered    ondansetron (ZOFRAN ODT) 4 MG disintegrating tablet  Every 8 hours PRN        12/29/20 0272          *Please note:  Johnathan Harvey was evaluated in Emergency Department on 12/29/2020 for the symptoms described in the history of present illness. He was evaluated in the context of the global COVID-19 pandemic, which necessitated consideration that the patient might be at risk for infection with the SARS-CoV-2 virus that causes COVID-19. Institutional protocols and algorithms that pertain to the evaluation of patients at risk for COVID-19 are in a state of rapid change based on information released by regulatory bodies including the CDC and federal and state organizations. These policies and algorithms were followed during the patient's care in the ED.  Some ED evaluations and interventions may be delayed as a result of limited staffing during and the pandemic.*   Note:  This document was prepared using Dragon voice recognition software and may include unintentional dictation errors.   Paulette Blanch, MD 12/29/20 [phone]

## 2020-12-29 NOTE — ED Provider Notes (Addendum)
  Patient received in signout from Dr. Beather Arbour pending reassessment and p.o. challenge.  Patient did require an additional dose of IV Zofran due to intermittent nausea preceding his p.o. challenge, but his p.o. challenge was well-tolerated.  Per original plan of care, plan for outpatient management with Zofran as needed at home.   Vladimir Crofts, MD 12/29/20 [id number]    Vladimir Crofts, MD 12/29/20 [phone]

## 2020-12-29 NOTE — ED Notes (Signed)
Pt given PO challenge.

## 2020-12-29 NOTE — Discharge Instructions (Addendum)
1.  You may take Zofran as needed for nausea/vomiting. 2.  Clear liquids x12 hours, then bland diet x3 days, then slowly advance diet as tolerated. 3.  Return to the ER for worsening symptoms, persistent vomiting, difficulty breathing or other concerns.

## 2021-01-13 ENCOUNTER — Emergency Department: Payer: Medicare Other

## 2021-01-13 ENCOUNTER — Emergency Department
Admission: EM | Admit: 2021-01-13 | Discharge: 2021-01-13 | Disposition: A | Discharge status: Home / Self Care | Payer: Medicare Other | Attending: Emergency Medicine | Admitting: Emergency Medicine

## 2021-01-13 ENCOUNTER — Other Ambulatory Visit: Payer: Self-pay

## 2021-01-13 DIAGNOSIS — Z87891 Personal history of nicotine dependence: Secondary | ICD-10-CM | POA: Insufficient documentation

## 2021-01-13 DIAGNOSIS — K567 Ileus, unspecified: Secondary | ICD-10-CM | POA: Diagnosis not present

## 2021-01-13 DIAGNOSIS — R112 Nausea with vomiting, unspecified: Secondary | ICD-10-CM | POA: Diagnosis present

## 2021-01-13 DIAGNOSIS — I10 Essential (primary) hypertension: Secondary | ICD-10-CM | POA: Insufficient documentation

## 2021-01-13 DIAGNOSIS — Z85828 Personal history of other malignant neoplasm of skin: Secondary | ICD-10-CM | POA: Insufficient documentation

## 2021-01-13 LAB — COMPREHENSIVE METABOLIC PANEL
ALT: 14 U/L (ref 0–44)
AST: 17 U/L (ref 15–41)
Albumin: 4.5 g/dL (ref 3.5–5.0)
Alkaline Phosphatase: 75 U/L (ref 38–126)
Anion gap: 13 (ref 5–15)
BUN: 29 mg/dL — ABNORMAL HIGH (ref 8–23)
CO2: 21 mmol/L — ABNORMAL LOW (ref 22–32)
Calcium: 9.7 mg/dL (ref 8.9–10.3)
Chloride: 101 mmol/L (ref 98–111)
Creatinine, Ser: 0.85 mg/dL (ref 0.61–1.24)
GFR, Estimated: >60 mL/min (ref >60)
Glucose, Bld: 118 mg/dL — ABNORMAL HIGH (ref 70–99)
Potassium: 3.9 mmol/L (ref 3.5–5.1)
Sodium: 135 mmol/L (ref 135–145)
Total Bilirubin: 3.2 mg/dL — ABNORMAL HIGH (ref 0.3–1.2)
Total Protein: 7.6 g/dL (ref 6.5–8.1)

## 2021-01-13 LAB — CBC WITH DIFFERENTIAL/PLATELET
Abs Immature Granulocytes: 0.03 10*3/uL (ref 0.00–0.07)
Basophils Absolute: 0 10*3/uL (ref 0.0–0.1)
Basophils Relative: 0 %
Eosinophils Absolute: 0 10*3/uL (ref 0.0–0.5)
Eosinophils Relative: 0 %
HCT: 46 % (ref 39.0–52.0)
Hemoglobin: 16.2 g/dL (ref 13.0–17.0)
Immature Granulocytes: 0 %
Lymphocytes Relative: 11 %
Lymphs Abs: 1.1 10*3/uL (ref 0.7–4.0)
MCH: 31.4 pg (ref 26.0–34.0)
MCHC: 35.2 g/dL (ref 30.0–36.0)
MCV: 89.1 fL (ref 80.0–100.0)
Monocytes Absolute: 0.9 10*3/uL (ref 0.1–1.0)
Monocytes Relative: 9 %
Neutro Abs: 7.7 10*3/uL (ref 1.7–7.7)
Neutrophils Relative %: 80 %
Platelets: 369 10*3/uL (ref 150–400)
RBC: 5.16 MIL/uL (ref 4.22–5.81)
RDW: 11.9 % (ref 11.5–15.5)
WBC: 9.7 10*3/uL (ref 4.0–10.5)
nRBC: 0 % (ref 0.0–0.2)

## 2021-01-13 LAB — LIPASE, BLOOD: Lipase: 34 U/L (ref 11–51)

## 2021-01-13 LAB — MAGNESIUM: Magnesium: 2.2 mg/dL (ref 1.7–2.4)

## 2021-01-13 MED ORDER — DROPERIDOL 2.5 MG/ML IJ SOLN
2.5000 mg | Freq: Once | INTRAMUSCULAR | Status: AC
  Administered 2021-01-13: 2.5 mg via INTRAVENOUS
  Filled 2021-01-13: qty 2

## 2021-01-13 MED ORDER — LACTATED RINGERS IV BOLUS
1000.0000 mL | Freq: Once | INTRAVENOUS | Status: AC
  Administered 2021-01-13: 1000 mL via INTRAVENOUS

## 2021-01-13 MED ORDER — ONDANSETRON 4 MG PO TBDP
4.0000 mg | ORAL_TABLET | Freq: Once | ORAL | Status: AC
  Administered 2021-01-13: 4 mg via ORAL
  Filled 2021-01-13: qty 1

## 2021-01-13 MED ORDER — ONDANSETRON 4 MG PO TBDP: 4.0000 mg | ORAL_TABLET | Freq: Three times a day (TID) | ORAL | 0 refills | Status: DC | PRN

## 2021-01-13 MED ORDER — IOHEXOL 300 MG/ML  SOLN
75.0000 mL | Freq: Once | INTRAMUSCULAR | Status: AC | PRN
  Administered 2021-01-13: 75 mL via INTRAVENOUS

## 2021-01-13 NOTE — ED Provider Notes (Signed)
[facility] Emergency Department Provider Note   ____________________________________________   Event Date/Time   First MD Initiated Contact with Patient 01/13/21 1641     (approximate)  I have reviewed the triage vital signs and the nursing notes.   HISTORY  Chief Complaint Nausea    HPI Johnathan Harvey is a 65 y.o. male with past medical history of hypertension, GERD, and esophageal stricture who presents to the ED complaining of nausea and vomiting.  Patient reports that he has been consistently vomiting for the past 3 days, has been unable to keep down either liquids or solids.  This is been associated with diarrhea but he denies any fevers or abdominal pain.  He had similar symptoms a couple of weeks ago, was seen in the ED at that time with reassuring work-up and was discharged home.  He reports being told he has an esophageal stricture in the past on endoscopy and he has had significant issues with dysphagia and vomiting in the past.  He required balloon dilation of his esophagus in November of last year.        Past Medical History:  Diagnosis Date  . Arthritis   . Esophageal stricture   . GERD (gastroesophageal reflux disease)   . History of MRSA infection 2016   knee wound  . Hypertension    was put on medications in [place], stopped shortly after, no longer needs meds  . Skin cancer, basal cell     Patient Active Problem List   Diagnosis Date Noted  . Cellulitis and abscess of leg 07/28/2015  . HTN (hypertension) 07/28/2015  . GERD (gastroesophageal reflux disease) 07/28/2015  . Arthritis 07/28/2015  . Opiate overdose ([place]) 05/02/2015    Past Surgical History:  Procedure Laterality Date  . ANKLE ARTHROPLASTY    . ESOPHAGOGASTRODUODENOSCOPY N/A 11/04/2018   Procedure: ESOPHAGOGASTRODUODENOSCOPY (EGD);  Surgeon: Lin Landsman, MD;  Location: [facility] ENDOSCOPY;  Service: Gastroenterology;  Laterality: N/A;  . INGUINAL  HERNIA REPAIR Left 09/26/2019   Procedure: LEFT INGUINAL HERNIA REPAIR;  Surgeon: Johnathan Hausen, MD;  Location: [place];  Service: General;  Laterality: Left;    Prior to Admission medications   Medication Sig Start Date End Date Taking? Authorizing Provider  ondansetron (ZOFRAN ODT) 4 MG disintegrating tablet Take 1 tablet (4 mg total) by mouth every 8 (eight) hours as needed for nausea or vomiting. 01/13/21  Yes Blake Divine, MD  albuterol (VENTOLIN HFA) 108 (90 Base) MCG/ACT inhaler Inhale 2 puffs into the lungs every 6 (six) hours as needed for wheezing or shortness of breath.    [provider]  cyclobenzaprine (FLEXERIL) 10 MG tablet Take 1 tablet (10 mg total) by mouth 3 (three) times daily as needed for muscle spasms. 12/07/20   Jearld Fenton, NP  gabapentin (NEURONTIN) 100 MG capsule Take 100 mg by mouth 3 (three) times daily.    [provider]  ibuprofen (ADVIL) 800 MG tablet Take 1 tablet (800 mg total) by mouth every 8 (eight) hours as needed. 02/04/20   Menshew, Dannielle Karvonen, PA-C  naproxen (NAPROSYN) 375 MG tablet Take 1 tablet (375 mg total) by mouth 2 (two) times daily with a meal. 12/07/20   Baity, Coralie Keens, NP  omeprazole (PRILOSEC) 40 MG capsule Take 1 capsule (40 mg total) by mouth 2 (two) times daily before a meal for 30 days. 11/05/18 09/19/19  Lin Landsman, MD  traMADol (ULTRAM) 50 MG tablet Take 1 tablet (  50 mg total) by mouth every 8 (eight) hours as needed. 12/07/20 12/07/21  Jearld Fenton, NP    Allergies Patient has no known allergies.  Family History  Problem Relation Age of Onset  . Cancer Mother   . Cancer Father     Social History Social History   Tobacco Use  . Smoking status: Former Smoker    Packs/day: 0.00  . Smokeless tobacco: Never Used  Substance Use Topics  . Alcohol use: No    Alcohol/week: 0.0 standard drinks  . Drug use: Not Currently    Types: Marijuana    Comment: pt denies use of drugs  09/19/19    Review of Systems  Constitutional: No fever/chills Eyes: No visual changes. ENT: No sore throat. Cardiovascular: Denies chest pain. Respiratory: Denies shortness of breath. Gastrointestinal: No abdominal pain.  Positive for nausea, vomiting, and diarrhea.  No constipation. Genitourinary: Negative for dysuria. Musculoskeletal: Negative for back pain. Skin: Negative for rash. Neurological: Negative for headaches, focal weakness or numbness.  ____________________________________________   PHYSICAL EXAM:  VITAL SIGNS: ED Triage Vitals  Enc Vitals Group     BP 01/13/21 1639 (!) 144/111     Pulse Rate 01/13/21 1639 95     Resp 01/13/21 1639 20     Temp 01/13/21 1641 98.6 F (37 C)     Temp Source 01/13/21 1641 Oral     SpO2 01/13/21 1639 97 %     Weight 01/13/21 1641 130 lb (59 kg)     Height 01/13/21 1641 5\' 10"  (1.778 m)     Head Circumference --      Peak Flow --      Pain Score 01/13/21 1641 0     Pain Loc --      Pain Edu? --      Excl. in [place]? --     Constitutional: Alert and oriented. Eyes: Conjunctivae are normal. Head: Atraumatic. Nose: No congestion/rhinnorhea. Mouth/Throat: Mucous membranes are moist. Neck: Normal ROM Cardiovascular: Tachycardic, regular rhythm. Grossly normal heart sounds. Respiratory: Normal respiratory effort.  No retractions. Lungs CTAB. Gastrointestinal: Soft and tender to palpation in the left lower quadrant with no rebound or guarding. No distention. Genitourinary: deferred Musculoskeletal: No lower extremity tenderness nor edema. Neurologic:  Normal speech and language. No gross focal neurologic deficits are appreciated. Skin:  Skin is warm, dry and intact. No rash noted. Psychiatric: Mood and affect are normal. Speech and behavior are normal.  ____________________________________________   LABS (all labs ordered are listed, but only abnormal results are displayed)  Labs Reviewed  COMPREHENSIVE METABOLIC PANEL -  Abnormal; Notable for the following components:      Result Value   CO2 21 (*)    Glucose, Bld 118 (*)    BUN 29 (*)    Total Bilirubin 3.2 (*)    All other components within normal limits  LIPASE, BLOOD  MAGNESIUM  CBC WITH DIFFERENTIAL/PLATELET  URINALYSIS, COMPLETE (UACMP) WITH MICROSCOPIC    PROCEDURES  Procedure(s) performed (including Critical Care):  Procedures   ____________________________________________   INITIAL IMPRESSION / ASSESSMENT AND PLAN / ED COURSE       65 year old male with past medical history of hypertension, GERD, and esophageal stricture who presents to the ED complaining of persistent nausea, vomiting, and diarrhea for the past 3 days.  Patient is actively vomiting and retching on arrival, also has tenderness to palpation left lower quadrant of his abdomen.  We will further assess for diverticulitis or other acute process with  CT scan, check labs including LFTs and lipase.  Symptoms may also be due to his known esophageal stricture.  Patient with difficult IV access and we will treat with ODT Zofran while awaiting IV team consult.  IV obtained and patient given dose of droperidol, hydrate with IV fluids.  Lab work is unremarkable, LFTs and lipase within normal limits.  CT scan appears consistent with ileus, no evidence of diverticulitis or other infectious process.  Patient feeling much better following droperidol and is now tolerating p.o. without difficulty.  He is requesting to be discharged home, was counseled to follow-up with GI as well as his PCP.  He was counseled to return to the ED for new worsening symptoms, patient agrees with plan.      ____________________________________________   FINAL CLINICAL IMPRESSION(S) / ED DIAGNOSES  Final diagnoses:  Non-intractable vomiting with nausea, unspecified vomiting type  [facility]     ED Discharge Orders         Ordered    ondansetron (ZOFRAN ODT) 4 MG disintegrating tablet  Every 8 hours  PRN        01/13/21 2011           Note:  This document was prepared using Dragon voice recognition software and may include unintentional dictation errors.   Blake Divine, MD 01/13/21 2012

## 2021-01-13 NOTE — ED Notes (Addendum)
Pt presents to ED with c/o of N/V/D since Tuesday. Pt states at this time he had 1 drink and chicken at [employer] on Tuesday and states has been sick since. Pt states he also feels "like acid in his throat". Pt is dry heaving at this time. Pt denies chest pain or SOB. NAD noted. Pt also states intermittent dizziness and weakness and states "I am dehydrated". Pt is A&Ox4.

## 2021-01-13 NOTE — ED Triage Notes (Signed)
See first nurse note- Pt here for NV x2 days and GERD symptoms.

## 2021-01-13 NOTE — ED Triage Notes (Signed)
Pt comes into the ED via [facility] from home c/o N/V x 2 days and GERD.  Pt has not been taking any medication.  Pt also c/o dizzy and weak.  Pt a&Ox4, VSS with EMS, CBG 166, afebrile. 4mg  zofran given and 20g IV in right FA.

## 2021-01-13 NOTE — ED Notes (Signed)
Pt given crackers and coke.

## 2021-03-11 ENCOUNTER — Emergency Department: Payer: Medicare Other

## 2021-03-11 ENCOUNTER — Other Ambulatory Visit: Payer: Self-pay

## 2021-03-11 ENCOUNTER — Emergency Department
Admission: EM | Admit: 2021-03-11 | Discharge: 2021-03-11 | Disposition: A | Discharge status: Home / Self Care | Payer: Medicare Other | Attending: Emergency Medicine | Admitting: Emergency Medicine

## 2021-03-11 DIAGNOSIS — R197 Diarrhea, unspecified: Secondary | ICD-10-CM | POA: Insufficient documentation

## 2021-03-11 DIAGNOSIS — R112 Nausea with vomiting, unspecified: Secondary | ICD-10-CM | POA: Diagnosis present

## 2021-03-11 DIAGNOSIS — R079 Chest pain, unspecified: Secondary | ICD-10-CM | POA: Insufficient documentation

## 2021-03-11 DIAGNOSIS — Z20822 Contact with and (suspected) exposure to covid-19: Secondary | ICD-10-CM | POA: Insufficient documentation

## 2021-03-11 DIAGNOSIS — R109 Unspecified abdominal pain: Secondary | ICD-10-CM

## 2021-03-11 DIAGNOSIS — E860 Dehydration: Secondary | ICD-10-CM | POA: Diagnosis not present

## 2021-03-11 DIAGNOSIS — D72829 Elevated white blood cell count, unspecified: Secondary | ICD-10-CM | POA: Insufficient documentation

## 2021-03-11 DIAGNOSIS — M791 Myalgia, unspecified site: Secondary | ICD-10-CM | POA: Diagnosis not present

## 2021-03-11 DIAGNOSIS — I10 Essential (primary) hypertension: Secondary | ICD-10-CM | POA: Diagnosis not present

## 2021-03-11 DIAGNOSIS — R1032 Left lower quadrant pain: Secondary | ICD-10-CM | POA: Insufficient documentation

## 2021-03-11 DIAGNOSIS — Z87891 Personal history of nicotine dependence: Secondary | ICD-10-CM | POA: Diagnosis not present

## 2021-03-11 LAB — CBC
HCT: 43.8 % (ref 39.0–52.0)
Hemoglobin: 15.7 g/dL (ref 13.0–17.0)
MCH: 30.7 pg (ref 26.0–34.0)
MCHC: 35.8 g/dL (ref 30.0–36.0)
MCV: 85.5 fL (ref 80.0–100.0)
Platelets: 338 10*3/uL (ref 150–400)
RBC: 5.12 MIL/uL (ref 4.22–5.81)
RDW: 12.1 % (ref 11.5–15.5)
WBC: 12.5 10*3/uL — ABNORMAL HIGH (ref 4.0–10.5)
nRBC: 0 % (ref 0.0–0.2)

## 2021-03-11 LAB — HEPATIC FUNCTION PANEL
ALT: 15 U/L (ref 0–44)
AST: 26 U/L (ref 15–41)
Albumin: 4.5 g/dL (ref 3.5–5.0)
Alkaline Phosphatase: 64 U/L (ref 38–126)
Bilirubin, Direct: 0.4 mg/dL — ABNORMAL HIGH (ref 0.0–0.2)
Indirect Bilirubin: 2.6 mg/dL — ABNORMAL HIGH (ref 0.3–0.9)
Total Bilirubin: 3 mg/dL — ABNORMAL HIGH (ref 0.3–1.2)
Total Protein: 7.7 g/dL (ref 6.5–8.1)

## 2021-03-11 LAB — BASIC METABOLIC PANEL
Anion gap: 13 (ref 5–15)
BUN: 18 mg/dL (ref 8–23)
CO2: 23 mmol/L (ref 22–32)
Calcium: 9.3 mg/dL (ref 8.9–10.3)
Chloride: 94 mmol/L — ABNORMAL LOW (ref 98–111)
Creatinine, Ser: 0.82 mg/dL (ref 0.61–1.24)
GFR, Estimated: >60 mL/min (ref >60)
Glucose, Bld: 132 mg/dL — ABNORMAL HIGH (ref 70–99)
Potassium: 3.5 mmol/L (ref 3.5–5.1)
Sodium: 130 mmol/L — ABNORMAL LOW (ref 135–145)

## 2021-03-11 LAB — TROPONIN I (HIGH SENSITIVITY): Troponin I (High Sensitivity): 5 ng/L (ref <18)

## 2021-03-11 LAB — LIPASE, BLOOD: Lipase: 32 U/L (ref 11–51)

## 2021-03-11 LAB — RESP PANEL BY RT-PCR (FLU A&B, COVID) ARPGX2
Influenza A by PCR: NEGATIVE
Influenza B by PCR: NEGATIVE
SARS Coronavirus 2 by RT PCR: NEGATIVE

## 2021-03-11 LAB — MAGNESIUM: Magnesium: 2.1 mg/dL (ref 1.7–2.4)

## 2021-03-11 MED ORDER — ONDANSETRON HCL 4 MG/2ML IJ SOLN
4.0000 mg | Freq: Once | INTRAMUSCULAR | Status: AC
  Administered 2021-03-11: 4 mg via INTRAVENOUS
  Filled 2021-03-11: qty 2

## 2021-03-11 MED ORDER — MORPHINE SULFATE (PF) 4 MG/ML IV SOLN
4.0000 mg | Freq: Once | INTRAVENOUS | Status: AC
Start: 2021-03-11 — End: 2021-03-11
  Administered 2021-03-11: 4 mg via INTRAVENOUS
  Filled 2021-03-11: qty 1

## 2021-03-11 MED ORDER — DIPHENHYDRAMINE HCL 50 MG/ML IJ SOLN
25.0000 mg | Freq: Once | INTRAMUSCULAR | Status: AC
  Administered 2021-03-11: 25 mg via INTRAVENOUS
  Filled 2021-03-11: qty 1

## 2021-03-11 MED ORDER — DICYCLOMINE HCL 10 MG PO CAPS
10.0000 mg | ORAL_CAPSULE | Freq: Once | ORAL | Status: AC
  Administered 2021-03-11: 10 mg via ORAL
  Filled 2021-03-11: qty 1

## 2021-03-11 MED ORDER — METOCLOPRAMIDE HCL 10 MG PO TABS: 10.0000 mg | ORAL_TABLET | Freq: Three times a day (TID) | ORAL | 0 refills | Status: DC

## 2021-03-11 MED ORDER — ALUM & MAG HYDROXIDE-SIMETH 200-200-20 MG/5ML PO SUSP
30.0000 mL | Freq: Once | ORAL | Status: AC
  Administered 2021-03-11: 30 mL via ORAL
  Filled 2021-03-11: qty 30

## 2021-03-11 MED ORDER — HYDROMORPHONE HCL 1 MG/ML IJ SOLN
0.5000 mg | Freq: Once | INTRAMUSCULAR | Status: AC
Start: 2021-03-11 — End: 2021-03-11
  Administered 2021-03-11: 0.5 mg via INTRAVENOUS
  Filled 2021-03-11: qty 1

## 2021-03-11 MED ORDER — ONDANSETRON 4 MG PO TBDP
4.0000 mg | ORAL_TABLET | Freq: Once | ORAL | Status: AC
  Administered 2021-03-11: 4 mg via ORAL
  Filled 2021-03-11: qty 1

## 2021-03-11 MED ORDER — LACTATED RINGERS IV BOLUS
1000.0000 mL | Freq: Once | INTRAVENOUS | Status: AC
  Administered 2021-03-11: 1000 mL via INTRAVENOUS

## 2021-03-11 MED ORDER — PANTOPRAZOLE SODIUM 40 MG IV SOLR
40.0000 mg | Freq: Once | INTRAVENOUS | Status: AC
  Administered 2021-03-11: 40 mg via INTRAVENOUS
  Filled 2021-03-11: qty 40

## 2021-03-11 NOTE — ED Triage Notes (Addendum)
Pt comes into the ED via EMS from home with c/o abd pain for the past 3 days with N/V. Also c/o back pain  154/110 88 100%RA   when asked where his abd hurts the pt points to his central chest, pt states he started getting sick right after he ate some chicken.. pt also c/o having diarrhea

## 2021-03-11 NOTE — Discharge Instructions (Addendum)
Your blood pressure was noted to be elevated to the emergency room.  Please have this followed up into the days by your primary care doctor.

## 2021-03-11 NOTE — ED Provider Notes (Addendum)
[facility] Emergency Department Provider Note  ____________________________________________   Event Date/Time   First MD Initiated Contact with Patient 03/11/21 0818     (approximate)  I have reviewed the triage vital signs and the nursing notes.   HISTORY  Chief Complaint Abdominal Pain   HPI Johnathan Harvey is a 65 y.o. male with a past medical history of arthritis, esophageal strictures typically dilated every 3 months by GI at [facility], GERD, and HTN who presents for assessment approximately 3 days of nonbloody nonbilious vomiting, diarrhea, nausea, some burning in the chest radiating to the bilateral shoulders and some crampy abdominal pain more in his left lower quadrant and in the right or upper quadrants.  No lower back pain.  He denies any urinary symptoms or rash.  No fevers, recent injuries, focal extremity pain but he does endorse some myalgias.  He denies any illicit drug use, incontinence, numbness, recent steroid use or EtOH use         Past Medical History:  Diagnosis Date  . Arthritis   . Esophageal stricture   . GERD (gastroesophageal reflux disease)   . History of MRSA infection 2016   knee wound  . Hypertension    was put on medications in [place], stopped shortly after, no longer needs meds  . Skin cancer, basal cell     Patient Active Problem List   Diagnosis Date Noted  . Cellulitis and abscess of leg 07/28/2015  . HTN (hypertension) 07/28/2015  . GERD (gastroesophageal reflux disease) 07/28/2015  . Arthritis 07/28/2015  . Opiate overdose ([place]) 05/02/2015    Past Surgical History:  Procedure Laterality Date  . ANKLE ARTHROPLASTY    . ESOPHAGOGASTRODUODENOSCOPY N/A 11/04/2018   Procedure: ESOPHAGOGASTRODUODENOSCOPY (EGD);  Surgeon: Lin Landsman, MD;  Location: [facility] ENDOSCOPY;  Service: Gastroenterology;  Laterality: N/A;  . INGUINAL HERNIA REPAIR Left 09/26/2019   Procedure: LEFT INGUINAL HERNIA REPAIR;   Surgeon: Johnathan Hausen, MD;  Location: [place];  Service: General;  Laterality: Left;    Prior to Admission medications   Medication Sig Start Date End Date Taking? Authorizing Provider  metoCLOPramide (REGLAN) 10 MG tablet Take 1 tablet (10 mg total) by mouth 3 (three) times daily with meals for 3 days. 03/11/21 03/14/21 Yes Lucrezia Starch, MD  albuterol (VENTOLIN HFA) 108 (90 Base) MCG/ACT inhaler Inhale 2 puffs into the lungs every 6 (six) hours as needed for wheezing or shortness of breath.    [provider]  cyclobenzaprine (FLEXERIL) 10 MG tablet Take 1 tablet (10 mg total) by mouth 3 (three) times daily as needed for muscle spasms. 12/07/20   Jearld Fenton, NP  gabapentin (NEURONTIN) 100 MG capsule Take 100 mg by mouth 3 (three) times daily.    [provider]  ibuprofen (ADVIL) 800 MG tablet Take 1 tablet (800 mg total) by mouth every 8 (eight) hours as needed. 02/04/20   Menshew, Dannielle Karvonen, PA-C  naproxen (NAPROSYN) 375 MG tablet Take 1 tablet (375 mg total) by mouth 2 (two) times daily with a meal. 12/07/20   Baity, Coralie Keens, NP  omeprazole (PRILOSEC) 40 MG capsule Take 1 capsule (40 mg total) by mouth 2 (two) times daily before a meal for 30 days. 11/05/18 09/19/19  Lin Landsman, MD  ondansetron (ZOFRAN ODT) 4 MG disintegrating tablet Take 1 tablet (4 mg total) by mouth every 8 (eight) hours as needed for nausea or vomiting. 01/13/21   Blake Divine, MD  traMADol (ULTRAM) 50 MG tablet Take 1 tablet (50 mg total) by mouth every 8 (eight) hours as needed. 12/07/20 12/07/21  Jearld Fenton, NP    Allergies Protonix [pantoprazole]  Family History  Problem Relation Age of Onset  . Cancer Mother   . Cancer Father     Social History Social History   Tobacco Use  . Smoking status: Former Smoker    Packs/day: 0.00  . Smokeless tobacco: Never Used  Substance Use Topics  . Alcohol use: No    Alcohol/week: 0.0 standard drinks  . Drug  use: Not Currently    Types: Marijuana    Comment: pt denies use of drugs 09/19/19    Review of Systems  Review of Systems  Constitutional: Negative for chills and fever.  HENT: Negative for sore throat.   Eyes: Negative for pain.  Respiratory: Positive for shortness of breath. Negative for cough and stridor.   Cardiovascular: Positive for chest pain.  Gastrointestinal: Positive for abdominal pain, nausea and vomiting.  Genitourinary: Negative for dysuria.  Musculoskeletal: Positive for myalgias.  Skin: Negative for rash.  Neurological: Negative for seizures, loss of consciousness and headaches.  Psychiatric/Behavioral: Negative for suicidal ideas.  All other systems reviewed and are negative.     ____________________________________________   PHYSICAL EXAM:  VITAL SIGNS: ED Triage Vitals  Enc Vitals Group     BP 03/11/21 0806 (!) 150/103     Pulse Rate 03/11/21 0806 92     Resp 03/11/21 0806 20     Temp 03/11/21 0806 98.2 F (36.8 C)     Temp Source 03/11/21 0806 Oral     SpO2 03/11/21 0806 100 %     Weight 03/11/21 0807 130 lb (59 kg)     Height 03/11/21 0807 5\' 10"  (1.778 m)     Head Circumference --      Peak Flow --      Pain Score 03/11/21 0806 8     Pain Loc --      Pain Edu? --      Excl. in [place]? --    Vitals:   03/11/21 0930 03/11/21 1040  BP: (!) 161/95 (!) 185/102  Pulse: 65 (!) 59  Resp: 15 16  Temp:    SpO2: 95% 100%   Physical Exam Vitals and nursing note reviewed.  Constitutional:      Appearance: He is well-developed.  HENT:     Head: Normocephalic and atraumatic.     Right Ear: External ear normal.     Left Ear: External ear normal.     Nose: Nose normal.     Mouth/Throat:     Mouth: Mucous membranes are dry.  Eyes:     Conjunctiva/sclera: Conjunctivae normal.  Cardiovascular:     Rate and Rhythm: Normal rate and regular rhythm.     Pulses: Normal pulses.     Heart sounds: No murmur heard.   Pulmonary:     Effort: Pulmonary  effort is normal. No respiratory distress.     Breath sounds: Normal breath sounds.  Abdominal:     Palpations: Abdomen is soft.     Tenderness: There is abdominal tenderness. There is no right CVA tenderness or left CVA tenderness.  Musculoskeletal:     Cervical back: Neck supple.  Skin:    General: Skin is warm and dry.     Capillary Refill: Capillary refill takes 2 to 3 seconds.  Neurological:     General: No focal deficit present.  Mental Status: He is alert and oriented to person, place, and time.  Psychiatric:        Mood and Affect: Mood normal.     No umbilical or inguinal hernias.  No overlying skin changes of the patient's back step-offs or deformities over the C/T/L-spine. ____________________________________________   LABS (all labs ordered are listed, but only abnormal results are displayed)  Labs Reviewed  BASIC METABOLIC PANEL - Abnormal; Notable for the following components:      Result Value   Sodium 130 (*)    Chloride 94 (*)    Glucose, Bld 132 (*)    All other components within normal limits  CBC - Abnormal; Notable for the following components:   WBC 12.5 (*)    All other components within normal limits  HEPATIC FUNCTION PANEL - Abnormal; Notable for the following components:   Total Bilirubin 3.0 (*)    Bilirubin, Direct 0.4 (*)    Indirect Bilirubin 2.6 (*)    All other components within normal limits  RESP PANEL BY RT-PCR (FLU A&B, COVID) ARPGX2  LIPASE, BLOOD  MAGNESIUM  TROPONIN I (HIGH SENSITIVITY)   ____________________________________________  EKG  Sinus rhythm with a ventricular of 84, normal axis, unremarkable intervals and some nonspecific ST changes in anterior leads without evidence of acute ischemia or significant Arrhythmia ____________________________________________  RADIOLOGY  ED MD interpretation: CT abdomen pelvis with hepatic cyst and hemangiomas noted on prior CTs and aortic atherosclerosis as well as a left  nonobstructing kidney stone without any ureteral or other evidence of other stone or pyelonephritis.  No other clear acute abdominopelvic pathology.  Chest x-ray is unremarkable.  Official radiology report(s): CT ABDOMEN PELVIS WO CONTRAST  Result Date: 03/11/2021 CLINICAL DATA:  Acute generalized abdominal pain. EXAM: CT ABDOMEN AND PELVIS WITHOUT CONTRAST TECHNIQUE: Multidetector CT imaging of the abdomen and pelvis was performed following the standard protocol without IV contrast. COMPARISON:  January 13, 2021. FINDINGS: Lower chest: No acute abnormality. Hepatobiliary: No gallstones or biliary dilatation is noted. Stable low densities are noted throughout hepatic parenchyma consistent with hepatic cysts and hemangiomas as noted on prior exam. Pancreas: Unremarkable. No pancreatic ductal dilatation or surrounding inflammatory changes. Spleen: Normal in size without focal abnormality. Adrenals/Urinary Tract: Adrenal glands are unremarkable. Small nonobstructive left renal calculus is noted. No hydronephrosis or renal obstruction is noted. Bladder is unremarkable. Stomach/Bowel: Stomach is within normal limits. Appendix appears normal. No evidence of bowel wall thickening, distention, or inflammatory changes. Vascular/Lymphatic: Aortic atherosclerosis. No enlarged abdominal or pelvic lymph nodes. Reproductive: Stable mild prostatic enlargement is noted. Other: No abdominal wall hernia or abnormality. No abdominopelvic ascites. Musculoskeletal: No acute or significant osseous findings. IMPRESSION: Small nonobstructive left renal calculus. No hydronephrosis or renal obstruction is noted. Stable mild prostatic enlargement. Stable findings seen in the patent parenchyma consistent with cysts and hemangiomas as noted on prior exam. Aortic Atherosclerosis (ICD10-I70.0). Electronically Signed   By: Marijo Conception M.D.   On: 03/11/2021 09:00   DG Chest 2 View  Result Date: 03/11/2021 CLINICAL DATA:  Chest pain,  abdominal pain with nausea and vomiting EXAM: CHEST - 2 VIEW COMPARISON:  10/10/2019 FINDINGS: The heart size and mediastinal contours are within normal limits. Both lungs are clear. The visualized skeletal structures are unremarkable. Nonspecific gaseous distention of bowel in the epigastric region. IMPRESSION: No active cardiopulmonary disease. Electronically Signed   By: Jerilynn Mages.  Shick M.D.   On: 03/11/2021 09:22    ____________________________________________   PROCEDURES  Procedure(s) performed (including  Critical Care):  .1-3 Lead EKG Interpretation Performed by: Lucrezia Starch, MD Authorized by: Lucrezia Starch, MD     Interpretation: normal     ECG rate assessment: normal     Rhythm: sinus rhythm     Ectopy: none     Conduction: normal       ____________________________________________   INITIAL IMPRESSION / ASSESSMENT AND PLAN / ED COURSE      Patient presents with above-stated history exam for assessment of 3 days of nonbloody nonbilious vomiting, diarrhea, burning in the chest rating to the shoulders and some crampy abdominal pain more in the left lower quadrant anywhere else.  He also endorses some myalgias.  On arrival he is hypertensive with BP of 150/103 with otherwise stable vital signs on room air.  He has some mild tenderness throughout the abdomen which otherwise not peritoneal neck.  No hernias.  Lungs clear bilaterally.  Back is unremarkable.  Differential includes acute infectious gastroenteritis, ACS, pneumonia, cholecystitis, pancreatitis, diverticulitis kidney stone, and metabolic derangements  CT abdomen pelvis with hepatic cyst and hemangiomas noted on prior CTs and aortic atherosclerosis as well as a left nonobstructing kidney stone without any ureteral or other evidence of other stone or pyelonephritis.  No other clear acute abdominopelvic pathology.  Chest x-ray is unremarkable.  Given reassuring EKG with troponin of 5 obtained greater than 3 hours  after symptom onset of suspicion for ACS or myocarditis.  Chest x-ray has no evidence of pneumonia or other clear acute thoracic process including pneumomediastinum.  Suspect component of esophagitis from recent vomiting.  Lipase not consistent with acute pancreatitis.  No evidence of cholelithiasis, cholecystitis, appendicitis, pancreatitis, pyelonephritis or other clear acute abdominopelvic pathology on CT abdomen pelvis no exacerbation of pain.  Hepatic function panel has no evidence of cholestasis or transaminitis.  COVID and flu is negative.  BMP shows no significant electrolyte or metabolic derangements.  CBC shows mild leukocytosis with WBC count of 12.5 and otherwise unremarkable.  Suspect likely acute infectious gastroenteritis.  Patient given below noted antiemetics analgesia and GI cocktail and on reassessment was able to tolerate p.o.  He does have some erythema around site of Protonix injection was given some Benadryl which improved the erythema and itchiness experience.  This was added to his allergy list.  Given stable vitals with eyes reassuring exam work-up I think is safe for discharge with outpatient GI follow-up.  Discharged stable condition.  Strict return precautions advised and discussed.  Rx for Reglan written.  Advised that his blood pressure was quite elevated today and patient was rechecked by his PCP in the clinic.   ____________________________________________   FINAL CLINICAL IMPRESSION(S) / ED DIAGNOSES  Final diagnoses:  Nausea vomiting and diarrhea  Abdominal pain, unspecified abdominal location  Dehydration    Medications  ondansetron (ZOFRAN-ODT) disintegrating tablet 4 mg (4 mg Oral Given 03/11/21 0812)  lactated ringers bolus 1,000 mL (0 mLs Intravenous Stopped 03/11/21 1044)  HYDROmorphone (DILAUDID) injection 0.5 mg (0.5 mg Intravenous Given 03/11/21 0835)  ondansetron (ZOFRAN) injection 4 mg (4 mg Intravenous Given 03/11/21 0956)  dicyclomine (BENTYL) capsule  10 mg (10 mg Oral Given 03/11/21 1025)  alum & mag hydroxide-simeth (MAALOX/MYLANTA) 200-200-20 MG/5ML suspension 30 mL (30 mLs Oral Given 03/11/21 1025)  pantoprazole (PROTONIX) injection 40 mg (40 mg Intravenous Given 03/11/21 1025)  morphine 4 MG/ML injection 4 mg (4 mg Intravenous Given 03/11/21 1025)  diphenhydrAMINE (BENADRYL) injection 25 mg (25 mg Intravenous Given 03/11/21 1035)  ED Discharge Orders         Ordered    metoCLOPramide (REGLAN) 10 MG tablet  3 times daily with meals        03/11/21 1054           Note:  This document was prepared using Dragon voice recognition software and may include unintentional dictation errors.   Lucrezia Starch, MD 03/11/21 1057    Lucrezia Starch, MD 03/11/21 1059

## 2021-03-11 NOTE — ED Notes (Signed)
Pt had reaction to IV protonix, red and itchy; given 25mg  benadryl

## 2021-03-11 NOTE — ED Notes (Addendum)
Pt c/o lower back pain and LLQ abd pain for about a week, pt is N/V today, has hx of frequent endoscopy's.

## 2021-03-30 DIAGNOSIS — G44221 Chronic tension-type headache, intractable: Secondary | ICD-10-CM | POA: Insufficient documentation

## 2021-03-30 DIAGNOSIS — M7918 Myalgia, other site: Secondary | ICD-10-CM | POA: Insufficient documentation

## 2021-07-02 DIAGNOSIS — Q453 Other congenital malformations of pancreas and pancreatic duct: Secondary | ICD-10-CM | POA: Insufficient documentation

## 2021-07-02 DIAGNOSIS — R1110 Vomiting, unspecified: Secondary | ICD-10-CM | POA: Insufficient documentation

## 2021-09-12 ENCOUNTER — Other Ambulatory Visit: Payer: Self-pay | Admitting: Physician Assistant

## 2021-09-12 DIAGNOSIS — M542 Cervicalgia: Secondary | ICD-10-CM

## 2021-09-12 DIAGNOSIS — R519 Headache, unspecified: Secondary | ICD-10-CM

## 2021-09-17 ENCOUNTER — Other Ambulatory Visit: Payer: Self-pay

## 2021-09-17 ENCOUNTER — Emergency Department
Admission: EM | Admit: 2021-09-17 | Discharge: 2021-09-17 | Disposition: A | Discharge status: Home / Self Care | Payer: Medicare Other | Attending: Emergency Medicine | Admitting: Emergency Medicine

## 2021-09-17 ENCOUNTER — Ambulatory Visit
Admission: RE | Admit: 2021-09-17 | Discharge: 2021-09-17 | Disposition: A | Discharge status: Home / Self Care | Payer: Medicare Other | Source: Ambulatory Visit | Attending: Physician Assistant | Admitting: Physician Assistant

## 2021-09-17 DIAGNOSIS — G43809 Other migraine, not intractable, without status migrainosus: Secondary | ICD-10-CM | POA: Diagnosis not present

## 2021-09-17 DIAGNOSIS — I10 Essential (primary) hypertension: Secondary | ICD-10-CM | POA: Diagnosis not present

## 2021-09-17 DIAGNOSIS — Z87891 Personal history of nicotine dependence: Secondary | ICD-10-CM | POA: Insufficient documentation

## 2021-09-17 DIAGNOSIS — Z96669 Presence of unspecified artificial ankle joint: Secondary | ICD-10-CM | POA: Diagnosis not present

## 2021-09-17 DIAGNOSIS — M542 Cervicalgia: Secondary | ICD-10-CM

## 2021-09-17 DIAGNOSIS — R519 Headache, unspecified: Secondary | ICD-10-CM

## 2021-09-17 DIAGNOSIS — Z85828 Personal history of other malignant neoplasm of skin: Secondary | ICD-10-CM | POA: Insufficient documentation

## 2021-09-17 LAB — COMPREHENSIVE METABOLIC PANEL
ALT: 18 U/L (ref 0–44)
AST: 22 U/L (ref 15–41)
Albumin: 3.9 g/dL (ref 3.5–5.0)
Alkaline Phosphatase: 67 U/L (ref 38–126)
Anion gap: 7 (ref 5–15)
BUN: 18 mg/dL (ref 8–23)
CO2: 25 mmol/L (ref 22–32)
Calcium: 9.1 mg/dL (ref 8.9–10.3)
Chloride: 105 mmol/L (ref 98–111)
Creatinine, Ser: 0.61 mg/dL (ref 0.61–1.24)
GFR, Estimated: >60 mL/min (ref >60)
Glucose, Bld: 110 mg/dL — ABNORMAL HIGH (ref 70–99)
Potassium: 3.7 mmol/L (ref 3.5–5.1)
Sodium: 137 mmol/L (ref 135–145)
Total Bilirubin: 1.3 mg/dL — ABNORMAL HIGH (ref 0.3–1.2)
Total Protein: 6.6 g/dL (ref 6.5–8.1)

## 2021-09-17 LAB — CBC
HCT: 39.1 % (ref 39.0–52.0)
Hemoglobin: 13.3 g/dL (ref 13.0–17.0)
MCH: 31.3 pg (ref 26.0–34.0)
MCHC: 34 g/dL (ref 30.0–36.0)
MCV: 92 fL (ref 80.0–100.0)
Platelets: 390 10*3/uL (ref 150–400)
RBC: 4.25 MIL/uL (ref 4.22–5.81)
RDW: 13 % (ref 11.5–15.5)
WBC: 8.2 10*3/uL (ref 4.0–10.5)
nRBC: 0 % (ref 0.0–0.2)

## 2021-09-17 LAB — LIPASE, BLOOD: Lipase: 30 U/L (ref 11–51)

## 2021-09-17 MED ORDER — SODIUM CHLORIDE 0.9 % IV BOLUS
500.0000 mL | Freq: Once | INTRAVENOUS | Status: AC
  Administered 2021-09-17: 500 mL via INTRAVENOUS

## 2021-09-17 MED ORDER — DEXAMETHASONE SODIUM PHOSPHATE 10 MG/ML IJ SOLN
8.0000 mg | Freq: Once | INTRAMUSCULAR | Status: AC
  Administered 2021-09-17: 8 mg via INTRAVENOUS
  Filled 2021-09-17: qty 1

## 2021-09-17 MED ORDER — METOCLOPRAMIDE HCL 5 MG/ML IJ SOLN
20.0000 mg | Freq: Once | INTRAVENOUS | Status: AC
  Administered 2021-09-17: 20 mg via INTRAVENOUS
  Filled 2021-09-17: qty 4

## 2021-09-17 MED ORDER — DIPHENHYDRAMINE HCL 50 MG/ML IJ SOLN
25.0000 mg | Freq: Once | INTRAMUSCULAR | Status: AC
  Administered 2021-09-17: 25 mg via INTRAVENOUS
  Filled 2021-09-17: qty 1

## 2021-09-17 MED ORDER — KETOROLAC TROMETHAMINE 30 MG/ML IJ SOLN
30.0000 mg | Freq: Once | INTRAMUSCULAR | Status: AC
  Administered 2021-09-17: 30 mg via INTRAVENOUS
  Filled 2021-09-17: qty 1

## 2021-09-17 NOTE — ED Triage Notes (Signed)
Pt reports headache and nausea for the past year intermittently. Pt reports was supposed to get an MRI done today to figure out why but was too sick to get it done.

## 2021-09-17 NOTE — ED Provider Notes (Signed)
[facility] Emergency Department Provider Note   ____________________________________________    I have reviewed the triage vital signs and the nursing notes.   HISTORY  Chief Complaint Nausea and Headache     HPI Johnathan Harvey is a 65 y.o. male who presents with complaints of headache and nausea.  Patient describes global headache with nausea and vomiting.  Patient reports this is been ongoing for 2 to 3 days.  He reports he has had this numerous times in the past.  He is in fact seeing a neurologist for this.  Had an MRI scheduled today and was feeling too sick to have it obtained.  Describes global throbbing headache and nausea.  Past Medical History:  Diagnosis Date   Arthritis    Esophageal stricture    GERD (gastroesophageal reflux disease)    History of MRSA infection 2016   knee wound   Hypertension    was put on medications in [place], stopped shortly after, no longer needs meds   Skin cancer, basal cell     Patient Active Problem List   Diagnosis Date Noted   Cellulitis and abscess of leg 07/28/2015   HTN (hypertension) 07/28/2015   GERD (gastroesophageal reflux disease) 07/28/2015   Arthritis 07/28/2015   Opiate overdose ([place]) 05/02/2015    Past Surgical History:  Procedure Laterality Date   ANKLE ARTHROPLASTY     ESOPHAGOGASTRODUODENOSCOPY N/A 11/04/2018   Procedure: ESOPHAGOGASTRODUODENOSCOPY (EGD);  Surgeon: Lin Landsman, MD;  Location: [facility] ENDOSCOPY;  Service: Gastroenterology;  Laterality: N/A;   INGUINAL HERNIA REPAIR Left 09/26/2019   Procedure: LEFT INGUINAL HERNIA REPAIR;  Surgeon: Johnathan Hausen, MD;  Location: [place];  Service: General;  Laterality: Left;    Prior to Admission medications   Medication Sig Start Date End Date Taking? Authorizing Provider  albuterol (VENTOLIN HFA) 108 (90 Base) MCG/ACT inhaler Inhale 2 puffs into the lungs every 6 (six) hours as needed for wheezing  or shortness of breath.    [provider]  cyclobenzaprine (FLEXERIL) 10 MG tablet Take 1 tablet (10 mg total) by mouth 3 (three) times daily as needed for muscle spasms. 12/07/20   Jearld Fenton, NP  gabapentin (NEURONTIN) 100 MG capsule Take 100 mg by mouth 3 (three) times daily.    [provider]  ibuprofen (ADVIL) 800 MG tablet Take 1 tablet (800 mg total) by mouth every 8 (eight) hours as needed. 02/04/20   Menshew, Dannielle Karvonen, PA-C  metoCLOPramide (REGLAN) 10 MG tablet Take 1 tablet (10 mg total) by mouth 3 (three) times daily with meals for 3 days. 03/11/21 03/14/21  Lucrezia Starch, MD  naproxen (NAPROSYN) 375 MG tablet Take 1 tablet (375 mg total) by mouth 2 (two) times daily with a meal. 12/07/20   Baity, Coralie Keens, NP  omeprazole (PRILOSEC) 40 MG capsule Take 1 capsule (40 mg total) by mouth 2 (two) times daily before a meal for 30 days. 11/05/18 09/19/19  Lin Landsman, MD  ondansetron (ZOFRAN ODT) 4 MG disintegrating tablet Take 1 tablet (4 mg total) by mouth every 8 (eight) hours as needed for nausea or vomiting. 01/13/21   Blake Divine, MD  traMADol (ULTRAM) 50 MG tablet Take 1 tablet (50 mg total) by mouth every 8 (eight) hours as needed. 12/07/20 12/07/21  Jearld Fenton, NP     Allergies Protonix [pantoprazole]  Family History  Problem Relation Age of Onset   Cancer Mother  Cancer Father     Social History Social History   Tobacco Use   Smoking status: Former    Packs/day: 0.00    Types: Cigarettes   Smokeless tobacco: Never  Substance Use Topics   Alcohol use: No    Alcohol/week: 0.0 standard drinks   Drug use: Not Currently    Types: Marijuana    Comment: pt denies use of drugs 09/19/19    Review of Systems  Constitutional: No fever/chills Eyes: No visual changes.  ENT: No sore throat. Cardiovascular: Denies chest pain. Respiratory: Denies shortness of breath. Gastrointestinal: No abdominal pain.  Positive  nausea Genitourinary: Negative for dysuria. Musculoskeletal: Negative for back pain. Skin: Negative for rash. Neurological: As above, no neurodeficits   ____________________________________________   PHYSICAL EXAM:  VITAL SIGNS: ED Triage Vitals  Enc Vitals Group     BP 09/17/21 1656 (!) 180/97     Pulse Rate 09/17/21 1656 76     Resp 09/17/21 1656 20     Temp 09/17/21 1656 98.4 F (36.9 C)     Temp Source 09/17/21 1656 Oral     SpO2 09/17/21 1656 98 %     Weight 09/17/21 1655 59 kg (130 lb)     Height 09/17/21 1655 1.778 m (5\' 10" )     Head Circumference --      Peak Flow --      Pain Score 09/17/21 1654 10     Pain Loc --      Pain Edu? --      Excl. in [place]? --     Constitutional: Alert and oriented.  Eyes: Conjunctivae are normal.  PERRLA, EOMI Head: Atraumatic. Nose: No congestion/rhinnorhea. Mouth/Throat: Mucous membranes are moist.   Neck:  Painless ROM Cardiovascular: Normal rate, regular rhythm. K[place]it Balo peripheral circulation. Respiratory: Normal respiratory effort.  No retractions.   Musculoskeletal: No lower extremity tenderness nor edema.  Warm and well perfused Neurologic:  Normal speech and language. No gross focal neurologic deficits are appreciated.  Skin:  Skin is warm, dry and intact. No rash noted. Psychiatric: Mood and affect are normal. Speech and behavior are normal.  ____________________________________________   LABS (all labs ordered are listed, but only abnormal results are displayed)  Labs Reviewed  COMPREHENSIVE METABOLIC PANEL - Abnormal; Notable for the following components:      Result Value   Glucose, Bld 110 (*)    Total Bilirubin 1.3 (*)    All other components within normal limits  LIPASE, BLOOD  CBC   ____________________________________________  EKG  ED ECG REPORT I, Lavonia Drafts, the attending physician, personally viewed and interpreted this ECG.   Rhythm: normal sinus rhythm QRS Axis: normal Intervals:  normal ST/T Wave abnormalities: normal Narrative Interpretation: no evidence of acute ischemia  ____________________________________________  RADIOLOGY  None ____________________________________________   PROCEDURES  Procedure(s) performed: No  Procedures   Critical Care performed: No ____________________________________________   INITIAL IMPRESSION / ASSESSMENT AND PLAN / ED COURSE  Pertinent labs & imaging results that were available during my care of the patient were reviewed by me and considered in my medical decision making (see chart for details).   Patient presents with headache and nausea, strongly suspicious for migraine, he reports he has had migraine cocktails in the past but did not typically work  Will trial Decadron, Toradol, Reglan, Benadryl, IV fluids  Lab work is overall quite reassuring  Patient had significant improvement after treatment, is feeling much better, no further nausea.  Appropriate for discharge  at this time with continued follow-up with his neurologist.    ____________________________________________   FINAL CLINICAL IMPRESSION(S) / ED DIAGNOSES  Final diagnoses:  Other migraine without status migrainosus, not intractable        Note:  This document was prepared using Dragon voice recognition software and may include unintentional dictation errors.    Lavonia Drafts, MD 09/17/21 2342

## 2021-09-27 ENCOUNTER — Ambulatory Visit
Admission: RE | Admit: 2021-09-27 | Discharge: 2021-09-27 | Disposition: A | Discharge status: Home / Self Care | Payer: Medicare Other | Source: Ambulatory Visit | Attending: Physician Assistant | Admitting: Physician Assistant

## 2021-09-27 DIAGNOSIS — M542 Cervicalgia: Secondary | ICD-10-CM | POA: Diagnosis present

## 2021-09-27 DIAGNOSIS — R519 Headache, unspecified: Secondary | ICD-10-CM | POA: Diagnosis present

## 2021-09-30 ENCOUNTER — Emergency Department
Admission: EM | Admit: 2021-09-30 | Discharge: 2021-09-30 | Disposition: A | Discharge status: Home / Self Care | Payer: Medicare Other | Attending: Emergency Medicine | Admitting: Emergency Medicine

## 2021-09-30 ENCOUNTER — Encounter: Payer: Self-pay | Admitting: Emergency Medicine

## 2021-09-30 ENCOUNTER — Other Ambulatory Visit: Payer: Self-pay

## 2021-09-30 DIAGNOSIS — Z85828 Personal history of other malignant neoplasm of skin: Secondary | ICD-10-CM | POA: Insufficient documentation

## 2021-09-30 DIAGNOSIS — I10 Essential (primary) hypertension: Secondary | ICD-10-CM | POA: Diagnosis not present

## 2021-09-30 DIAGNOSIS — G43809 Other migraine, not intractable, without status migrainosus: Secondary | ICD-10-CM | POA: Insufficient documentation

## 2021-09-30 DIAGNOSIS — Z87891 Personal history of nicotine dependence: Secondary | ICD-10-CM | POA: Diagnosis not present

## 2021-09-30 DIAGNOSIS — R519 Headache, unspecified: Secondary | ICD-10-CM | POA: Diagnosis present

## 2021-09-30 MED ORDER — METOCLOPRAMIDE HCL 5 MG/ML IJ SOLN
10.0000 mg | Freq: Once | INTRAMUSCULAR | Status: AC
  Administered 2021-09-30: 10 mg via INTRAVENOUS
  Filled 2021-09-30: qty 2

## 2021-09-30 MED ORDER — ONDANSETRON HCL 4 MG/2ML IJ SOLN
4.0000 mg | Freq: Once | INTRAMUSCULAR | Status: AC
  Administered 2021-09-30: 4 mg via INTRAVENOUS
  Filled 2021-09-30: qty 2

## 2021-09-30 MED ORDER — DIPHENHYDRAMINE HCL 50 MG/ML IJ SOLN
25.0000 mg | Freq: Once | INTRAMUSCULAR | Status: AC
  Administered 2021-09-30: 25 mg via INTRAVENOUS
  Filled 2021-09-30: qty 1

## 2021-09-30 MED ORDER — DEXAMETHASONE SODIUM PHOSPHATE 10 MG/ML IJ SOLN
10.0000 mg | Freq: Once | INTRAMUSCULAR | Status: AC
  Administered 2021-09-30: 10 mg via INTRAVENOUS
  Filled 2021-09-30: qty 1

## 2021-09-30 MED ORDER — SODIUM CHLORIDE 0.9 % IV BOLUS
1000.0000 mL | Freq: Once | INTRAVENOUS | Status: AC
  Administered 2021-09-30: 1000 mL via INTRAVENOUS

## 2021-09-30 NOTE — ED Triage Notes (Signed)
Pt via POV from home. Pt c/o migraine for the past 3 days. States that he had an MRI done 2 days ago. Pt also endorses nausea and noise sensitivity. Pt takes gabapentin but states it does not help. Pt is A&Ox4 and NAD.

## 2021-09-30 NOTE — ED Provider Notes (Signed)
[facility] Emergency Department Provider    ____________________________________________   Event Date/Time   First MD Initiated Contact with Patient 09/30/21 1023     (approximate)  I have reviewed the triage vital signs and the nursing notes.   HISTORY  Chief Complaint Headache   HPI Johnathan Harvey is a 65 y.o. male presents to the ED from home with complaint of migraine for the last 3 days.  Patient has a history of migraines and states that he sees a neurologist for this.  Currently he endorses nausea and sensitivity to noise and light.  Patient reports that he took Excedrin 3 tablets this morning without any relief and routinely takes gabapentin which he states is not helping with his headaches.  He reports that this is his routine headache with no changes.  Patient also had an MRI done 2 days ago which was ordered by his neurologist.  Currently rates pain as a 10/10.       Past Medical History:  Diagnosis Date   Arthritis    Esophageal stricture    GERD (gastroesophageal reflux disease)    History of MRSA infection 2016   knee wound   Hypertension    was put on medications in [place], stopped shortly after, no longer needs meds   Skin cancer, basal cell     Patient Active Problem List   Diagnosis Date Noted   Cellulitis and abscess of leg 07/28/2015   HTN (hypertension) 07/28/2015   GERD (gastroesophageal reflux disease) 07/28/2015   Arthritis 07/28/2015   Opiate overdose ([place]) 05/02/2015    Past Surgical History:  Procedure Laterality Date   ANKLE ARTHROPLASTY     ESOPHAGOGASTRODUODENOSCOPY N/A 11/04/2018   Procedure: ESOPHAGOGASTRODUODENOSCOPY (EGD);  Surgeon: Lin Landsman, MD;  Location: [facility];  Service: Gastroenterology;  Laterality: N/A;   INGUINAL HERNIA REPAIR Left 09/26/2019   Procedure: LEFT INGUINAL HERNIA REPAIR;  Surgeon: Johnathan Hausen, MD;  Location: [place];  Service: General;   Laterality: Left;    Prior to Admission medications   Medication Sig Start Date End Date Taking? Authorizing Provider  albuterol (VENTOLIN HFA) 108 (90 Base) MCG/ACT inhaler Inhale 2 puffs into the lungs every 6 (six) hours as needed for wheezing or shortness of breath.    [provider]  cyclobenzaprine (FLEXERIL) 10 MG tablet Take 1 tablet (10 mg total) by mouth 3 (three) times daily as needed for muscle spasms. 12/07/20   Jearld Fenton, NP  gabapentin (NEURONTIN) 100 MG capsule Take 100 mg by mouth 3 (three) times daily.    [provider]  ibuprofen (ADVIL) 800 MG tablet Take 1 tablet (800 mg total) by mouth every 8 (eight) hours as needed. 02/04/20   Menshew, Dannielle Karvonen, PA-C  metoCLOPramide (REGLAN) 10 MG tablet Take 1 tablet (10 mg total) by mouth 3 (three) times daily with meals for 3 days. 03/11/21 03/14/21  Lucrezia Starch, MD  naproxen (NAPROSYN) 375 MG tablet Take 1 tablet (375 mg total) by mouth 2 (two) times daily with a meal. 12/07/20   Baity, Coralie Keens, NP  omeprazole (PRILOSEC) 40 MG capsule Take 1 capsule (40 mg total) by mouth 2 (two) times daily before a meal for 30 days. 11/05/18 09/19/19  Lin Landsman, MD  ondansetron (ZOFRAN ODT) 4 MG disintegrating tablet Take 1 tablet (4 mg total) by mouth every 8 (eight) hours as needed for nausea or vomiting. 01/13/21   Blake Divine, MD  traMADol (  ULTRAM) 50 MG tablet Take 1 tablet (50 mg total) by mouth every 8 (eight) hours as needed. 12/07/20 12/07/21  Jearld Fenton, NP    Allergies Protonix [pantoprazole]  Family History  Problem Relation Age of Onset   Cancer Mother    Cancer Father     Social History Social History   Tobacco Use   Smoking status: Former    Packs/day: 0.00    Types: Cigarettes   Smokeless tobacco: Never  Substance Use Topics   Alcohol use: No    Alcohol/week: 0.0 standard drinks   Drug use: Not Currently    Types: Marijuana    Comment: pt denies use of drugs 09/19/19     Review of Systems Constitutional: No fever/chills Eyes: Positive for photophobia.  Negative for visual changes. ENT: No sore throat. Cardiovascular: Denies chest pain. Respiratory: Denies shortness of breath.  Negative for cough. Gastrointestinal: No abdominal pain.  No nausea, no vomiting.  No diarrhea.   Musculoskeletal: Negative for muscle skeletal pain. Skin: Negative for rash. Neurological: Positive for history of migraine headaches.  Negative for focal weakness or numbness.  ____________________________________________   PHYSICAL EXAM:  VITAL SIGNS: ED Triage Vitals [09/30/21 1002]  Enc Vitals Group     BP (!) 161/98     Pulse Rate 70     Resp 20     Temp 98.3 F (36.8 C)     Temp Source Oral     SpO2 98 %     Weight 130 lb (59 kg)     Height 5\' 10"  (1.778 m)     Head Circumference      Peak Flow      Pain Score 10     Pain Loc      Pain Edu?      Excl. in [place]?     Constitutional: Alert and oriented. Well appearing and in no acute distress.  Patient is nauseous and trying to suppress vomiting. Eyes: Conjunctivae are normal. PERRL. EOMI. positive photosensitivity. Head: Atraumatic. Nose: No congestion/rhinnorhea. Mouth/Throat: Mucous membranes are moist.  Oropharynx non-erythematous. Neck: No stridor.   Cardiovascular: Normal rate, regular rhythm. Grossly normal heart sounds.  Good peripheral circulation. Respiratory: Normal respiratory effort.  No retractions. Lungs CTAB. Gastrointestinal: Soft and nontender. No distention.  Musculoskeletal: No lower extremity tenderness nor edema.  No joint effusions. Neurologic:  Normal speech and language. No gross focal neurologic deficits are appreciated. No gait instability. Skin:  Skin is warm, dry and intact. No rash noted. Psychiatric: Mood and affect are normal. Speech and behavior are normal.  ____________________________________________   LABS (all labs ordered are listed, but only abnormal results are  displayed)  Labs Reviewed - No data to display  PROCEDURES  Procedure(s) performed (including Critical Care):  Procedures   ____________________________________________   INITIAL IMPRESSION / ASSESSMENT AND PLAN / ED COURSE  As part of my medical decision making, I reviewed the following data within the electronic MEDICAL RECORD NUMBER Notes from prior ED visits and [place] Controlled Substance Database     ----------------------------------------- 12:20 PM on 09/30/2021 ----------------------------------------- Patient states that headache is completely gone.  65 year old male presents to the ED with complaint of onset of his normal migraine headache for last 3 days.  Patient states there is nothing out of the ordinary with this headache.  He also reports that he had an MRI done 2 days ago and this was reviewed by myself.  Patient was seen in the emergency department previously in which a  migraine cocktail was given which gave him complete relief of his headache.  This was again repeated today and once again he has gotten complete relief of his headache.  He has an appointment with his neurologist on Monday which she is encouraged to keep.  Patient was discharged completely headache free.   ____________________________________________   FINAL CLINICAL IMPRESSION(S) / ED DIAGNOSES  Final diagnoses:  Other migraine without status migrainosus, not intractable     ED Discharge Orders     None        Note:  This document was prepared using Dragon voice recognition software and may include unintentional dictation errors.    Johnn Hai, PA-C 09/30/21 1430    Duffy Bruce, MD 10/02/21 [phone]

## 2021-09-30 NOTE — ED Notes (Signed)
Pt to ED for migraine HA. Pt always has 5/10 HA and suffers frequent migraines. Has appt with neurologist this coming Monday. Had MRI a few days ago. Complains of 10/10 migraine HA in anterior head and also neck pain. 2 days ago, "vomited all day" but has not vomited yesterday or today. Denies dizziness, vision changes.

## 2021-09-30 NOTE — ED Notes (Signed)
Computer in room not working. Pt unable to sign for discharge because no computer. Pt gives verbal consent for discharge.

## 2021-09-30 NOTE — Discharge Instructions (Signed)
Keep your appointment with your neurologist on Monday.  Continue regular medications.

## 2021-10-18 DIAGNOSIS — M5030 Other cervical disc degeneration, unspecified cervical region: Secondary | ICD-10-CM | POA: Diagnosis not present

## 2021-10-18 DIAGNOSIS — M62838 Other muscle spasm: Secondary | ICD-10-CM | POA: Diagnosis not present

## 2021-10-18 DIAGNOSIS — M47812 Spondylosis without myelopathy or radiculopathy, cervical region: Secondary | ICD-10-CM | POA: Diagnosis not present

## 2021-10-25 DIAGNOSIS — M62838 Other muscle spasm: Secondary | ICD-10-CM | POA: Diagnosis not present

## 2021-10-25 DIAGNOSIS — M542 Cervicalgia: Secondary | ICD-10-CM | POA: Diagnosis not present

## 2021-10-25 DIAGNOSIS — R519 Headache, unspecified: Secondary | ICD-10-CM | POA: Diagnosis not present

## 2021-11-07 ENCOUNTER — Other Ambulatory Visit: Payer: Self-pay | Admitting: Physical Medicine and Rehabilitation

## 2021-11-07 DIAGNOSIS — M5030 Other cervical disc degeneration, unspecified cervical region: Secondary | ICD-10-CM | POA: Diagnosis not present

## 2021-11-07 DIAGNOSIS — M5412 Radiculopathy, cervical region: Secondary | ICD-10-CM | POA: Diagnosis not present

## 2021-11-07 DIAGNOSIS — M47812 Spondylosis without myelopathy or radiculopathy, cervical region: Secondary | ICD-10-CM | POA: Diagnosis not present

## 2021-11-07 DIAGNOSIS — M62838 Other muscle spasm: Secondary | ICD-10-CM | POA: Diagnosis not present

## 2021-11-09 ENCOUNTER — Ambulatory Visit
Admission: RE | Admit: 2021-11-09 | Discharge: 2021-11-09 | Disposition: A | Discharge status: Home / Self Care | Payer: No Typology Code available for payment source | Source: Ambulatory Visit | Attending: Physical Medicine and Rehabilitation | Admitting: Physical Medicine and Rehabilitation

## 2021-11-09 ENCOUNTER — Other Ambulatory Visit: Payer: Self-pay

## 2021-11-09 DIAGNOSIS — M5412 Radiculopathy, cervical region: Secondary | ICD-10-CM

## 2021-11-09 DIAGNOSIS — M542 Cervicalgia: Secondary | ICD-10-CM | POA: Diagnosis not present

## 2021-11-09 MED ORDER — IOPAMIDOL (ISOVUE-M 300) INJECTION 61%
1.0000 mL | Freq: Once | INTRAMUSCULAR | Status: AC
  Administered 2021-11-09: 1 mL via EPIDURAL

## 2021-11-09 MED ORDER — TRIAMCINOLONE ACETONIDE 40 MG/ML IJ SUSP (RADIOLOGY)
60.0000 mg | Freq: Once | INTRAMUSCULAR | Status: AC
  Administered 2021-11-09: 60 mg via EPIDURAL

## 2021-11-09 NOTE — Discharge Instructions (Signed)

## 2021-11-15 ENCOUNTER — Other Ambulatory Visit: Payer: Self-pay | Admitting: Physical Medicine and Rehabilitation

## 2021-11-15 DIAGNOSIS — M5412 Radiculopathy, cervical region: Secondary | ICD-10-CM

## 2021-11-24 DIAGNOSIS — Z125 Encounter for screening for malignant neoplasm of prostate: Secondary | ICD-10-CM | POA: Diagnosis not present

## 2021-11-24 DIAGNOSIS — Z0000 Encounter for general adult medical examination without abnormal findings: Secondary | ICD-10-CM | POA: Diagnosis not present

## 2021-11-24 DIAGNOSIS — R519 Headache, unspecified: Secondary | ICD-10-CM | POA: Diagnosis not present

## 2021-11-24 DIAGNOSIS — E538 Deficiency of other specified B group vitamins: Secondary | ICD-10-CM | POA: Diagnosis not present

## 2021-11-24 DIAGNOSIS — C44629 Squamous cell carcinoma of skin of left upper limb, including shoulder: Secondary | ICD-10-CM | POA: Diagnosis not present

## 2021-11-24 DIAGNOSIS — I10 Essential (primary) hypertension: Secondary | ICD-10-CM | POA: Diagnosis not present

## 2021-11-24 DIAGNOSIS — Z8619 Personal history of other infectious and parasitic diseases: Secondary | ICD-10-CM | POA: Insufficient documentation

## 2021-11-24 DIAGNOSIS — C44619 Basal cell carcinoma of skin of left upper limb, including shoulder: Secondary | ICD-10-CM | POA: Diagnosis not present

## 2021-11-24 DIAGNOSIS — R739 Hyperglycemia, unspecified: Secondary | ICD-10-CM | POA: Diagnosis not present

## 2021-11-24 DIAGNOSIS — C44311 Basal cell carcinoma of skin of nose: Secondary | ICD-10-CM | POA: Diagnosis not present

## 2021-11-24 DIAGNOSIS — M542 Cervicalgia: Secondary | ICD-10-CM | POA: Diagnosis not present

## 2021-11-24 DIAGNOSIS — E782 Mixed hyperlipidemia: Secondary | ICD-10-CM | POA: Diagnosis not present

## 2021-11-24 DIAGNOSIS — M5090 Cervical disc disorder, unspecified, unspecified cervical region: Secondary | ICD-10-CM | POA: Insufficient documentation

## 2021-11-25 ENCOUNTER — Other Ambulatory Visit: Payer: Self-pay

## 2021-11-25 ENCOUNTER — Encounter: Payer: Self-pay | Admitting: Plastic Surgery

## 2021-11-25 ENCOUNTER — Ambulatory Visit (INDEPENDENT_AMBULATORY_CARE_PROVIDER_SITE_OTHER): Payer: No Typology Code available for payment source | Admitting: Plastic Surgery

## 2021-11-25 DIAGNOSIS — L989 Disorder of the skin and subcutaneous tissue, unspecified: Secondary | ICD-10-CM | POA: Diagnosis not present

## 2021-11-25 NOTE — Addendum Note (Signed)
Addended by: Harl Bowie on: 11/25/2021 02:12 PM   Modules accepted: Orders

## 2021-11-25 NOTE — Progress Notes (Signed)
Patient ID: Johnathan Harvey, male    DOB: 18-Nov-1955, 66 y.o.   MRN: [id number]   Chief Complaint  Patient presents with   Consult        Skin Problem    The patient is a 66 year old male here for evaluation of his skin.  He has a 3 cm raised flesh-colored lesion on his left shoulder.  Its been draining and getting very irritated.  Its been there for several years but he is concerned because it is getting much larger and painful.  He has had multiple skin lesions removed in the past but not sure if they were cancer.  He also has a 5 mm lesion on the medial right lower eyelid.  This has a ulcerated central portion with raised borders.  This is very concerning for a basal cell carcinoma.  Its been there for the last several months and is getting larger quickly.  He has arthritis, reflux and a history of MRSA.  Past surgical history as listed below.   Review of Systems  Constitutional: Negative.   Eyes: Negative.   Respiratory: Negative.    Cardiovascular: Negative.  Negative for leg swelling.  Gastrointestinal: Negative.   Endocrine: Negative.   Genitourinary: Negative.   Musculoskeletal: Negative.   Skin:  Positive for wound. Negative for color change.  Neurological: Negative.   Hematological: Negative.    Past Medical History:  Diagnosis Date   Arthritis    Esophageal stricture    GERD (gastroesophageal reflux disease)    History of MRSA infection 2016   knee wound   Hypertension    was put on medications in [place], stopped shortly after, no longer needs meds   Skin cancer, basal cell     Past Surgical History:  Procedure Laterality Date   ANKLE ARTHROPLASTY     ESOPHAGOGASTRODUODENOSCOPY N/A 11/04/2018   Procedure: ESOPHAGOGASTRODUODENOSCOPY (EGD);  Surgeon: Lin Landsman, MD;  Location: [facility] ENDOSCOPY;  Service: Gastroenterology;  Laterality: N/A;   INGUINAL HERNIA REPAIR Left 09/26/2019   Procedure: LEFT INGUINAL HERNIA REPAIR;  Surgeon: Johnathan Hausen, MD;  Location: [place];  Service: General;  Laterality: Left;      Current Outpatient Medications:    albuterol (VENTOLIN HFA) 108 (90 Base) MCG/ACT inhaler, Inhale 2 puffs into the lungs every 6 (six) hours as needed for wheezing or shortness of breath., Disp: , Rfl:    cyclobenzaprine (FLEXERIL) 10 MG tablet, Take 1 tablet (10 mg total) by mouth 3 (three) times daily as needed for muscle spasms., Disp: 15 tablet, Rfl: 0   gabapentin (NEURONTIN) 100 MG capsule, Take 100 mg by mouth 3 (three) times daily., Disp: , Rfl:    ibuprofen (ADVIL) 800 MG tablet, Take 1 tablet (800 mg total) by mouth every 8 (eight) hours as needed., Disp: 30 tablet, Rfl: 0   metoCLOPramide (REGLAN) 10 MG tablet, Take 1 tablet (10 mg total) by mouth 3 (three) times daily with meals for 3 days., Disp: 9 tablet, Rfl: 0   naproxen (NAPROSYN) 375 MG tablet, Take 1 tablet (375 mg total) by mouth 2 (two) times daily with a meal., Disp: 10 tablet, Rfl: 0   omeprazole (PRILOSEC) 40 MG capsule, Take 1 capsule (40 mg total) by mouth 2 (two) times daily before a meal for 30 days., Disp: 60 capsule, Rfl: 3   ondansetron (ZOFRAN ODT) 4 MG disintegrating tablet, Take 1 tablet (4 mg total) by mouth every 8 (eight) hours as needed for  nausea or vomiting., Disp: 12 tablet, Rfl: 0   traMADol (ULTRAM) 50 MG tablet, Take 1 tablet (50 mg total) by mouth every 8 (eight) hours as needed., Disp: 20 tablet, Rfl: 0   Objective:   Vitals:   11/25/21 1218  BP: (!) 165/94  Pulse: 76  SpO2: 97%    Physical Exam Vitals reviewed.  Constitutional:      Appearance: Normal appearance.  HENT:     Head: Normocephalic and atraumatic.   Cardiovascular:     Rate and Rhythm: Normal rate.     Pulses: Normal pulses.  Pulmonary:     Effort: Pulmonary effort is normal.  Abdominal:     General: There is no distension.     Palpations: Abdomen is soft.  Musculoskeletal:        General: No swelling or deformity.        Arms:  Skin:    Capillary Refill: Capillary refill takes less than 2 seconds.     Coloration: Skin is not jaundiced.     Findings: Lesion present. No bruising.  Neurological:     Mental Status: He is alert and oriented to person, place, and time.  Psychiatric:        Mood and Affect: Mood normal.        Behavior: Behavior normal.        Thought Content: Thought content normal.        Judgment: Judgment normal.    Assessment & Plan:  Changing skin lesion  Recommend excision of left shoulder and right lower eyelid skin lesions.  We should be able to do this in the office.  Patient understands that if they come back positive for skin cancer he may need referral to Mohs surgery.  Pictures were obtained of the patient and placed in the chart with the patient's or guardian's permission.   [place], DO

## 2021-11-29 ENCOUNTER — Ambulatory Visit
Admission: RE | Admit: 2021-11-29 | Discharge: 2021-11-29 | Disposition: A | Discharge status: Home / Self Care | Payer: No Typology Code available for payment source | Source: Ambulatory Visit | Attending: Physical Medicine and Rehabilitation | Admitting: Physical Medicine and Rehabilitation

## 2021-11-29 ENCOUNTER — Other Ambulatory Visit: Payer: Self-pay

## 2021-11-29 DIAGNOSIS — M47812 Spondylosis without myelopathy or radiculopathy, cervical region: Secondary | ICD-10-CM | POA: Diagnosis not present

## 2021-11-29 DIAGNOSIS — M5412 Radiculopathy, cervical region: Secondary | ICD-10-CM

## 2021-11-29 NOTE — Discharge Instructions (Signed)

## 2021-12-02 ENCOUNTER — Institutional Professional Consult (permissible substitution): Payer: No Typology Code available for payment source | Admitting: Plastic Surgery

## 2021-12-05 ENCOUNTER — Other Ambulatory Visit: Payer: Self-pay

## 2021-12-05 ENCOUNTER — Ambulatory Visit (INDEPENDENT_AMBULATORY_CARE_PROVIDER_SITE_OTHER): Payer: No Typology Code available for payment source | Admitting: Plastic Surgery

## 2021-12-05 ENCOUNTER — Encounter: Payer: Self-pay | Admitting: Plastic Surgery

## 2021-12-05 ENCOUNTER — Other Ambulatory Visit (HOSPITAL_COMMUNITY)
Admission: RE | Admit: 2021-12-05 | Discharge: 2021-12-05 | Disposition: A | Discharge status: Home / Self Care | Payer: No Typology Code available for payment source | Source: Ambulatory Visit | Attending: Plastic Surgery | Admitting: Plastic Surgery

## 2021-12-05 VITALS — BP 158/94 | HR 72 | Ht 70.0 in | Wt 131.8 lb

## 2021-12-05 DIAGNOSIS — C441122 Basal cell carcinoma of skin of right lower eyelid, including canthus: Secondary | ICD-10-CM | POA: Diagnosis not present

## 2021-12-05 DIAGNOSIS — L989 Disorder of the skin and subcutaneous tissue, unspecified: Secondary | ICD-10-CM | POA: Diagnosis not present

## 2021-12-05 DIAGNOSIS — C44619 Basal cell carcinoma of skin of left upper limb, including shoulder: Secondary | ICD-10-CM | POA: Diagnosis not present

## 2021-12-05 NOTE — Progress Notes (Signed)
Procedure Note  Preoperative Dx: changing skin lesions Left shoulder Right lower eyelid  Postoperative Dx: Same  Procedure:  Excision of changing skin lesion left shoulder 1 x 2 cm Biopsy of right lower eyelid changing skin lesion 3 mm   Anesthesia: Lidocaine 1% with 1:100,000 epinephrine  Indication for Procedure: skin lesions  Description of Procedure: Risks and complications were explained to the patient.  Consent was confirmed and the patient understands the risks and benefits.  The potential complications and alternatives were explained and the patient consents.  The patient expressed understanding the option of not having the procedure and the risks of a scar.  Time out was called and all information was confirmed to be correct.    Shoulder:  The area was prepped and drapped.  Lidocaine 1% with epinepherine was injected in the subcutaneous area.  After waiting several minutes for the local to take affect a #15 blade was used to excise the area in an eliptical pattern with 95mm borders.  The skin edges were reapproximated with 4-0 Monocryl.    Eye lid:  The area was prepped and drapped.  Lidocaine 1% with epinepherine was injected in the subcutaneous area.  After waiting several minutes for the local to take affect a 3 mm punch biopsy was obtained.  The skin edges were reapproximated with 6-0 Monocryl subcuticular running closure.   A dressing was applied.  The patient was given instructions on how to care for the area and a follow up appointment.  Johnathan Harvey tolerated the procedure well and there were no complications. The specimens were then sent to pathology.

## 2021-12-07 LAB — SURGICAL PATHOLOGY

## 2021-12-13 DIAGNOSIS — M47812 Spondylosis without myelopathy or radiculopathy, cervical region: Secondary | ICD-10-CM | POA: Diagnosis not present

## 2021-12-13 DIAGNOSIS — M62838 Other muscle spasm: Secondary | ICD-10-CM | POA: Diagnosis not present

## 2021-12-13 DIAGNOSIS — M5030 Other cervical disc degeneration, unspecified cervical region: Secondary | ICD-10-CM | POA: Diagnosis not present

## 2021-12-13 DIAGNOSIS — M5412 Radiculopathy, cervical region: Secondary | ICD-10-CM | POA: Diagnosis not present

## 2021-12-19 NOTE — Progress Notes (Signed)
Patient is a 66 year old male here for follow-up after excision of left shoulder changing skin lesion and right lower eyelid changing skin lesion with Dr. Marla Roe on 12/05/2021.  He is 2 weeks postop.  His incisions were closed with Monocryl. ? ?Patient reports he is doing well, he reports he has an appointment with the skin surgery center for Mohs excision of his right medial eyelid basal cell carcinoma on April 14.  He reports that he is aware of the pathology results.  He knows that he will need the left shoulder basal cell carcinoma to be reexcised.  He would like to do that with [place] here in the clinic. ? ?On exam left shoulder incision is intact and healing well.  No erythema or cellulitic changes.  No subcutaneous fluid collection noted. ? ?On exam right lower eyelid incision has some scabbing, lesion is present.  There is no erythema or cellulitic changes. ? ?We will plan to excise the left shoulder residual basal cell carcinoma in the next 1 to 2 months.  Recommend calling with questions or concerns.  Pictures were taken and placed in patient's chart with patient permission ? ? ?

## 2021-12-20 ENCOUNTER — Ambulatory Visit (INDEPENDENT_AMBULATORY_CARE_PROVIDER_SITE_OTHER): Payer: No Typology Code available for payment source | Admitting: Surgical

## 2021-12-20 ENCOUNTER — Other Ambulatory Visit: Payer: Self-pay

## 2021-12-20 DIAGNOSIS — L989 Disorder of the skin and subcutaneous tissue, unspecified: Secondary | ICD-10-CM

## 2022-01-02 DIAGNOSIS — M62838 Other muscle spasm: Secondary | ICD-10-CM | POA: Diagnosis not present

## 2022-01-02 DIAGNOSIS — M47812 Spondylosis without myelopathy or radiculopathy, cervical region: Secondary | ICD-10-CM | POA: Diagnosis not present

## 2022-01-02 DIAGNOSIS — M5030 Other cervical disc degeneration, unspecified cervical region: Secondary | ICD-10-CM | POA: Diagnosis not present

## 2022-01-02 DIAGNOSIS — M5412 Radiculopathy, cervical region: Secondary | ICD-10-CM | POA: Diagnosis not present

## 2022-01-03 ENCOUNTER — Other Ambulatory Visit: Payer: Self-pay | Admitting: Family Medicine

## 2022-01-14 ENCOUNTER — Emergency Department
Admission: EM | Admit: 2022-01-14 | Discharge: 2022-01-14 | Disposition: A | Discharge status: Home / Self Care | Payer: No Typology Code available for payment source | Attending: Emergency Medicine | Admitting: Emergency Medicine

## 2022-01-14 ENCOUNTER — Other Ambulatory Visit: Payer: Self-pay

## 2022-01-14 DIAGNOSIS — I10 Essential (primary) hypertension: Secondary | ICD-10-CM | POA: Diagnosis not present

## 2022-01-14 DIAGNOSIS — R519 Headache, unspecified: Secondary | ICD-10-CM | POA: Diagnosis present

## 2022-01-14 DIAGNOSIS — G43909 Migraine, unspecified, not intractable, without status migrainosus: Secondary | ICD-10-CM | POA: Insufficient documentation

## 2022-01-14 DIAGNOSIS — Z85828 Personal history of other malignant neoplasm of skin: Secondary | ICD-10-CM | POA: Insufficient documentation

## 2022-01-14 MED ORDER — DIPHENHYDRAMINE HCL 50 MG/ML IJ SOLN
12.5000 mg | Freq: Once | INTRAMUSCULAR | Status: AC
  Administered 2022-01-14: 12.5 mg via INTRAVENOUS
  Filled 2022-01-14: qty 1

## 2022-01-14 MED ORDER — METOCLOPRAMIDE HCL 5 MG/ML IJ SOLN
10.0000 mg | Freq: Once | INTRAMUSCULAR | Status: AC
  Administered 2022-01-14: 10 mg via INTRAVENOUS
  Filled 2022-01-14: qty 2

## 2022-01-14 MED ORDER — KETOROLAC TROMETHAMINE 30 MG/ML IJ SOLN
15.0000 mg | Freq: Once | INTRAMUSCULAR | Status: AC
Start: 2022-01-14 — End: 2022-01-14
  Administered 2022-01-14: 15 mg via INTRAVENOUS
  Filled 2022-01-14: qty 1

## 2022-01-14 MED ORDER — SODIUM CHLORIDE 0.9 % IV BOLUS
1000.0000 mL | Freq: Once | INTRAVENOUS | Status: AC
  Administered 2022-01-14: 1000 mL via INTRAVENOUS

## 2022-01-14 NOTE — ED Notes (Signed)
Unable to sign d/t pad not working d/c papers given to patient with understanding. ?

## 2022-01-14 NOTE — ED Provider Notes (Signed)
? ?[facility] ?Emergency Department Provider Note ?____________________________________________ ? ? Event Date/Time  ? First MD Initiated Contact with Patient 01/14/22 1836   ?  (approximate) ? ?I have reviewed the triage vital signs and the nursing notes. ? ? ?HISTORY ? ?Chief Complaint ?Migraine ? ?HPI ?Johnathan Harvey is a 66 y.o. male with history migraines and as listed below presents to the emergency department for treatment and evaluation of headache.  Patient states that he has a migraine headache "about every day."  He has been taking medication as prescribed but has not gotten any relief today.  States headache is no different than any other except its not easing up.  No relief with Excedrin ? ?Past Medical History:  ?Diagnosis Date  ? Arthritis   ? Esophageal stricture   ? GERD (gastroesophageal reflux disease)   ? History of MRSA infection 2016  ? knee wound  ? Hypertension   ? was put on medications in [place], stopped shortly after, no longer needs meds  ? Skin cancer, basal cell   ? ? ?Allergies ?Protonix [pantoprazole] ?____________________________________________ ? ? ?PHYSICAL EXAM: ? ?VITAL SIGNS: ?ED Triage Vitals  ?Enc Vitals Group  ?   BP 01/14/22 1828 (!) 147/98  ?   Pulse Rate 01/14/22 1828 77  ?   Resp 01/14/22 1828 18  ?   Temp 01/14/22 1828 98.8 ?F (37.1 ?C)  ?   Temp Source 01/14/22 1828 Oral  ?   SpO2 01/14/22 1828 95 %  ?   Weight 01/14/22 1826 130 lb (59 kg)  ?   Height 01/14/22 1826 '5\' 10"'$  (1.778 m)  ?   Head Circumference --   ?   Peak Flow --   ?   Pain Score 01/14/22 1826 9  ?   Pain Loc --   ?   Pain Edu? --   ?   Excl. in [place]? --   ? ? ?Constitutional: Alert and oriented.  ?Head: Atraumatic. ?Nose: No congestion/rhinnorhea. ?Respiratory: Normal respiratory effort.  No retractions. Lungs CTAB. ?Musculoskeletal: No lower extremity tenderness nor edema.  ?Neurologic:  Normal speech and language. No gross focal neurologic deficits are appreciated. No gait  instability.  ?Skin:  Skin is warm, dry and intact. No rash noted. ?Psychiatric: Mood and affect are normal. Speech and behavior are normal. ? ?____________________________________________ ?  ?LABS ?(all labs ordered are listed, but only abnormal results are displayed) ? ?Labs Reviewed - No data to display ?____________________________________________ ? ?EKG ? ?Not indicated ?____________________________________________ ? ?RADIOLOGY ? ?ED MD interpretation:   ? ?Not indicated ?I, Sherrie George, personally viewed and evaluated these images (plain radiographs) as part of my medical decision making, as well as reviewing the written report by the radiologist. ?____________________________________________ ? ? ?PROCEDURES ? ?Procedure(s) performed (including Critical Care): ? ?Procedures ? ?____________________________________________ ? ? ?INITIAL IMPRESSION / ASSESSMENT AND PLAN  ? ?  ?66 year old male presenting to the emergency department for treatment and evaluation of acute on chronic headache.  See HPI for further details.  Plan will be to give migraine cocktail and reevaluate. ? ?DIFFERENTIAL DIAGNOSIS ? ?Intracranial pathology, atypical migraine, typical migraine ? ?ED COURSE ? ?Headache has resolved. Patient feels ready for discharge. He was advised to keep his appointment with neurology. In the meantime, if symptoms return, change, or worsen he is to return to the ER. ? ?  ? ?__________________________________________ ? ? ?FINAL CLINICAL IMPRESSION(S) / ED DIAGNOSES ? ?Final diagnoses:  ?Migraine without status migrainosus, not intractable, unspecified  migraine type  ? ? ? ?ED Discharge Orders   ? ? None  ? ?  ? ? ?Note:  This document was prepared using Dragon voice recognition software and may include unintentional dictation errors. ? ?  ?Victorino Dike, FNP ?01/14/22 2053 ? ?  ?Naaman Plummer, MD ?01/15/22 1106 ? ?

## 2022-01-14 NOTE — ED Triage Notes (Signed)
Pt states he has had a migraine since Friday- pt has a hx of migraines and states this feels like his usual ones ?

## 2022-01-23 ENCOUNTER — Emergency Department
Admission: EM | Admit: 2022-01-23 | Discharge: 2022-01-23 | Disposition: A | Discharge status: Home / Self Care | Payer: No Typology Code available for payment source | Attending: Emergency Medicine | Admitting: Emergency Medicine

## 2022-01-23 ENCOUNTER — Other Ambulatory Visit: Payer: Self-pay

## 2022-01-23 DIAGNOSIS — G43909 Migraine, unspecified, not intractable, without status migrainosus: Secondary | ICD-10-CM | POA: Diagnosis present

## 2022-01-23 MED ORDER — DEXTROSE 5 % IV SOLN
20.0000 mg | Freq: Once | INTRAVENOUS | Status: AC
  Administered 2022-01-23: 20 mg via INTRAVENOUS
  Filled 2022-01-23: qty 4

## 2022-01-23 MED ORDER — SODIUM CHLORIDE 0.9 % IV BOLUS
500.0000 mL | Freq: Once | INTRAVENOUS | Status: AC
  Administered 2022-01-23: 500 mL via INTRAVENOUS

## 2022-01-23 MED ORDER — KETOROLAC TROMETHAMINE 30 MG/ML IJ SOLN
30.0000 mg | Freq: Once | INTRAMUSCULAR | Status: AC
  Administered 2022-01-23: 30 mg via INTRAMUSCULAR
  Filled 2022-01-23: qty 1

## 2022-01-23 MED ORDER — DIPHENHYDRAMINE HCL 50 MG/ML IJ SOLN
25.0000 mg | Freq: Once | INTRAMUSCULAR | Status: AC
  Administered 2022-01-23: 25 mg via INTRAVENOUS
  Filled 2022-01-23: qty 1

## 2022-01-23 NOTE — ED Provider Notes (Signed)
? ?[facility] ?Provider Note ? ? ? Event Date/Time  ? First MD Initiated Contact with Patient 01/23/22 0759   ?  (approximate) ? ? ?History  ? ?Migraine ? ? ?HPI ? ?Johnathan Harvey is a 66 y.o. male with history of migraine headaches presents today with complaints of atypical migraine.  Patient reports the pain typically starts in his neck.  He reports usually he needs an IV with medications but he would prefer to just have a shot if possible.  No neurodeficits.  He is seeing his neurologist tomorrow to discuss possible surgery.  Has a pain medicine clinic appointment as well. ?  ? ? ?Physical Exam  ? ?Triage Vital Signs: ?ED Triage Vitals  ?Enc Vitals Group  ?   BP 01/23/22 0755 (!) 163/93  ?   Pulse Rate 01/23/22 0755 61  ?   Resp 01/23/22 0755 16  ?   Temp 01/23/22 0755 98.3 ?F (36.8 ?C)  ?   Temp Source 01/23/22 0755 Oral  ?   SpO2 01/23/22 0755 99 %  ?   Weight 01/23/22 0801 59 kg (130 lb 1.1 oz)  ?   Height 01/23/22 0801 1.778 m ('5\' 10"'$ )  ?   Head Circumference --   ?   Peak Flow --   ?   Pain Score 01/23/22 0744 9  ?   Pain Loc --   ?   Pain Edu? --   ?   Excl. in [place]? --   ? ? ?Most recent vital signs: ?Vitals:  ? 01/23/22 0755  ?BP: (!) 163/93  ?Pulse: 61  ?Resp: 16  ?Temp: 98.3 ?F (36.8 ?C)  ?SpO2: 99%  ? ? ? ?General: Awake, no distress.  ?CV:  Good peripheral perfusion.  ?Resp:  Normal effort.  ?Abd:  No distention.  ?Other:  No neurodeficits ? ? ?ED Results / Procedures / Treatments  ? ?Labs ?(all labs ordered are listed, but only abnormal results are displayed) ?Labs Reviewed - No data to display ? ? ?EKG ? ? ? ? ?RADIOLOGY ? ? ? ? ?PROCEDURES: ? ?Critical Care performed:  ? ?Procedures ? ? ?MEDICATIONS ORDERED IN ED: ?Medications  ?ketorolac (TORADOL) 30 MG/ML injection 30 mg (30 mg Intramuscular Given 01/23/22 0841)  ?sodium chloride 0.9 % bolus 500 mL (500 mLs Intravenous New Bag/Given 01/23/22 1013)  ?diphenhydrAMINE (BENADRYL) injection 25 mg (25 mg Intravenous Given 01/23/22  1014)  ?metoCLOPramide (REGLAN) 20 mg in dextrose 5 % 50 mL IVPB (20 mg Intravenous New Bag/Given 01/23/22 1013)  ? ? ? ?IMPRESSION / MDM / ASSESSMENT AND PLAN / ED COURSE  ?I reviewed the triage vital signs and the nursing notes. ? ?Consistent with patient's migraine, no red flag symptoms, reassuring exam.  Will treat with IM Toradol, if does not provide relief will do IV headache cocktail ? ?Only mild improvement with IM Toradol, will give IV Benadryl, IV Reglan.  He reports he has a ride home ? ?----------------------------------------- ?10:39 AM on 01/23/2022 ?----------------------------------------- ?Patient reports his migraine has resolved, appropriate for discharge at this time ? ? ? ?  ? ? ?FINAL CLINICAL IMPRESSION(S) / ED DIAGNOSES  ? ?Final diagnoses:  ?Migraine without status migrainosus, not intractable, unspecified migraine type  ? ? ? ?Rx / DC Orders  ? ?ED Discharge Orders   ? ? None  ? ?  ? ? ? ?Note:  This document was prepared using Dragon voice recognition software and may include unintentional dictation errors. ?  ?Lavonia Drafts,  MD ?01/23/22 1039 ? ?

## 2022-01-23 NOTE — ED Notes (Signed)
See triage note  presents with "migraine" h/a    states pain is mainly to back of neck  hx of same states he gets them daily   ?

## 2022-01-23 NOTE — ED Triage Notes (Signed)
Pt c/o migraine since last night, states he gets them daily. Thinks it has something to do with his back  ?

## 2022-01-24 DIAGNOSIS — M5412 Radiculopathy, cervical region: Secondary | ICD-10-CM | POA: Diagnosis not present

## 2022-01-24 DIAGNOSIS — M62838 Other muscle spasm: Secondary | ICD-10-CM | POA: Diagnosis not present

## 2022-01-24 DIAGNOSIS — M47812 Spondylosis without myelopathy or radiculopathy, cervical region: Secondary | ICD-10-CM | POA: Diagnosis not present

## 2022-01-24 DIAGNOSIS — M5030 Other cervical disc degeneration, unspecified cervical region: Secondary | ICD-10-CM | POA: Diagnosis not present

## 2022-01-26 ENCOUNTER — Emergency Department
Admission: EM | Admit: 2022-01-26 | Discharge: 2022-01-26 | Disposition: A | Discharge status: Home / Self Care | Payer: No Typology Code available for payment source | Attending: Emergency Medicine | Admitting: Emergency Medicine

## 2022-01-26 ENCOUNTER — Encounter: Payer: Self-pay | Admitting: Emergency Medicine

## 2022-01-26 ENCOUNTER — Other Ambulatory Visit: Payer: Self-pay

## 2022-01-26 DIAGNOSIS — Z85828 Personal history of other malignant neoplasm of skin: Secondary | ICD-10-CM | POA: Diagnosis not present

## 2022-01-26 DIAGNOSIS — G43909 Migraine, unspecified, not intractable, without status migrainosus: Secondary | ICD-10-CM | POA: Diagnosis not present

## 2022-01-26 DIAGNOSIS — I10 Essential (primary) hypertension: Secondary | ICD-10-CM | POA: Insufficient documentation

## 2022-01-26 DIAGNOSIS — R110 Nausea: Secondary | ICD-10-CM | POA: Diagnosis present

## 2022-01-26 DIAGNOSIS — C44319 Basal cell carcinoma of skin of other parts of face: Secondary | ICD-10-CM | POA: Diagnosis not present

## 2022-01-26 MED ORDER — KETOROLAC TROMETHAMINE 30 MG/ML IJ SOLN
30.0000 mg | Freq: Once | INTRAMUSCULAR | Status: AC
  Administered 2022-01-26: 30 mg via INTRAVENOUS
  Filled 2022-01-26: qty 1

## 2022-01-26 MED ORDER — SODIUM CHLORIDE 0.9 % IV BOLUS
1000.0000 mL | Freq: Once | INTRAVENOUS | Status: AC
  Administered 2022-01-26: 1000 mL via INTRAVENOUS

## 2022-01-26 MED ORDER — DIPHENHYDRAMINE HCL 50 MG/ML IJ SOLN
25.0000 mg | Freq: Once | INTRAMUSCULAR | Status: AC
  Administered 2022-01-26: 25 mg via INTRAVENOUS
  Filled 2022-01-26: qty 1

## 2022-01-26 MED ORDER — METOCLOPRAMIDE HCL 5 MG/ML IJ SOLN
10.0000 mg | Freq: Once | INTRAMUSCULAR | Status: AC
  Administered 2022-01-26: 10 mg via INTRAVENOUS
  Filled 2022-01-26: qty 2

## 2022-01-26 NOTE — ED Triage Notes (Signed)
Pt comes into the ED via POV c/o migraine that started yesterday evening.  Pt states he has a h/o of the migraines and his next appt with the neurologist is in May.  Pt neurologically intact at this time with even and unlabored respirations and in NAD.  Pt does admit to nausea.  Pt states he also had a recent basal cell removed off his nose.   ?

## 2022-01-26 NOTE — Discharge Instructions (Addendum)
Continue to drink lots of fluids today to stay hydrated.  Also follow-up with your primary care Dr. Sabra Heck and keep your appointment with the neurologist about your persistent headaches. ?

## 2022-01-26 NOTE — ED Provider Notes (Signed)
? ?[facility] ?Provider Note ? ? ? Event Date/Time  ? First MD Initiated Contact with Patient 01/26/22 1112   ?  (approximate) ? ? ?History  ? ?Migraine ? ? ?HPI ? ?Johnathan Harvey is a 66 y.o. male presents to the ED today with complaint of migraine headache that feels like his usual migraine without any changes.  Patient is currently seeing a neurologist and states that he is not on any preventative migraine medicine.  He does report nausea but no vomiting.  Patient is seen by Dr. Cornelia Copa for headache disorder at [facility].  He states that in the past the migraine cocktail that he has been given IV has relieved his headaches.  In addition to migraines patient has a history of arthritis, GERD, hypertension and basal cell skin cancer.  He rates his pain as 9 out of 10. ?  ? ? ?Physical Exam  ? ?Triage Vital Signs: ?ED Triage Vitals  ?Enc Vitals Group  ?   BP 01/26/22 1110 (!) 173/99  ?   Pulse Rate 01/26/22 1110 67  ?   Resp 01/26/22 1110 16  ?   Temp 01/26/22 1110 98.1 ?F (36.7 ?C)  ?   Temp Source 01/26/22 1110 Oral  ?   SpO2 01/26/22 1110 99 %  ?   Weight 01/26/22 1111 130 lb 1.1 oz (59 kg)  ?   Height 01/26/22 1111 '5\' 10"'$  (1.778 m)  ?   Head Circumference --   ?   Peak Flow --   ?   Pain Score 01/26/22 1110 9  ?   Pain Loc --   ?   Pain Edu? --   ?   Excl. in [place]? --   ? ? ?Most recent vital signs: ?Vitals:  ? 01/26/22 1110  ?BP: (!) 173/99  ?Pulse: 67  ?Resp: 16  ?Temp: 98.1 ?F (36.7 ?C)  ?SpO2: 99%  ? ? ? ?General: Awake, no distress.  Photosensitivity.  Able to answer questions appropriately. ?CV:  Good peripheral perfusion.  Heart regular rate and rhythm. ?Resp:  Normal effort.  Lungs are clear bilaterally. ?Abd:  No distention.  ?Other:  PERRLA, EOMI's.  Cranial nerves II through XII grossly intact.  Grip strength bilaterally is equal.  Speech is normal.  Good muscle strength bilaterally. ? ? ?ED Results / Procedures / Treatments  ? ?Labs ?(all labs ordered are listed, but only  abnormal results are displayed) ?Labs Reviewed - No data to display ? ? ? ? ?PROCEDURES: ? ?Critical Care performed:  ? ?Procedures ? ? ?MEDICATIONS ORDERED IN ED: ?Medications  ?sodium chloride 0.9 % bolus 1,000 mL (0 mLs Intravenous Stopped 01/26/22 1334)  ?metoCLOPramide (REGLAN) injection 10 mg (10 mg Intravenous Given 01/26/22 1155)  ?ketorolac (TORADOL) 30 MG/ML injection 30 mg (30 mg Intravenous Given 01/26/22 1157)  ?diphenhydrAMINE (BENADRYL) injection 25 mg (25 mg Intravenous Given 01/26/22 1155)  ? ? ? ?IMPRESSION / MDM / ASSESSMENT AND PLAN / ED COURSE  ?I reviewed the triage vital signs and the nursing notes. ? ? ?Differential diagnosis includes, but is not limited to, migraine, cluster headaches, muscle contraction headache. ? ?----------------------------------------- ?1:25 PM on 01/26/2022 ?----------------------------------------- ?Reports headache is much better.   ? ?66 year old male presents to the ED with complaint of his migraine headache.  Patient states that this is his usual feeling migraine and he does not take daily medication for migraine prevention.  Patient presents with nausea with photophobia and is neurologically intact.  He expresses  in the past that the migraine cocktail that he gets usually gets rid of his headache.  IV fluids were started along with Reglan, Benadryl and Toradol.  Patient voices that 1325 that he is feeling much better.  Patient is to continue seeing his PCP and neurologist.  Patient was discharged and was ambulatory without any difficulty. ? ? ?FINAL CLINICAL IMPRESSION(S) / ED DIAGNOSES  ? ?Final diagnoses:  ?Migraine without status migrainosus, not intractable, unspecified migraine type  ? ? ? ?Rx / DC Orders  ? ?ED Discharge Orders   ? ? None  ? ?  ? ? ? ?Note:  This document was prepared using Dragon voice recognition software and may include unintentional dictation errors. ?  ?Johnn Hai, PA-C ?01/26/22 1335 ? ?  ?Blake Divine, MD ?01/27/22 1056 ? ?

## 2022-01-31 ENCOUNTER — Other Ambulatory Visit: Payer: Self-pay

## 2022-01-31 ENCOUNTER — Emergency Department
Admission: EM | Admit: 2022-01-31 | Discharge: 2022-01-31 | Disposition: A | Discharge status: Home / Self Care | Payer: No Typology Code available for payment source | Source: Home / Self Care | Attending: Emergency Medicine | Admitting: Emergency Medicine

## 2022-01-31 ENCOUNTER — Emergency Department
Admission: EM | Admit: 2022-01-31 | Discharge: 2022-01-31 | Discharge status: Against Medical Advice | Payer: No Typology Code available for payment source | Attending: Emergency Medicine | Admitting: Emergency Medicine

## 2022-01-31 DIAGNOSIS — G43909 Migraine, unspecified, not intractable, without status migrainosus: Secondary | ICD-10-CM | POA: Insufficient documentation

## 2022-01-31 DIAGNOSIS — Z5321 Procedure and treatment not carried out due to patient leaving prior to being seen by health care provider: Secondary | ICD-10-CM | POA: Insufficient documentation

## 2022-01-31 MED ORDER — KETOROLAC TROMETHAMINE 30 MG/ML IJ SOLN
30.0000 mg | Freq: Once | INTRAMUSCULAR | Status: AC
  Administered 2022-01-31: 30 mg via INTRAVENOUS
  Filled 2022-01-31: qty 1

## 2022-01-31 MED ORDER — DIPHENHYDRAMINE HCL 50 MG/ML IJ SOLN
50.0000 mg | Freq: Once | INTRAMUSCULAR | Status: AC
  Administered 2022-01-31: 50 mg via INTRAVENOUS
  Filled 2022-01-31: qty 1

## 2022-01-31 MED ORDER — SODIUM CHLORIDE 0.9 % IV BOLUS
1000.0000 mL | Freq: Once | INTRAVENOUS | Status: AC
Start: 2022-01-31 — End: 2022-01-31
  Administered 2022-01-31: 1000 mL via INTRAVENOUS

## 2022-01-31 MED ORDER — METOCLOPRAMIDE HCL 5 MG/ML IJ SOLN
10.0000 mg | Freq: Once | INTRAMUSCULAR | Status: AC
  Administered 2022-01-31: 10 mg via INTRAVENOUS
  Filled 2022-01-31: qty 2

## 2022-01-31 MED ORDER — ONDANSETRON 4 MG PO TBDP
ORAL_TABLET | ORAL | Status: AC
  Filled 2022-01-31: qty 1

## 2022-01-31 MED ORDER — ONDANSETRON 4 MG PO TBDP
4.0000 mg | ORAL_TABLET | Freq: Once | ORAL | Status: AC
  Administered 2022-01-31: 4 mg via ORAL

## 2022-01-31 NOTE — ED Triage Notes (Signed)
Pt in with co migraine states hx of the same. States took excedrin at home without relief. Says when he comes the "headache cocktail" works.  ?

## 2022-01-31 NOTE — ED Provider Notes (Signed)
? ?[facility] ?Provider Note ? ? ? Event Date/Time  ? First MD Initiated Contact with Patient 01/31/22 1506   ?  (approximate) ? ?History  ? ?Chief Complaint: Migraine ? ?HPI ? ?Johnathan Harvey is a 66 y.o. male with a past medical history of migraines presents to the emergency department for headache.  According to the patient for the past several years he has been experiencing migraines.  Patient states he sees a neurologist and they have placed him on gabapentin for chronic neck pain which they believe is the cause of the patient's migraines.  He states approximately once a week he requires going to the hospital or his neurologist for a "cocktail" of medications through an IV for his headaches.  He states over the last 2 days he has had a more significant headache and thought he needed to come for a migraine cocktail per patient.  Patient denies any weakness or numbness.  Denies any fever.  States this feels identical to his typical migraine with photophobia, phonophobia and nausea/vomiting. ? ?Physical Exam  ? ?Triage Vital Signs: ?ED Triage Vitals [01/31/22 1359]  ?Enc Vitals Group  ?   BP (!) 161/95  ?   Pulse Rate 70  ?   Resp 18  ?   Temp 98.5 ?F (36.9 ?C)  ?   Temp src   ?   SpO2 96 %  ?   Weight   ?   Height   ?   Head Circumference   ?   Peak Flow   ?   Pain Score   ?   Pain Loc   ?   Pain Edu?   ?   Excl. in [place]?   ? ? ?Most recent vital signs: ?Vitals:  ? 01/31/22 1359  ?BP: (!) 161/95  ?Pulse: 70  ?Resp: 18  ?Temp: 98.5 ?F (36.9 ?C)  ?SpO2: 96%  ? ? ?General: Awake, no distress.  ?CV:  Good peripheral perfusion.  Regular rate and rhythm  ?Resp:  Normal effort.  Equal breath sounds bilaterally.  ?Abd:  No distention.  Soft, nontender.  No rebound or guarding. ? ? ?ED Results / Procedures / Treatments  ? ? ?MEDICATIONS ORDERED IN ED: ?Medications  ?ketorolac (TORADOL) 30 MG/ML injection 30 mg (has no administration in time range)  ?metoCLOPramide (REGLAN) injection 10 mg (has no  administration in time range)  ?diphenhydrAMINE (BENADRYL) injection 50 mg (has no administration in time range)  ?sodium chloride 0.9 % bolus 1,000 mL (has no administration in time range)  ?ondansetron (ZOFRAN-ODT) disintegrating tablet 4 mg (4 mg Oral Not Given 01/31/22 1407)  ? ? ? ?IMPRESSION / MDM / ASSESSMENT AND PLAN / ED COURSE  ?I reviewed the triage vital signs and the nursing notes. ? ?Patient presents emergency department for migraine-like headache.  States symptoms started 2 days ago but have been fairly constant for the past several years.  Patient already follows up with neurology.  States this feels identical to prior migraines.  Patient denies any red flags on my questioning.  No weakness no numbness.  I reviewed the patient's chart he had a CT scan approximately 1 year ago and an MRI 2 years ago with no acute findings.  We will treat with a migraine cocktail of Toradol, Reglan, Benadryl, IV fluids and reassess.  Patient agreeable to plan. ? ?Patient states his headache is now gone.  We will discharge patient home with neurology follow-up.  Patient agreeable to plan. ? ?FINAL  CLINICAL IMPRESSION(S) / ED DIAGNOSES  ? ?Migraine headache ?Acute on chronic headache ? ? ?Note:  This document was prepared using Dragon voice recognition software and may include unintentional dictation errors. ?  ?Harvest Dark, MD ?01/31/22 1737 ? ?

## 2022-01-31 NOTE — ED Triage Notes (Signed)
Pt c/o migraine for the past 2 years and see a neurologist, states he is having N/V with it today and its getting worse. Pt is a/ox4 on arrival, pt LWBS earlier hoping it would get better but it worsened. ?

## 2022-01-31 NOTE — ED Notes (Signed)
Pt has red splotches around IV site, pt has no complaints MD made aware  ? ?

## 2022-01-31 NOTE — ED Notes (Signed)
Pt gave verbal consent to DC ° °

## 2022-02-01 DIAGNOSIS — G44221 Chronic tension-type headache, intractable: Secondary | ICD-10-CM | POA: Diagnosis not present

## 2022-02-01 DIAGNOSIS — M5116 Intervertebral disc disorders with radiculopathy, lumbar region: Secondary | ICD-10-CM | POA: Diagnosis not present

## 2022-02-03 DIAGNOSIS — K222 Esophageal obstruction: Secondary | ICD-10-CM | POA: Diagnosis not present

## 2022-02-07 ENCOUNTER — Other Ambulatory Visit (HOSPITAL_COMMUNITY)
Admission: RE | Admit: 2022-02-07 | Discharge: 2022-02-07 | Disposition: A | Discharge status: Home / Self Care | Payer: No Typology Code available for payment source | Source: Ambulatory Visit | Attending: Plastic Surgery | Admitting: Plastic Surgery

## 2022-02-07 ENCOUNTER — Ambulatory Visit (INDEPENDENT_AMBULATORY_CARE_PROVIDER_SITE_OTHER): Payer: No Typology Code available for payment source | Admitting: Plastic Surgery

## 2022-02-07 ENCOUNTER — Encounter: Payer: Self-pay | Admitting: Plastic Surgery

## 2022-02-07 DIAGNOSIS — C4491 Basal cell carcinoma of skin, unspecified: Secondary | ICD-10-CM | POA: Insufficient documentation

## 2022-02-07 DIAGNOSIS — C44619 Basal cell carcinoma of skin of left upper limb, including shoulder: Secondary | ICD-10-CM | POA: Insufficient documentation

## 2022-02-07 DIAGNOSIS — L570 Actinic keratosis: Secondary | ICD-10-CM | POA: Diagnosis not present

## 2022-02-07 DIAGNOSIS — L905 Scar conditions and fibrosis of skin: Secondary | ICD-10-CM | POA: Diagnosis not present

## 2022-02-07 NOTE — Progress Notes (Signed)
Procedure Note ? 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 ?Postoperative Dx: Same ? ?Procedure: Excision of 1 x 4.5 cm BCC ? ?Anesthesia: Lidocaine 1% with 1:100,000 epinephrine ? ?Indication for Procedure: BCC ? ?Description of Procedure: ?Risks and complications were explained to the patient.  Consent was confirmed and the patient understands the risks and benefits.  The potential complications and alternatives were explained and the patient consents.  The patient expressed understanding the option of not having the procedure and the risks of a scar.  Time out was called and all information was confirmed to be correct.   ? ?The area was prepped and drapped.  Lidocaine 1% with epinepherine was injected in the subcutaneous area.  After waiting several minutes for the local to take affect a #15 blade was used to excise the area in an eliptical pattern with 12m borders from the biopsy site.  A 5-0 Monocryl was used to close the deep layers with simple interrupted stitches.  The skin edges were reapproximated with 5-0 Monocryl subcuticular running closure.  A dressing was applied.  The patient was given instructions on how to care for the area and a follow up appointment.  Johnathan Harvey tolerated the procedure well and there were no complications. The specimen was sent to pathology. ?

## 2022-02-07 NOTE — Addendum Note (Signed)
Addended by: Wallace Going on: 02/07/2022 11:56 AM ? ? Modules accepted: Orders ? ?

## 2022-02-08 LAB — SURGICAL PATHOLOGY

## 2022-02-09 ENCOUNTER — Encounter: Payer: Self-pay | Admitting: Plastic Surgery

## 2022-02-14 ENCOUNTER — Ambulatory Visit: Payer: No Typology Code available for payment source | Admitting: Plastic Surgery

## 2022-02-14 DIAGNOSIS — M109 Gout, unspecified: Secondary | ICD-10-CM | POA: Insufficient documentation

## 2022-02-14 DIAGNOSIS — F1291 Cannabis use, unspecified, in remission: Secondary | ICD-10-CM | POA: Insufficient documentation

## 2022-02-14 DIAGNOSIS — G894 Chronic pain syndrome: Secondary | ICD-10-CM | POA: Insufficient documentation

## 2022-02-14 DIAGNOSIS — M899 Disorder of bone, unspecified: Secondary | ICD-10-CM | POA: Insufficient documentation

## 2022-02-14 DIAGNOSIS — Z789 Other specified health status: Secondary | ICD-10-CM | POA: Insufficient documentation

## 2022-02-14 DIAGNOSIS — R937 Abnormal findings on diagnostic imaging of other parts of musculoskeletal system: Secondary | ICD-10-CM | POA: Insufficient documentation

## 2022-02-14 DIAGNOSIS — Z79899 Other long term (current) drug therapy: Secondary | ICD-10-CM | POA: Insufficient documentation

## 2022-02-14 NOTE — Progress Notes (Addendum)
Patient: Johnathan Harvey  Service Category: E/M  Provider: Oswaldo Done, MD  DOB: [birth year]  DOS: 02/15/2022  Referring Provider: Denton Lank, FNP  MRN: [id number]  Setting: Ambulatory outpatient  PCP: Danella Penton, MD  Type: New Patient  Specialty: Interventional Pain Management    Location: Office  Delivery: Face-to-face     Primary Reason(s) for Visit: Encounter for initial evaluation of one or more chronic problems (new to examiner) potentially causing chronic pain, and posing a threat to normal musculoskeletal function. (Level of risk: High) CC: Neck Pain (base)  HPI  Johnathan Harvey is a 66 y.o. year old, male patient, who comes for the first time to our practice referred by Meeler, Jodelle Gross, FNP for our initial evaluation of his chronic pain. He has History of opiate overdose (HCC); Cellulitis and abscess of leg; Hypertension; GERD (gastroesophageal reflux disease); Arthritis; Changing skin lesion; Basal cell carcinoma; Cervical disc disease; Cervical myofascial pain syndrome; Chest pain; Chronic tension-type headache, intractable; Dysphagia; Emesis; Esophageal stricture; Gout; Headache disorder; History of hepatitis C; Intestinal metaplasia of gastric mucosa; Medicare annual wellness visit, initial; History of narcotic abuse (HCC); Cervicalgia (1ry area of Pain) (Right); Pancreatic ductal abnormality; Septic prepatellar bursitis of knee (Left); Gastroesophageal reflux disease without esophagitis; History of marijuana use; Abnormal MRI, cervical spine (09/28/2021); Chronic pain syndrome; Pharmacologic therapy; Disorder of skeletal system; Problems influencing health status; Grade 1 Anterolisthesis of cervical spine (C7/T1); Cervical facet arthropathy (Multilevel) (Bilateral); DDD (degenerative disc disease), cervical; 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 Today he comes in for evaluation of his Neck Pain (base)  Pain Assessment: Location: Right (base of neck) Neck Radiating: middle of upper back Onset: More than a month ago Duration: Chronic pain Quality: Discomfort, Aching, Constant, Headache Severity: 2 /10 (subjective, self-reported pain score)  Effect on ADL: turning, movement Timing: Constant Modifying factors: cold cloths to , frequent trips to ED for headaches BP: (!) 153/86  HR: 73  Onset and Duration: Present longer than 3 months Cause of pain: Arthritis Severity: Getting worse, NAS-11 at its worse: 10/10, NAS-11 at its best: 0/10, NAS-11 now: 2/10, and NAS-11 on the average: 3/10 Timing: Morning and Night Aggravating Factors: Prolonged sitting Alleviating Factors:  none Associated Problems: Pain that does not allow patient to sleep Quality of Pain: Aching Previous Examinations or Tests: CT scan, MRI scan, Nerve block, X-rays, Neurological evaluation, and Orthopedic evaluation Previous Treatments: Epidural steroid injections, Facet blocks, Physical Therapy, and Radiofrequency  According to the patient he has a prior history of drug addiction/abuse as well as alcoholism.  He has not had any alcohol in the past 5 years.  He refers that for this reason he would like to stay away from any addictive type of medication.  According to the patient the primary area of pain is that of the neck (posterior aspect) (Right).  He denies any neck surgeries, but he has had multiple procedures done in the cervical area.  Some of them were done by Merri Ray, DO and some were done at a different practice in [facility]) approximately 5  months ago.  He was unable to recall the name of the practice  or the physician.  He refers that out of the procedures that he has done, the only one that seems to have helped was the one that he had done in [place] which apparently lasted close to 1 month.  The patient's secondary area pain is that of the right occipital region where he refers experiencing headaches associated with the neck pain.  He denies any surgeries in that area.  The patient's third area pain is that of the right upper back around the right trapezius/suprascapular muscle area.  Pharmacotherapy: Elavil 10 mg tablet p.o. at bedtime; Voltaren 50 mg tablet; gabapentin (Neurontin) 300 mg capsule (1200 mg/day).  Physical exam: Range of motion of the cervical spine is relatively within normal limits except when rotating towards the the left side, as well as lateral bending towards the left side which triggers contralateral neck pain in the area of the trapezius and suprascapular region.  Today I took the time to provide the patient with information regarding my pain practice. The patient was informed that my practice is divided into two sections: an interventional pain management section, as well as a completely separate and distinct medication management section. I explained that I have procedure days for my interventional therapies, and evaluation days for follow-ups and medication management. Because of the amount of documentation required during both, they are kept separated. This means that there is the possibility that he may be scheduled for a procedure on one day, and medication management the next. I have also informed him that because of staffing and facility limitations, I no longer take patients for medication management only. To illustrate the reasons for this, I gave the patient the example of surgeons, and how inappropriate it would be to refer a patient to his/her care, just to write for the post-surgical antibiotics on a  surgery done by a different surgeon.   Because interventional pain management is my board-certified specialty, the patient was informed that joining my practice means that they are open to any and all interventional therapies. I made it clear that this does not mean that they will be forced to have any procedures done. What this means is that I believe interventional therapies to be essential part of the diagnosis and proper management of chronic pain conditions. Therefore, patients not interested in these interventional alternatives will be better served under the care of a different practitioner.  The patient was also made aware of my Comprehensive Pain Management Safety Guidelines where by joining my practice, they limit all of their nerve blocks and joint injections to those done by our practice, for as long as we are retained to manage their care.   Historic Controlled Substance Pharmacotherapy Review  PMP and historical list of controlled substances: Diazepam 2 mg tablet; tramadol 50 mg tablet; oxycodone IR 5 mg tablet Current opioid analgesics: None MME/day: 0 mg/day  Historical Monitoring: The patient  reports that he does not currently use drugs after having used the following drugs: Marijuana. List of all UDS Test(s): Lab Results  Component Value Date   MDMA NONE DETECTED 10/10/2019   MDMA NONE DETECTED 05/02/2015   COCAINSCRNUR NONE DETECTED 10/10/2019   COCAINSCRNUR NONE DETECTED 05/02/2015   PCPSCRNUR NONE DETECTED 10/10/2019   PCPSCRNUR NONE DETECTED 05/02/2015   THCU POSITIVE (A) 10/10/2019   THCU POSITIVE (A) 05/02/2015   ETH <10 10/10/2019   ETH <5 05/02/2015   List of other Serum/Urine Drug Screening Test(s):  Lab Results  Component Value Date   COCAINSCRNUR NONE DETECTED  10/10/2019   COCAINSCRNUR NONE DETECTED 05/02/2015   THCU POSITIVE (A) 10/10/2019   THCU POSITIVE (A) 05/02/2015   ETH <10 10/10/2019   ETH <5 05/02/2015   Historical Background Evaluation: [place]  PMP: PDMP reviewed during this encounter. Online review of the past 54-month period conducted.             PMP NARX Score Report:  Narcotic: 050 Sedative: 050 Stimulant: 000 [place] Department of public safety, offender search: Engineer, mining Information) Non-contributory Risk Assessment Profile: Aberrant behavior: None observed or detected today Risk factors for fatal opioid overdose: None identified today PMP NARX Overdose Risk Score: 190 Fatal overdose hazard ratio (HR): Calculation deferred Non-fatal overdose hazard ratio (HR): Calculation deferred Risk of opioid abuse or dependence: 0.7-3.0% with doses ? 36 MME/day and 6.1-26% with doses ? 120 MME/day. Substance use disorder (SUD) risk level: See below Personal History of Substance Abuse (SUD-Substance use disorder):  Alcohol: Positive Male or Male (5 years clean)  Illegal Drugs: Positive Male or Male (addicted to [employer], buying on streets, 2 year clean)  Rx Drugs: Positive Male or Male (Percocet that was given for arthritis)  ORT Risk Level calculation: High Risk  Opioid Risk Tool - 02/15/22 0915       Family History of Substance Abuse   Alcohol Positive Male   father   Illegal Drugs Negative    Rx Drugs Negative      Personal History of Substance Abuse   Alcohol Positive Male or Male   5 years clean   Illegal Drugs Positive Male or Male   addicted to Percocet, buying on streets, 2 year clean   Rx Drugs Positive Male or Male   Percocet that was given for arthritis     Psychological Disease   Psychological Disease Negative    Depression Negative      Total Score   Opioid Risk Tool Scoring 15    Opioid Risk Interpretation High Risk            ORT Scoring interpretation table:  Score <3 = Low Risk for SUD  Score between 4-7 = Moderate Risk for SUD  Score >8 = High Risk for Opioid Abuse   PHQ-2 Depression Scale:  Total score: 0  PHQ-2 Scoring interpretation table: (Score and probability of major depressive  disorder)  Score 0 = No depression  Score 1 = 15.4% Probability  Score 2 = 21.1% Probability  Score 3 = 38.4% Probability  Score 4 = 45.5% Probability  Score 5 = 56.4% Probability  Score 6 = 78.6% Probability   PHQ-9 Depression Scale:  Total score: 2  PHQ-9 Scoring interpretation table:  Score 0-4 = No depression  Score 5-9 = Mild depression  Score 10-14 = Moderate depression  Score 15-19 = Moderately severe depression  Score 20-27 = Severe depression (2.4 times higher risk of SUD and 2.89 times higher risk of overuse)   Pharmacologic Plan: As per protocol, I have not taken over any controlled substance management, pending the results of ordered tests and/or consults.            Initial impression: Pending review of available data and ordered tests.  Meds   Current Outpatient Medications:    amitriptyline (ELAVIL) 10 MG tablet, Take 2 tablets by mouth at bedtime., Disp: , Rfl:    diclofenac (VOLTAREN) 50 MG EC tablet, Take 1 tablet by mouth daily., Disp: , Rfl:    gabapentin (NEURONTIN) 300 MG capsule, Take 300 mg by mouth 3 (three)  times daily., Disp: , Rfl:   Imaging Review  Cervical Imaging: Cervical MR wo contrast: Results for orders placed during the hospital encounter of 09/17/21 MR CERVICAL SPINE WO CONTRAST  Narrative CLINICAL DATA:  Daily headaches for 2 years, worsening. No known injury.  EXAM: MRI CERVICAL SPINE WITHOUT CONTRAST  TECHNIQUE: Multiplanar, multisequence MR imaging of the cervical spine was performed. No intravenous contrast was administered.  COMPARISON:  MRI cervical spine 11/12/2019.  FINDINGS: Alignment: Trace anterolisthesis C7 on T1 due to facet arthropathy is unchanged.  Vertebrae: No fracture, evidence of discitis, or bone lesion. Degenerative endplate signal change at C5-6 and C6-7 seen on the prior examination is improved. Again seen is a hemangioma in T1.  Cord: Normal signal throughout.  Posterior Fossa, vertebral arteries,  paraspinal tissues: Negative.  Disc levels:  C2-3: Negative.  C3-4: Minimal disc bulge. Bilateral facet degenerative disease again seen. Marrow edema in the right facets present on the prior examination has almost completely resolved. No stenosis.  C4-5: Shallow disc bulge and bilateral uncovertebral disease. No stenosis or change.  C5-6: Loss of disc space height. Disc osteophyte complex eccentric to the left and left worse than right uncovertebral disease are again seen. Mild flattening of the ventral cord and left much worse than right foraminal narrowing appear unchanged.  C6-7: Disc osteophyte complex and bilateral uncovertebral disease. Deformity of the ventral cord and effacement of CSF about the cord are unchanged. Severe bilateral foraminal narrowing also appears unchanged.  C7-T1: Right worse than left facet degenerative disease again seen. No disc bulge or protrusion. No stenosis.  IMPRESSION: No new abnormality since the prior MRI.  Marrow edema in the right facets at C3-4 seen on the prior MRI has almost completely resolved.  Effacement of CSF about the cord at C6-7 and mild deformity of the ventral cord are again seen. Severe bilateral foraminal narrowing is also unchanged.  No change in mild flattening of the ventral cord and left much worse than right foraminal stenosis at C5-6.   Electronically Signed By: Drusilla Kanner M.D. On: 09/28/2021 10:02  Cervical CT wo contrast: Results for orders placed during the hospital encounter of 12/07/20 CT Cervical Spine Wo Contrast  Narrative CLINICAL DATA:  Pain following fall  EXAM: CT HEAD WITHOUT CONTRAST  CT CERVICAL SPINE WITHOUT CONTRAST  TECHNIQUE: Multidetector CT imaging of the head and cervical spine was performed following the standard protocol without intravenous contrast. Multiplanar CT image reconstructions of the cervical spine were also generated.  COMPARISON:  Head CT October 10, 2019; brain MRI October 11, 2019; cervical MRI November 12, 2019; cervical spine CT May 01, 2015  FINDINGS: CT HEAD FINDINGS  Brain: The ventricles and sulci are normal in size and configuration. There is no intracranial mass, hemorrhage, extra-axial fluid collection, or midline shift. The brain parenchyma appears unremarkable. There is no evident acute infarct.  Vascular: No hyperdense vessel. There is calcification in the left distal vertebral artery and in each carotid siphon region.  Skull: Bony calvarium appears intact.  Sinuses/Orbits: There is mild mucosal thickening in several anterior ethmoid air cells. Orbits appear symmetric bilaterally except for evidence of previous cataract removal on the right.  Other: Mastoid air cells are clear.  CT CERVICAL SPINE FINDINGS  Alignment: There is no appreciable spondylolisthesis.  Skull base and vertebrae: Skull base and craniocervical junction regions appear normal. No acute fracture. No blastic or lytic bone lesions.  Soft tissues and spinal canal: Prevertebral soft tissues and predental space regions are  normal. No evident cord or canal hematoma. No paraspinous lesions are evident.  Disc levels: There is moderately severe disc space narrowing at C5-6 and C6-7 with slightly milder disc space narrowing at C4-5. There are prominent anterior osteophytes at C4, C5, C6, and C7. There is facet hypertrophy at multiple levels. There is exit foraminal narrowing due to bony hypertrophy at C3-4 bilaterally, C4-5 bilaterally, C5-6 on the left, and at C6-7 bilaterally. Impression on exiting nerve roots is most severe at C5-6 on the left and at C6-7 bilaterally. There is no frank disc extrusion or high-grade stenosis.  Upper chest: Visualized upper lung regions are clear.  Other: There is a stable 1.1 cm focus of calcification in the right thyroid. Per consensus guidelines, this finding does not warrant additional imaging  surveillance. Calcification noted in the proximal left subclavian artery.  IMPRESSION: CT head: Normal appearing brain parenchyma.  No mass or hemorrhage.  Foci of arterial vascular calcification noted. Mild mucosal thickening noted in several ethmoid air cells.  CT cervical spine:  1.  No appreciable fracture or spondylolisthesis.  2. Multilevel arthropathy with exit foraminal narrowing at several levels as noted. No frank disc extrusion or stenosis.  3.  Calcification in the proximal left subclavian artery.   Electronically Signed By: Bretta Bang III M.D. On: 12/07/2020 10:48  Shoulder Imaging: Shoulder-R DG: Results for orders placed during the hospital encounter of 02/04/20 DG Shoulder Right  Narrative CLINICAL DATA:  Right shoulder pain after injury. Pain with movement.  EXAM: RIGHT SHOULDER - 2+ VIEW  COMPARISON:  Included portion from chest radiograph 10/10/2019  FINDINGS: No acute fracture. There is questionable widening of the acromioclavicular joint on Grashey view in slight superior offset of the distal clavicle on transscapular Y-view. Coracoclavicular space is maintained. Moderate acromioclavicular osteoarthritis with inferiorly directed spurs. 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  IMPRESSION: 1. Questionable widening of the acromioclavicular joint at the acromioclavicular joint, suggesting AC separation. No acute fracture. 2. 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   Electronically Signed By: Narda Rutherford M.D. On: 02/04/2020 19:10  Wrist Imaging: Wrist-L DG Complete: Results for orders placed during the hospital encounter of 12/07/20 DG Wrist Complete Left  Narrative CLINICAL DATA:  Pain following fall  EXAM: LEFT WRIST - COMPLETE 3+ VIEW  COMPARISON:  None.  FINDINGS: Frontal, oblique, lateral, and ulnar deviation scaphoid images were obtained. No fracture  or dislocation. The joint spaces in the wrist region appear normal. There is a benign cyst in the distal scaphoid bone. There is narrowing of the third and second MCP joints. No erosion.  IMPRESSION: No fracture or dislocation. Osteoarthritic change in the second and third MCP joints. Other joint spaces appear unremarkable. Benign cyst in distal scaphoid.   Electronically Signed By: Bretta Bang III M.D. On: 12/07/2020 11:19  Complexity Note: Imaging results reviewed. Results shared with Johnathan Harvey, using Layman's terms.                        ROS  Cardiovascular: No reported cardiovascular signs or symptoms such as High blood pressure, coronary artery disease, abnormal heart rate or rhythm, heart attack, blood thinner therapy or heart weakness and/or failure Pulmonary or Respiratory: No reported pulmonary signs or symptoms such as wheezing and difficulty taking a deep full breath (Asthma), difficulty blowing air out (Emphysema), coughing up mucus (Bronchitis), persistent dry cough, or temporary stoppage of breathing during sleep Neurological: No reported neurological signs or symptoms  such as seizures, abnormal skin sensations, urinary and/or fecal incontinence, being born with an abnormal open spine and/or a tethered spinal cord Psychological-Psychiatric: No reported psychological or psychiatric signs or symptoms such as difficulty sleeping, anxiety, depression, delusions or hallucinations (schizophrenial), mood swings (bipolar disorders) or suicidal ideations or attempts Gastrointestinal: No reported gastrointestinal signs or symptoms such as vomiting or evacuating blood, reflux, heartburn, alternating episodes of diarrhea and constipation, inflamed or scarred liver, or pancreas or irrregular and/or infrequent bowel movements Genitourinary: No reported renal or genitourinary signs or symptoms such as difficulty voiding or producing urine, peeing blood, non-functioning kidney, kidney  stones, difficulty emptying the bladder, difficulty controlling the flow of urine, or chronic kidney disease Hematological: Brusing easily Endocrine: No reported endocrine signs or symptoms such as high or low blood sugar, rapid heart rate due to high thyroid levels, obesity or weight gain due to slow thyroid or thyroid disease Rheumatologic: No reported rheumatological signs and symptoms such as fatigue, joint pain, tenderness, swelling, redness, heat, stiffness, decreased range of motion, with or without associated rash Musculoskeletal: Negative for myasthenia gravis, muscular dystrophy, multiple sclerosis or malignant hyperthermia Work History: Working part time  Allergies  Johnathan Harvey is allergic to protonix [pantoprazole].  Laboratory Chemistry Profile   Renal Lab Results  Component Value Date   BUN 18 09/17/2021   CREATININE 0.61 09/17/2021   GFRAA >60 10/10/2019   GFRNONAA >60 09/17/2021   PROTEINUR 30 (A) 12/29/2020     Electrolytes Lab Results  Component Value Date   NA 137 09/17/2021   K 3.7 09/17/2021   CL 105 09/17/2021   CALCIUM 9.1 09/17/2021   MG 2.1 03/11/2021     Hepatic Lab Results  Component Value Date   AST 22 09/17/2021   ALT 18 09/17/2021   ALBUMIN 3.9 09/17/2021   ALKPHOS 67 09/17/2021   LIPASE 30 09/17/2021     ID Lab Results  Component Value Date   SARSCOV2NAA NEGATIVE 03/11/2021     Bone No results found for: VD25OH, [id number], [id number], GN5621HY8, 25OHVITD1, 25OHVITD2, 25OHVITD3, TESTOFREE, TESTOSTERONE   Endocrine Lab Results  Component Value Date   GLUCOSE 110 (H) 09/17/2021   GLUCOSEU NEGATIVE 12/29/2020   HGBA1C 5.3 05/02/2015   TSH 0.903 05/02/2015     Neuropathy Lab Results  Component Value Date   HGBA1C 5.3 05/02/2015     CNS No results found for: COLORCSF, APPEARCSF, RBCCOUNTCSF, WBCCSF, POLYSCSF, LYMPHSCSF, EOSCSF, PROTEINCSF, GLUCCSF, JCVIRUS, CSFOLI, IGGCSF, LABACHR, ACETBL, LABACHR, ACETBL   Inflammation  (CRP: Acute  ESR: Chronic) Lab Results  Component Value Date   LATICACIDVEN 0.8 10/10/2019     Rheumatology No results found for: RF, ANA, LABURIC, URICUR, LYMEIGGIGMAB, LYMEABIGMQN, HLAB27   Coagulation Lab Results  Component Value Date   INR 1.02 05/02/2015   LABPROT 13.6 05/02/2015   PLT 390 09/17/2021     Cardiovascular Lab Results  Component Value Date   CKTOTAL 292 10/10/2019   TROPONINI <0.03 12/09/2017   HGB 13.3 09/17/2021   HCT 39.1 09/17/2021     Screening Lab Results  Component Value Date   SARSCOV2NAA NEGATIVE 03/11/2021   COVIDSOURCE NASOPHARYNGEAL 09/23/2019     Cancer No results found for: CEA, CA125, LABCA2   Allergens No results found for: ALMOND, APPLE, ASPARAGUS, AVOCADO, BANANA, BARLEY, BASIL, BAYLEAF, GREENBEAN, LIMABEAN, WHITEBEAN, BEEFIGE, REDBEET, BLUEBERRY, BROCCOLI, CABBAGE, MELON, CARROT, CASEIN, CASHEWNUT, CAULIFLOWER, CELERY     Note: Lab results reviewed.  PFSH  Drug: Johnathan Harvey  reports that he does not currently use  drugs after having used the following drugs: Marijuana. Alcohol:  reports no history of alcohol use. Tobacco:  reports that he has quit smoking. His smoking use included cigarettes. He has never used smokeless tobacco. Medical:  has a past medical history of Arthritis, Esophageal stricture, GERD (gastroesophageal reflux disease), History of MRSA infection (2016), Hypertension, and Skin cancer, basal cell. Family: family history includes Cancer in his father and mother.  Past Surgical History:  Procedure Laterality Date   ANKLE ARTHROPLASTY     ESOPHAGOGASTRODUODENOSCOPY N/A 11/04/2018   Procedure: ESOPHAGOGASTRODUODENOSCOPY (EGD);  Surgeon: Toney Reil, MD;  Location: [facility];  Service: Gastroenterology;  Laterality: N/A;   INGUINAL HERNIA REPAIR Left 09/26/2019   Procedure: LEFT INGUINAL HERNIA REPAIR;  Surgeon: Luretha Murphy, MD;  Location: [facility];  Service: General;  Laterality:  Left;   Active Ambulatory Problems    Diagnosis Date Noted   History of opiate overdose (HCC) 05/02/2015   Cellulitis and abscess of leg 07/28/2015   Hypertension 07/30/2013   GERD (gastroesophageal reflux disease) 07/28/2015   Arthritis 07/28/2015   Changing skin lesion 11/25/2021   Basal cell carcinoma 02/07/2022   Cervical disc disease 11/24/2021   Cervical myofascial pain syndrome 03/30/2021   Chest pain 07/29/2016   Chronic tension-type headache, intractable 03/30/2021   Dysphagia 07/18/2013   Emesis 07/02/2021   Esophageal stricture 07/18/2013   Gout 02/14/2022   Headache disorder 09/21/2019   History of hepatitis C 11/24/2021   Intestinal metaplasia of gastric mucosa 12/25/2014   Medicare annual wellness visit, initial 11/24/2021   History of narcotic abuse (HCC) 08/01/2016   Cervicalgia (1ry area of Pain) (Right) 09/21/2019   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   Septic prepatellar bursitis of knee (Left) 09/07/2020   Gastroesophageal reflux disease without esophagitis 07/28/2015   History of marijuana use 02/14/2022   Abnormal MRI, cervical spine (09/28/2021) 02/14/2022   Chronic pain syndrome 02/14/2022   Pharmacologic therapy 02/14/2022   Disorder of skeletal system 02/14/2022   Problems influencing health status 02/14/2022   Grade 1 Anterolisthesis of cervical spine (C7/T1) 02/15/2022   Cervical facet arthropathy (Multilevel) (Bilateral) 02/15/2022   DDD (degenerative disc disease), cervical 02/15/2022   Cervical foraminal stenosis (Bilateral: C5-6, C6-7) 02/15/2022   Cervicogenic headache (2ry area of Pain) (Right) 02/15/2022   Cervical facet syndrome 02/15/2022   Spondylosis without myelopathy or radiculopathy, cervical region 02/15/2022   History of abnormal drug screen 02/15/2022   Occipital headache (Right) 02/15/2022   Neck pain of over 3 months duration 02/15/2022   Cervical neck pain with evidence of disc disease 02/15/2022   Chronic neck and back  pain 02/15/2022   Musculoskeletal disorder involving upper trapezius muscle (Right) 02/15/2022   Resolved Ambulatory Problems    Diagnosis Date Noted   No Resolved Ambulatory Problems   Past Medical History:  Diagnosis Date   History of MRSA infection 2016   Skin cancer, basal cell    Constitutional Exam  General appearance: Well nourished, well developed, and well hydrated. In no apparent acute distress Vitals:   02/15/22 0906  BP: (!) 153/86  Pulse: 73  Resp: 16  Temp: 97.6 F (36.4 C)  SpO2: 99%  Weight: 130 lb (59 kg)  Height: 5\' 10"  (1.778 m)   BMI Assessment: Estimated body mass index is 18.65 kg/m as calculated from the following:   Height as of this encounter: 5\' 10"  (1.778 m).   Weight as of this encounter: 130 lb (59 kg).  BMI interpretation table: BMI level  Category Range association with higher incidence of chronic pain  <18 kg/m2 Underweight   18.5-24.9 kg/m2 Ideal body weight   25-29.9 kg/m2 Overweight Increased incidence by 20%  30-34.9 kg/m2 Obese (Class I) Increased incidence by 68%  35-39.9 kg/m2 Severe obesity (Class II) Increased incidence by 136%  >40 kg/m2 Extreme obesity (Class III) Increased incidence by 254%   Patient's current BMI Ideal Body weight  Body mass index is 18.65 kg/m. Ideal body weight: 73 kg (160 lb 15 oz)   BMI Readings from Last 4 Encounters:  02/15/22 18.65 kg/m  01/31/22 18.65 kg/m  01/26/22 18.66 kg/m  01/23/22 18.66 kg/m   Wt Readings from Last 4 Encounters:  02/15/22 130 lb (59 kg)  01/31/22 130 lb (59 kg)  01/26/22 130 lb 1.1 oz (59 kg)  01/23/22 130 lb 1.1 oz (59 kg)    Psych/Mental status: Alert, oriented x 3 (person, place, & time)       Eyes: PERLA Respiratory: No evidence of acute respiratory distress  Assessment  Primary Diagnosis & Pertinent Problem List: The primary encounter diagnosis was Chronic pain syndrome. Diagnoses of Cervicalgia, Cervical facet syndrome, Grade 1 Anterolisthesis of cervical  spine (C7/T1), Cervical facet arthropathy (Multilevel) (Bilateral), Spondylosis without myelopathy or radiculopathy, cervical region, DDD (degenerative disc disease), cervical, Cervicogenic headache, Cervical foraminal stenosis (Bilateral: C5-6, C6-7), Occipital headache (Right), Neck pain of over 3 months duration, Cervical neck pain with evidence of disc disease, Chronic neck and back pain, Musculoskeletal disorder involving upper trapezius muscle (Right), Abnormal MRI, cervical spine (09/28/2021), Pharmacologic therapy, History of narcotic abuse (HCC), History of marijuana use, History of abnormal drug screen, Disorder of skeletal system, and Problems influencing health status were also pertinent to this visit.  Visit Diagnosis (New problems to examiner): 1. Chronic pain syndrome   2. Cervicalgia   3. Cervical facet syndrome   4. Grade 1 Anterolisthesis of cervical spine (C7/T1)   5. Cervical facet arthropathy (Multilevel) (Bilateral)   6. Spondylosis without myelopathy or radiculopathy, cervical region   7. DDD (degenerative disc disease), cervical   8. Cervicogenic headache   9. Cervical foraminal stenosis (Bilateral: C5-6, C6-7)   10. Occipital headache (Right)   11. Neck pain of over 3 months duration   12. Cervical neck pain with evidence of disc disease   13. Chronic neck and back pain   14. Musculoskeletal disorder involving upper trapezius muscle (Right)   15. Abnormal MRI, cervical spine (09/28/2021)   16. Pharmacologic therapy   17. History of narcotic abuse (HCC)   18. History of marijuana use   19. History of abnormal drug screen   20. Disorder of skeletal system   21. Problems influencing health status    Plan of Care (Initial workup plan)  Note: Johnathan Harvey was reminded that as per protocol, today's visit has been an evaluation only. We have not taken over the patient's controlled substance management.  Problem-specific plan: No problem-specific Assessment & Plan notes  found for this encounter.  Lab Orders  No laboratory test(s) ordered today   Imaging Orders         DG Cervical Spine With Flex & Extend     Referral Orders  No referral(s) requested today   Procedure Orders         Cervical Epidural Injection     Pharmacotherapy (current): Medications ordered:  No orders of the defined types were placed in this encounter.  Medications administered during this visit: Linkoln Reynolds. Nuttle had no medications administered during this  visit.   Pharmacological management options:  Opioid Analgesics: The patient was informed that there is no guarantee that he would be a candidate for opioid analgesics. The decision will be made following CDC guidelines. This decision will be based on the results of diagnostic studies, as well as Johnathan Harvey's risk profile.   Membrane stabilizer: To be determined at a later time  Muscle relaxant: To be determined at a later time  NSAID: To be determined at a later time  Other analgesic(s): To be determined at a later time   Interventional management options: Johnathan Harvey was informed that there is no guarantee that he would be a candidate for interventional therapies. The decision will be based on the results of diagnostic studies, as well as Johnathan Harvey's risk profile.  Procedure(s) under consideration:  Pending results of ordered studies      Interventional Therapies  Risk  Complexity Considerations:   Estimated body mass index is 18.65 kg/m as calculated from the following:   Height as of this encounter: 5\' 10"  (1.778 m).   Weight as of this encounter: 130 lb (59 kg). WNL   Planned  Pending:   Diagnostic right Cervical ESI #1    Under consideration:   Diagnostic right Cervical ESI #1    Completed:   None at this time   Completed by other providers:   Therapeutic right C3 and C4 medial branch RFA x1 (07/30/2020) by Merri Ray, DO [facility])  Diagnostic right C3 and C4 MBB x2 (06/30/2020,  07/15/2020) by Merri Ray, DO [facility] PMR)  Diagnostic right C3-4 zygapophyseal joint (facet joint) inj. x1 (03/05/2020) by Merri Ray, DO   Therapeutic  Palliative (PRN) options:   None established    Provider-requested follow-up: Return for ( ), Proc-day (T,Th), (Clinic) procedure: (R) CESI #1 (Review of XR).  Future Appointments  Date Time Provider Department Center  02/28/2022  8:45 AM Dillingham, Alena Bills, DO [facility] None    Note by: Oswaldo Done, MD Date: 02/15/2022; Time: 9:23 AM

## 2022-02-15 ENCOUNTER — Encounter: Payer: Self-pay | Admitting: Pain Medicine

## 2022-02-15 ENCOUNTER — Ambulatory Visit (HOSPITAL_BASED_OUTPATIENT_CLINIC_OR_DEPARTMENT_OTHER): Payer: No Typology Code available for payment source | Admitting: Pain Medicine

## 2022-02-15 ENCOUNTER — Ambulatory Visit
Admission: RE | Admit: 2022-02-15 | Discharge: 2022-02-15 | Disposition: A | Discharge status: Home / Self Care | Payer: No Typology Code available for payment source | Source: Ambulatory Visit | Attending: Pain Medicine | Admitting: Pain Medicine

## 2022-02-15 ENCOUNTER — Ambulatory Visit
Admission: RE | Admit: 2022-02-15 | Discharge: 2022-02-15 | Disposition: A | Discharge status: Home / Self Care | Payer: No Typology Code available for payment source | Attending: Pain Medicine | Admitting: Pain Medicine

## 2022-02-15 VITALS — BP 153/86 | HR 73 | Temp 97.6°F | Resp 16 | Ht 70.0 in | Wt 130.0 lb

## 2022-02-15 DIAGNOSIS — M4312 Spondylolisthesis, cervical region: Secondary | ICD-10-CM | POA: Diagnosis not present

## 2022-02-15 DIAGNOSIS — R519 Headache, unspecified: Secondary | ICD-10-CM

## 2022-02-15 DIAGNOSIS — M629 Disorder of muscle, unspecified: Secondary | ICD-10-CM | POA: Insufficient documentation

## 2022-02-15 DIAGNOSIS — M5030 Other cervical disc degeneration, unspecified cervical region: Secondary | ICD-10-CM | POA: Insufficient documentation

## 2022-02-15 DIAGNOSIS — M549 Dorsalgia, unspecified: Secondary | ICD-10-CM | POA: Insufficient documentation

## 2022-02-15 DIAGNOSIS — R892 Abnormal level of other drugs, medicaments and biological substances in specimens from other organs, systems and tissues: Secondary | ICD-10-CM

## 2022-02-15 DIAGNOSIS — F1110 Opioid abuse, uncomplicated: Secondary | ICD-10-CM

## 2022-02-15 DIAGNOSIS — R937 Abnormal findings on diagnostic imaging of other parts of musculoskeletal system: Secondary | ICD-10-CM

## 2022-02-15 DIAGNOSIS — M47812 Spondylosis without myelopathy or radiculopathy, cervical region: Secondary | ICD-10-CM | POA: Insufficient documentation

## 2022-02-15 DIAGNOSIS — G8929 Other chronic pain: Secondary | ICD-10-CM | POA: Insufficient documentation

## 2022-02-15 DIAGNOSIS — M5090 Cervical disc disorder, unspecified, unspecified cervical region: Secondary | ICD-10-CM | POA: Insufficient documentation

## 2022-02-15 DIAGNOSIS — G4486 Cervicogenic headache: Secondary | ICD-10-CM | POA: Insufficient documentation

## 2022-02-15 DIAGNOSIS — M542 Cervicalgia: Secondary | ICD-10-CM

## 2022-02-15 DIAGNOSIS — M4802 Spinal stenosis, cervical region: Secondary | ICD-10-CM | POA: Insufficient documentation

## 2022-02-15 DIAGNOSIS — G894 Chronic pain syndrome: Secondary | ICD-10-CM | POA: Insufficient documentation

## 2022-02-15 DIAGNOSIS — F1291 Cannabis use, unspecified, in remission: Secondary | ICD-10-CM

## 2022-02-15 DIAGNOSIS — M899 Disorder of bone, unspecified: Secondary | ICD-10-CM | POA: Insufficient documentation

## 2022-02-15 DIAGNOSIS — Z789 Other specified health status: Secondary | ICD-10-CM

## 2022-02-15 DIAGNOSIS — Z79899 Other long term (current) drug therapy: Secondary | ICD-10-CM

## 2022-02-15 NOTE — Progress Notes (Signed)
Safety precautions to be maintained throughout the outpatient stay will include: orient to surroundings, keep bed in low position, maintain call bell within reach at all times, provide assistance with transfer out of bed and ambulation.  

## 2022-02-16 NOTE — Addendum Note (Signed)
Addended by: Milinda Pointer A on: 02/16/2022 09:23 AM ? ? Modules accepted: Orders ? ?

## 2022-02-28 ENCOUNTER — Ambulatory Visit (INDEPENDENT_AMBULATORY_CARE_PROVIDER_SITE_OTHER): Payer: No Typology Code available for payment source | Admitting: Plastic Surgery

## 2022-02-28 ENCOUNTER — Ambulatory Visit: Payer: No Typology Code available for payment source | Admitting: Plastic Surgery

## 2022-02-28 DIAGNOSIS — L989 Disorder of the skin and subcutaneous tissue, unspecified: Secondary | ICD-10-CM

## 2022-02-28 DIAGNOSIS — L570 Actinic keratosis: Secondary | ICD-10-CM

## 2022-02-28 MED ORDER — FLUOROURACIL 5 % EX CREA: TOPICAL_CREAM | Freq: Two times a day (BID) | CUTANEOUS | 0 refills | Status: AC

## 2022-02-28 NOTE — Progress Notes (Signed)
The patient is a 66 year old male here for follow-up after undergoing going excision of a skin lesion on his arm.  First it was excised and was a basal cell carcinoma with positive margins it was excised again and was an actinic keratosis with a positive deep margin.  We talked about the options for another removal versus Efudex.  The patient said he is fine with using the Efudex and if it comes back he will let me know.  I will send the Efudex in and he knows to follow-up with me if he is in need or if it reoccurs. ? ?Pictures were obtained of the patient and placed in the chart with the patient's or guardian's permission. ? ? ?

## 2022-03-11 ENCOUNTER — Other Ambulatory Visit: Payer: Self-pay

## 2022-03-11 ENCOUNTER — Emergency Department
Admission: EM | Admit: 2022-03-11 | Discharge: 2022-03-11 | Disposition: A | Discharge status: Home / Self Care | Payer: No Typology Code available for payment source | Attending: Emergency Medicine | Admitting: Emergency Medicine

## 2022-03-11 ENCOUNTER — Encounter: Payer: Self-pay | Admitting: Emergency Medicine

## 2022-03-11 DIAGNOSIS — R519 Headache, unspecified: Secondary | ICD-10-CM | POA: Diagnosis present

## 2022-03-11 DIAGNOSIS — G43809 Other migraine, not intractable, without status migrainosus: Secondary | ICD-10-CM | POA: Diagnosis not present

## 2022-03-11 DIAGNOSIS — I10 Essential (primary) hypertension: Secondary | ICD-10-CM | POA: Diagnosis not present

## 2022-03-11 DIAGNOSIS — Z85828 Personal history of other malignant neoplasm of skin: Secondary | ICD-10-CM | POA: Insufficient documentation

## 2022-03-11 LAB — BASIC METABOLIC PANEL
Anion gap: 7 (ref 5–15)
BUN: 17 mg/dL (ref 8–23)
CO2: 24 mmol/L (ref 22–32)
Calcium: 8.9 mg/dL (ref 8.9–10.3)
Chloride: 107 mmol/L (ref 98–111)
Creatinine, Ser: 0.71 mg/dL (ref 0.61–1.24)
GFR, Estimated: >60 mL/min (ref >60)
Glucose, Bld: 101 mg/dL — ABNORMAL HIGH (ref 70–99)
Potassium: 3.5 mmol/L (ref 3.5–5.1)
Sodium: 138 mmol/L (ref 135–145)

## 2022-03-11 LAB — CBC
HCT: 40.1 % (ref 39.0–52.0)
Hemoglobin: 13.9 g/dL (ref 13.0–17.0)
MCH: 31.4 pg (ref 26.0–34.0)
MCHC: 34.7 g/dL (ref 30.0–36.0)
MCV: 90.7 fL (ref 80.0–100.0)
Platelets: 341 10*3/uL (ref 150–400)
RBC: 4.42 MIL/uL (ref 4.22–5.81)
RDW: 13.1 % (ref 11.5–15.5)
WBC: 6.2 10*3/uL (ref 4.0–10.5)
nRBC: 0 % (ref 0.0–0.2)

## 2022-03-11 MED ORDER — KETOROLAC TROMETHAMINE 15 MG/ML IJ SOLN
15.0000 mg | Freq: Once | INTRAMUSCULAR | Status: AC
  Administered 2022-03-11: 15 mg via INTRAVENOUS
  Filled 2022-03-11: qty 1

## 2022-03-11 MED ORDER — SODIUM CHLORIDE 0.9 % IV BOLUS
1000.0000 mL | Freq: Once | INTRAVENOUS | Status: AC
Start: 2022-03-11 — End: 2022-03-11
  Administered 2022-03-11: 1000 mL via INTRAVENOUS

## 2022-03-11 MED ORDER — METOCLOPRAMIDE HCL 5 MG/ML IJ SOLN
10.0000 mg | Freq: Once | INTRAMUSCULAR | Status: AC
  Administered 2022-03-11: 10 mg via INTRAVENOUS
  Filled 2022-03-11: qty 2

## 2022-03-11 MED ORDER — DIPHENHYDRAMINE HCL 50 MG/ML IJ SOLN
25.0000 mg | Freq: Once | INTRAMUSCULAR | Status: AC
  Administered 2022-03-11: 25 mg via INTRAVENOUS
  Filled 2022-03-11: qty 1

## 2022-03-11 MED ORDER — METHYLPREDNISOLONE SODIUM SUCC 125 MG IJ SOLR
60.0000 mg | Freq: Once | INTRAMUSCULAR | Status: AC
  Administered 2022-03-11: 60 mg via INTRAVENOUS
  Filled 2022-03-11: qty 2

## 2022-03-11 NOTE — Discharge Instructions (Signed)
Please follow-up with your neurologist.

## 2022-03-11 NOTE — ED Triage Notes (Signed)
Pt via POV from home. Pt c/o migraine states he has a hx of migraines, this one has been going on for the past 3-4 days, denies taking any prescription medications for it. Pt also endorses nausea. Denies taking any medications for HTN at this time. Pt is A&OX4 and NAD

## 2022-03-11 NOTE — ED Provider Notes (Signed)
[facility] Provider Note    Event Date/Time   First MD Initiated Contact with Patient 03/11/22 [phone]     (approximate)   History   Migraine   HPI  JVON MERONEY is a 66 y.o. male with past medical history of hypertension GERD, migraine headaches who presents with headache.  Patient tells me that headache has been going on for the past 3 to 4 days.  It is in the posterior midline of his head.  Is rather constant.  Not relieved by Excedrin.  Says he gets a migraine almost daily and that this is typical of his migraine headaches nothing feels different.  Denies visual change or numbness tingling weakness.  Does have photophobia no phonophobia.  No recent illnesses fever.  No trauma or neck injury.  He does not take anything prophylactically for headaches.  Sees neurology.  Has had CT and MRI in the last 3 years that were normal.    Past Medical History:  Diagnosis Date   Arthritis    Esophageal stricture    GERD (gastroesophageal reflux disease)    History of MRSA infection 2016   knee wound   Hypertension    was put on medications in [place], stopped shortly after, no longer needs meds   Skin cancer, basal cell     Patient Active Problem List   Diagnosis Date Noted   Actinic keratosis 02/28/2022   Grade 1 Anterolisthesis of cervical spine (C7/T1) 02/15/2022   Cervical facet arthropathy (Multilevel) (Bilateral) 02/15/2022   DDD (degenerative disc disease), cervical 02/15/2022   Cervical foraminal stenosis (Bilateral: C5-6, C6-7) 02/15/2022   Cervicogenic headache (2ry area of Pain) (Right) 02/15/2022   Cervical facet syndrome 02/15/2022   Spondylosis without myelopathy or radiculopathy, cervical region 02/15/2022   History of abnormal drug screen 02/15/2022   Occipital headache (Right) 02/15/2022   Neck pain of over 3 months duration 02/15/2022   Cervical neck pain with evidence of disc disease 02/15/2022   Chronic neck and back pain  02/15/2022   Musculoskeletal disorder involving upper trapezius muscle (Right) 02/15/2022   Gout 02/14/2022   History of marijuana use 02/14/2022   Abnormal MRI, cervical spine (09/28/2021) 02/14/2022   Chronic pain syndrome 02/14/2022   Pharmacologic therapy 02/14/2022   Disorder of skeletal system 02/14/2022   Problems influencing health status 02/14/2022   Basal cell carcinoma 02/07/2022   Changing skin lesion 11/25/2021   Cervical disc disease 11/24/2021   History of hepatitis C 11/24/2021   Medicare annual wellness visit, initial 11/24/2021   Emesis 07/02/2021   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   Cervical myofascial pain syndrome 03/30/2021   Chronic tension-type headache, intractable 03/30/2021   Septic prepatellar bursitis of knee (Left) 09/07/2020   Headache disorder 09/21/2019   Cervicalgia (1ry area of Pain) (Right) 09/21/2019   History of narcotic abuse ([place]) 08/01/2016    Class: History of   Chest pain 07/29/2016   Cellulitis and abscess of leg 07/28/2015   GERD (gastroesophageal reflux disease) 07/28/2015   Arthritis 07/28/2015   Gastroesophageal reflux disease without esophagitis 07/28/2015   History of opiate overdose ([place]) 05/02/2015    Class: History of   Intestinal metaplasia of gastric mucosa 12/25/2014   Hypertension 07/30/2013   Dysphagia 07/18/2013   Esophageal stricture 07/18/2013     Physical Exam  Triage Vital Signs: ED Triage Vitals  Enc Vitals Group     BP 03/11/22 0640 (!) 180/100     Pulse Rate 03/11/22 0640 69  Resp 03/11/22 0640 20     Temp 03/11/22 0640 97.7 F (36.5 C)     Temp Source 03/11/22 0640 Oral     SpO2 03/11/22 0640 99 %     Weight 03/11/22 0702 130 lb (59 kg)     Height 03/11/22 0702 '5\' 10"'$  (1.778 m)     Head Circumference --      Peak Flow --      Pain Score 03/11/22 0702 9     Pain Loc --      Pain Edu? --      Excl. in [place]? --     Most recent vital signs: Vitals:   03/11/22 0640  BP: (!) 180/100   Pulse: 69  Resp: 20  Temp: 97.7 F (36.5 C)  SpO2: 99%     General: Awake, no distress. CV:  Good peripheral perfusion.  Resp:  Normal effort.  Abd:  No distention.  Neuro:             Awake, Alert, Oriented x 3  Other:  Aox3, nml speech  PERRL, EOMI, face symmetric, nml tongue movement  5/5 strength in the BL upper and lower extremities  Sensation grossly intact in the BL upper and lower extremities  Finger-nose-finger intact BL    ED Results / Procedures / Treatments  Labs (all labs ordered are listed, but only abnormal results are displayed) Labs Reviewed  BASIC METABOLIC PANEL - Abnormal; Notable for the following components:      Result Value   Glucose, Bld 101 (*)    All other components within normal limits  CBC     EKG  EKG interpreted myself shows normal sinus rhythm with left axis deviation, with left anterior fascicular block, inverted T waves in 3 and aVF V3, similar to prior EKG   RADIOLOGY    PROCEDURES:  Critical Care performed: No  Procedures  The patient is on the cardiac monitor to evaluate for evidence of arrhythmia and/or significant heart rate changes.   MEDICATIONS ORDERED IN ED: Medications  methylPREDNISolone sodium succinate (SOLU-MEDROL) 125 mg/2 mL injection 60 mg (has no administration in time range)  ketorolac (TORADOL) 15 MG/ML injection 15 mg (15 mg Intravenous Given 03/11/22 0807)  metoCLOPramide (REGLAN) injection 10 mg (10 mg Intravenous Given 03/11/22 0807)  diphenhydrAMINE (BENADRYL) injection 25 mg (25 mg Intravenous Given 03/11/22 0807)  sodium chloride 0.9 % bolus 1,000 mL (1,000 mLs Intravenous New Bag/Given 03/11/22 0806)     IMPRESSION / MDM / ASSESSMENT AND PLAN / ED COURSE  I reviewed the triage vital signs and the nursing notes.                              Differential diagnosis includes, but is not limited to, migraine headache, tension headache, analgesic rebound headache, less likely subarachnoid cerebral  venous sinus thrombosis, cervical artery dissection  The patient is a 65 year old male with a history of migraine headaches was followed by neurology and has had prior head imaging including CT and MRI who presents today with 3 to 4 days of migraine headache.  Says it is typical migraine headache for him which is posterior occipital region he has no red flag symptoms including not worst headache of life not sudden or maximal in onset and with no associated neurologic symptoms.  In addition he says this feels exactly the same as prior headaches.  I see he was in the ED about a  month ago and treated with migraine cocktail.  He is not having fevers or other infectious symptoms.  His neurologic exam is intact.  He is hypertensive but suspect this is pain related.  Will treat with migraine cocktail Toradol Benadryl fluids Reglan and reassess.    On reassessment patient is resting comfortably.  Says headache is significantly improved.  We will give a dose of Solu-Medrol just to see if this can prevent ongoing headaches.  I encouraged him to follow-up with his neurologist which she last saw 3 months ago.  FINAL CLINICAL IMPRESSION(S) / ED DIAGNOSES   Final diagnoses:  Other migraine without status migrainosus, not intractable     Rx / DC Orders   ED Discharge Orders     None        Note:  This document was prepared using Dragon voice recognition software and may include unintentional dictation errors.   Rada Hay, MD 03/11/22 [phone]

## 2022-03-15 ENCOUNTER — Ambulatory Visit: Payer: No Typology Code available for payment source | Admitting: Pain Medicine

## 2022-03-16 ENCOUNTER — Other Ambulatory Visit: Payer: Self-pay

## 2022-03-16 ENCOUNTER — Emergency Department
Admission: EM | Admit: 2022-03-16 | Discharge: 2022-03-16 | Disposition: A | Discharge status: Home / Self Care | Payer: No Typology Code available for payment source | Attending: Emergency Medicine | Admitting: Emergency Medicine

## 2022-03-16 DIAGNOSIS — I10 Essential (primary) hypertension: Secondary | ICD-10-CM | POA: Insufficient documentation

## 2022-03-16 DIAGNOSIS — R519 Headache, unspecified: Secondary | ICD-10-CM | POA: Diagnosis present

## 2022-03-16 DIAGNOSIS — G43909 Migraine, unspecified, not intractable, without status migrainosus: Secondary | ICD-10-CM | POA: Insufficient documentation

## 2022-03-16 HISTORY — DX: Migraine, unspecified, not intractable, without status migrainosus: G43.909

## 2022-03-16 LAB — COMPREHENSIVE METABOLIC PANEL
ALT: 12 U/L (ref 0–44)
AST: 17 U/L (ref 15–41)
Albumin: 4.1 g/dL (ref 3.5–5.0)
Alkaline Phosphatase: 57 U/L (ref 38–126)
Anion gap: 7 (ref 5–15)
BUN: 15 mg/dL (ref 8–23)
CO2: 27 mmol/L (ref 22–32)
Calcium: 8.9 mg/dL (ref 8.9–10.3)
Chloride: 103 mmol/L (ref 98–111)
Creatinine, Ser: 0.73 mg/dL (ref 0.61–1.24)
GFR, Estimated: >60 mL/min (ref >60)
Glucose, Bld: 88 mg/dL (ref 70–99)
Potassium: 3.6 mmol/L (ref 3.5–5.1)
Sodium: 137 mmol/L (ref 135–145)
Total Bilirubin: 1.7 mg/dL — ABNORMAL HIGH (ref 0.3–1.2)
Total Protein: 7 g/dL (ref 6.5–8.1)

## 2022-03-16 LAB — CBC
HCT: 43.7 % (ref 39.0–52.0)
Hemoglobin: 14.8 g/dL (ref 13.0–17.0)
MCH: 31 pg (ref 26.0–34.0)
MCHC: 33.9 g/dL (ref 30.0–36.0)
MCV: 91.4 fL (ref 80.0–100.0)
Platelets: 374 10*3/uL (ref 150–400)
RBC: 4.78 MIL/uL (ref 4.22–5.81)
RDW: 12.7 % (ref 11.5–15.5)
WBC: 7.2 10*3/uL (ref 4.0–10.5)
nRBC: 0 % (ref 0.0–0.2)

## 2022-03-16 MED ORDER — KETOROLAC TROMETHAMINE 30 MG/ML IJ SOLN
30.0000 mg | Freq: Once | INTRAMUSCULAR | Status: AC
  Administered 2022-03-16: 30 mg via INTRAVENOUS
  Filled 2022-03-16: qty 1

## 2022-03-16 MED ORDER — METOCLOPRAMIDE HCL 5 MG/ML IJ SOLN
10.0000 mg | Freq: Once | INTRAMUSCULAR | Status: DC
  Filled 2022-03-16: qty 2

## 2022-03-16 MED ORDER — ONDANSETRON 4 MG PO TBDP
4.0000 mg | ORAL_TABLET | Freq: Once | ORAL | Status: AC
  Administered 2022-03-16: 4 mg via ORAL
  Filled 2022-03-16: qty 1

## 2022-03-16 MED ORDER — METOCLOPRAMIDE HCL 5 MG/ML IJ SOLN
10.0000 mg | Freq: Once | INTRAMUSCULAR | Status: AC
  Administered 2022-03-16: 10 mg via INTRAVENOUS
  Filled 2022-03-16: qty 2

## 2022-03-16 MED ORDER — SODIUM CHLORIDE 0.9 % IV BOLUS: 1000.0000 mL | Freq: Once | INTRAVENOUS | Status: DC

## 2022-03-16 MED ORDER — DIPHENHYDRAMINE HCL 50 MG/ML IJ SOLN
50.0000 mg | Freq: Once | INTRAMUSCULAR | Status: DC
  Filled 2022-03-16: qty 1

## 2022-03-16 MED ORDER — KETOROLAC TROMETHAMINE 30 MG/ML IJ SOLN
30.0000 mg | Freq: Once | INTRAMUSCULAR | Status: DC
  Filled 2022-03-16: qty 1

## 2022-03-16 MED ORDER — DIPHENHYDRAMINE HCL 50 MG/ML IJ SOLN
50.0000 mg | Freq: Once | INTRAMUSCULAR | Status: AC
  Administered 2022-03-16: 50 mg via INTRAVENOUS
  Filled 2022-03-16: qty 1

## 2022-03-16 MED ORDER — SODIUM CHLORIDE 0.9 % IV BOLUS
1000.0000 mL | Freq: Once | INTRAVENOUS | Status: AC
  Administered 2022-03-16: 1000 mL via INTRAVENOUS

## 2022-03-16 NOTE — ED Provider Notes (Signed)
[facility] Provider Note    Event Date/Time   First MD Initiated Contact with Patient 03/16/22 [phone]     (approximate)  History   Chief Complaint: Migraine  HPI  Johnathan Harvey is a 66 y.o. male with a past medical history of migraines, hypertension, presents emergency department for headache.  According to the patient since yesterday morning he has been experiencing a migraine headache along with nausea vomiting and photophobia.  Patient states it feels like a migraine but the nausea is worse than his typical migraine.  No abdominal pain.  No chest pain.  No fever.  No weakness or numbness.  Physical Exam   Triage Vital Signs: ED Triage Vitals [03/16/22 0843]  Enc Vitals Group     BP (!) 157/99     Pulse Rate 70     Resp 16     Temp 98.4 F (36.9 C)     Temp Source Oral     SpO2 100 %     Weight      Height      Head Circumference      Peak Flow      Pain Score      Pain Loc      Pain Edu?      Excl. in [place]?     Most recent vital signs: Vitals:   03/16/22 0843  BP: (!) 157/99  Pulse: 70  Resp: 16  Temp: 98.4 F (36.9 C)  SpO2: 100%    General: Awake, no distress.  Moderate photophobia CV:  Good peripheral perfusion.  Regular rate and rhythm  Resp:  Normal effort.  Equal breath sounds bilaterally.  Abd:  No distention.  Soft, nontender.  No rebound or guarding.    ED Results / Procedures / Treatments   MEDICATIONS ORDERED IN ED: Medications  ketorolac (TORADOL) 30 MG/ML injection 30 mg (has no administration in time range)  metoCLOPramide (REGLAN) injection 10 mg (has no administration in time range)  diphenhydrAMINE (BENADRYL) injection 50 mg (has no administration in time range)  sodium chloride 0.9 % bolus 1,000 mL (has no administration in time range)     IMPRESSION / MDM / ASSESSMENT AND PLAN / ED COURSE  I reviewed the triage vital signs and the nursing notes.  Patient's presentation is most consistent with acute  presentation with potential threat to life or bodily function.  Patient presents emergency department for worsening headache and nausea.  Overall patient appears well however he is complaining of nausea photophobia as well as a headache.  States the headache feels like his typical migraine but nausea is worse.  We will check basic labs to rule out metabolic or electrolyte abnormality, anemia.  We will treat the patient's headache with Toradol Reglan Benadryl IV fluids and reassess.  Patient agreeable to plan.  Unable to get labs or IV after multiple attempts.  I offered to use an ultrasound to obtain an IV.  Patient states after the multiple attempts he would prefer to hold off on IV.  States his main complaint is just nausea at this time.  He would like to try a nausea tablet to see if this helps.  Patient states his nausea is better after ODT Zofran but continues to have a headache.  I placed an ultrasound-guided IV.  Patient feels much better after migraine medications.  States he is ready to go home.  FINAL CLINICAL IMPRESSION(S) / ED DIAGNOSES   Migraine headache Nausea  Note:  This document was prepared using Dragon voice recognition software and may include unintentional dictation errors.   Harvest Dark, MD 03/16/22 1246

## 2022-03-16 NOTE — ED Notes (Signed)
Called lab to run blood sent down earlier and informed them that blood work was reordered

## 2022-03-16 NOTE — ED Triage Notes (Signed)
Pt c/o migraine that started yesterday with N/V.Marland Kitchen states he has a hx of them and sees the neurologist for them, states he has not taken any medication since it started yesterday.

## 2022-03-17 ENCOUNTER — Emergency Department
Admission: EM | Admit: 2022-03-17 | Discharge: 2022-03-17 | Disposition: A | Discharge status: Home / Self Care | Payer: No Typology Code available for payment source | Attending: Emergency Medicine | Admitting: Emergency Medicine

## 2022-03-17 ENCOUNTER — Emergency Department: Payer: No Typology Code available for payment source

## 2022-03-17 DIAGNOSIS — R112 Nausea with vomiting, unspecified: Secondary | ICD-10-CM | POA: Insufficient documentation

## 2022-03-17 DIAGNOSIS — I700 Atherosclerosis of aorta: Secondary | ICD-10-CM | POA: Diagnosis not present

## 2022-03-17 DIAGNOSIS — R519 Headache, unspecified: Secondary | ICD-10-CM | POA: Insufficient documentation

## 2022-03-17 DIAGNOSIS — R824 Acetonuria: Secondary | ICD-10-CM | POA: Insufficient documentation

## 2022-03-17 DIAGNOSIS — I10 Essential (primary) hypertension: Secondary | ICD-10-CM | POA: Insufficient documentation

## 2022-03-17 DIAGNOSIS — K7689 Other specified diseases of liver: Secondary | ICD-10-CM | POA: Diagnosis not present

## 2022-03-17 LAB — URINALYSIS, ROUTINE W REFLEX MICROSCOPIC
Bilirubin Urine: NEGATIVE
Glucose, UA: NEGATIVE mg/dL
Hgb urine dipstick: NEGATIVE
Ketones, ur: 20 mg/dL — AB
Leukocytes,Ua: NEGATIVE
Nitrite: NEGATIVE
Protein, ur: NEGATIVE mg/dL
Specific Gravity, Urine: 1.027 (ref 1.005–1.030)
pH: 5 (ref 5.0–8.0)

## 2022-03-17 LAB — CBC
HCT: 46.4 % (ref 39.0–52.0)
Hemoglobin: 15.5 g/dL (ref 13.0–17.0)
MCH: 31.1 pg (ref 26.0–34.0)
MCHC: 33.4 g/dL (ref 30.0–36.0)
MCV: 93 fL (ref 80.0–100.0)
Platelets: 352 10*3/uL (ref 150–400)
RBC: 4.99 MIL/uL (ref 4.22–5.81)
RDW: 13 % (ref 11.5–15.5)
WBC: 10.9 10*3/uL — ABNORMAL HIGH (ref 4.0–10.5)
nRBC: 0 % (ref 0.0–0.2)

## 2022-03-17 LAB — COMPREHENSIVE METABOLIC PANEL
ALT: 13 U/L (ref 0–44)
AST: 20 U/L (ref 15–41)
Albumin: 4.7 g/dL (ref 3.5–5.0)
Alkaline Phosphatase: 62 U/L (ref 38–126)
Anion gap: 7 (ref 5–15)
BUN: 21 mg/dL (ref 8–23)
CO2: 25 mmol/L (ref 22–32)
Calcium: 9.3 mg/dL (ref 8.9–10.3)
Chloride: 105 mmol/L (ref 98–111)
Creatinine, Ser: 0.8 mg/dL (ref 0.61–1.24)
GFR, Estimated: >60 mL/min (ref >60)
Glucose, Bld: 107 mg/dL — ABNORMAL HIGH (ref 70–99)
Potassium: 4.3 mmol/L (ref 3.5–5.1)
Sodium: 137 mmol/L (ref 135–145)
Total Bilirubin: 1.3 mg/dL — ABNORMAL HIGH (ref 0.3–1.2)
Total Protein: 8.1 g/dL (ref 6.5–8.1)

## 2022-03-17 LAB — LIPASE, BLOOD: Lipase: 39 U/L (ref 11–51)

## 2022-03-17 MED ORDER — ONDANSETRON 4 MG PO TBDP
ORAL_TABLET | ORAL | Status: AC
  Filled 2022-03-17: qty 1

## 2022-03-17 MED ORDER — SODIUM CHLORIDE 0.9 % IV BOLUS
1000.0000 mL | Freq: Once | INTRAVENOUS | Status: AC
  Administered 2022-03-17: 1000 mL via INTRAVENOUS

## 2022-03-17 MED ORDER — ONDANSETRON 4 MG PO TBDP
4.0000 mg | ORAL_TABLET | Freq: Once | ORAL | Status: AC
  Administered 2022-03-17: 4 mg via ORAL

## 2022-03-17 MED ORDER — IOHEXOL 300 MG/ML  SOLN
80.0000 mL | Freq: Once | INTRAMUSCULAR | Status: AC | PRN
  Administered 2022-03-17: 80 mL via INTRAVENOUS

## 2022-03-17 MED ORDER — METOCLOPRAMIDE HCL 5 MG/ML IJ SOLN
10.0000 mg | Freq: Once | INTRAMUSCULAR | Status: DC
  Filled 2022-03-17: qty 2

## 2022-03-17 MED ORDER — ONDANSETRON 4 MG PO TBDP: 4.0000 mg | ORAL_TABLET | Freq: Three times a day (TID) | ORAL | 0 refills | Status: DC | PRN

## 2022-03-17 MED ORDER — PROCHLORPERAZINE EDISYLATE 10 MG/2ML IJ SOLN
10.0000 mg | Freq: Once | INTRAMUSCULAR | Status: AC
  Administered 2022-03-17: 10 mg via INTRAMUSCULAR
  Filled 2022-03-17: qty 2

## 2022-03-17 NOTE — ED Notes (Signed)
Pt in CT. Will give medicine once back

## 2022-03-17 NOTE — ED Notes (Signed)
IV removed. Redness noted and tenderness. Pt states pain is 4/10. Arm elevated and warm compress applied.

## 2022-03-17 NOTE — ED Provider Notes (Signed)
[facility] Provider Note    Event Date/Time   First MD Initiated Contact with Patient 03/17/22 1635     (approximate)  History   Chief Complaint: Emesis  HPI  Johnathan Harvey is a 66 y.o. male with a past medical history of esophageal strictures, gastric reflux, hypertension, migraines, presents to the emergency department for nausea and vomiting.  Patient was seen in the emergency department yesterday by myself for migraine headache and nausea.  At that time he states his big concern was more nausea than headache.  Patient states last night the headache started coming back although he took Excedrin Migraine which worked and the headache went away.  Today he went to work but states he has been nauseated and vomiting multiple times throughout the day.  Denies any significant abdominal pain.  No diarrhea.  No fever.  No constipation per patient.  No urinary symptoms.  Patient denies any headache currently  Physical Exam   Triage Vital Signs: ED Triage Vitals  Enc Vitals Group     BP 03/17/22 1608 (!) 184/109     Pulse Rate 03/17/22 1608 72     Resp 03/17/22 1608 18     Temp --      Temp src --      SpO2 03/17/22 1608 100 %     Weight --      Height --      Head Circumference --      Peak Flow --      Pain Score 03/17/22 1606 0     Pain Loc --      Pain Edu? --      Excl. in [place]? --     Most recent vital signs: Vitals:   03/17/22 1608  BP: (!) 184/109  Pulse: 72  Resp: 18  SpO2: 100%    General: Awake, no distress.  CV:  Good peripheral perfusion.  Regular rate and rhythm  Resp:  Normal effort.  Equal breath sounds bilaterally.  Abd:  No distention.  Soft, nontender.  No rebound or guarding.   ED Results / Procedures / Treatments   RADIOLOGY  I have reviewed the CT images of the head.  No acute bleed on my interpretation. Radiology is read the head CT is negative. Radiology has read the abdominal CT with no acute finding.  Mild  thickening of the distal esophagus.  Moderate volume stool consistent with constipation.  Otherwise stable findings.   MEDICATIONS ORDERED IN ED: Medications  ondansetron (ZOFRAN-ODT) disintegrating tablet 4 mg (4 mg Oral Given 03/17/22 1609)  sodium chloride 0.9 % bolus 1,000 mL (1,000 mLs Intravenous New Bag/Given 03/17/22 1622)     IMPRESSION / MDM / ASSESSMENT AND PLAN / ED COURSE  I reviewed the triage vital signs and the nursing notes.  Patient's presentation is most consistent with acute presentation with potential threat to life or bodily function.  Patient presents emergency department for continued nausea and vomiting.  Patient was seen in the emergency department yesterday by myself for nausea and headache.  Patient denies any headache currently.  Patient has a benign abdomen.  However given the patient's persistent nausea we will proceed with CT imaging today as we did not do so yesterday.  We will check labs including LFTs lipase as well as a urine sample.  We will IV hydrate treat the patient's nausea with Reglan and continue close monitor.  Patient's work-up is overall reassuring.  Labs are largely within normal  limits including normal CBC.  Normal chemistry.  Normal LFTs, normal lipase.  Mild ketones on urinalysis otherwise normal.  Patient receiving IV fluids, he is feeling better after IM compazine.  CT scan consistent with constipation otherwise negative.  Discussed findings with patient, he is reassured.  We will place patient on nausea medication as well as MiraLAX going home have the patient follow-up with his doctor.  Patient agreeable to plan of care.  FINAL CLINICAL IMPRESSION(S) / ED DIAGNOSES   Nausea and vomiting  Rx / DC Orders   zofran odt  Note:  This document was prepared using Dragon voice recognition software and may include unintentional dictation errors.   Harvest Dark, MD 03/17/22 [phone]

## 2022-03-17 NOTE — ED Triage Notes (Signed)
Pt to ED via POV from home. Pt reports N/V that started yesterday and has continued and gotten worse today. Pt was seen yesterday for the same and migraine. Pt denies migraine currently. Pt denies CP or SOB.   Pt actively vomiting in triage.

## 2022-03-17 NOTE — ED Notes (Signed)
Pt passed PO challenge. Nadn. Arm where IV was placed is less red. Pt states no pain or issues noted to arm. Pt states no more N/V for 30 min

## 2022-03-23 ENCOUNTER — Ambulatory Visit: Payer: No Typology Code available for payment source | Attending: Pain Medicine | Admitting: Pain Medicine

## 2022-03-23 ENCOUNTER — Encounter: Payer: Self-pay | Admitting: Pain Medicine

## 2022-03-23 ENCOUNTER — Ambulatory Visit
Admission: RE | Admit: 2022-03-23 | Discharge: 2022-03-23 | Disposition: A | Discharge status: Home / Self Care | Payer: No Typology Code available for payment source | Source: Ambulatory Visit | Attending: Pain Medicine | Admitting: Pain Medicine

## 2022-03-23 VITALS — BP 161/111 | HR 61 | Temp 98.4°F | Resp 18 | Ht 70.0 in | Wt 130.0 lb

## 2022-03-23 DIAGNOSIS — M7918 Myalgia, other site: Secondary | ICD-10-CM | POA: Diagnosis present

## 2022-03-23 DIAGNOSIS — M4312 Spondylolisthesis, cervical region: Secondary | ICD-10-CM | POA: Diagnosis present

## 2022-03-23 DIAGNOSIS — M4802 Spinal stenosis, cervical region: Secondary | ICD-10-CM | POA: Diagnosis present

## 2022-03-23 DIAGNOSIS — M542 Cervicalgia: Secondary | ICD-10-CM | POA: Diagnosis not present

## 2022-03-23 DIAGNOSIS — G8929 Other chronic pain: Secondary | ICD-10-CM | POA: Insufficient documentation

## 2022-03-23 DIAGNOSIS — M47812 Spondylosis without myelopathy or radiculopathy, cervical region: Secondary | ICD-10-CM | POA: Insufficient documentation

## 2022-03-23 DIAGNOSIS — M549 Dorsalgia, unspecified: Secondary | ICD-10-CM | POA: Diagnosis present

## 2022-03-23 DIAGNOSIS — M5090 Cervical disc disorder, unspecified, unspecified cervical region: Secondary | ICD-10-CM | POA: Insufficient documentation

## 2022-03-23 DIAGNOSIS — M5030 Other cervical disc degeneration, unspecified cervical region: Secondary | ICD-10-CM

## 2022-03-23 DIAGNOSIS — G4486 Cervicogenic headache: Secondary | ICD-10-CM | POA: Diagnosis present

## 2022-03-23 MED ORDER — DEXAMETHASONE SODIUM PHOSPHATE 10 MG/ML IJ SOLN
10.0000 mg | Freq: Once | INTRAMUSCULAR | Status: AC
  Administered 2022-03-23: 10 mg
  Filled 2022-03-23: qty 1

## 2022-03-23 MED ORDER — ROPIVACAINE HCL 2 MG/ML IJ SOLN
1.0000 mL | Freq: Once | INTRAMUSCULAR | Status: AC
  Administered 2022-03-23: 1 mL via EPIDURAL
  Filled 2022-03-23: qty 20

## 2022-03-23 MED ORDER — DIAZEPAM 5 MG PO TABS
ORAL_TABLET | ORAL | Status: AC
  Filled 2022-03-23: qty 2

## 2022-03-23 MED ORDER — SODIUM CHLORIDE 0.9% FLUSH
1.0000 mL | Freq: Once | INTRAVENOUS | Status: AC
  Administered 2022-03-23: 1 mL

## 2022-03-23 MED ORDER — PENTAFLUOROPROP-TETRAFLUOROETH EX AERO
INHALATION_SPRAY | Freq: Once | CUTANEOUS | Status: AC
  Administered 2022-03-23: 30 via TOPICAL

## 2022-03-23 MED ORDER — DIAZEPAM 5 MG PO TABS
10.0000 mg | ORAL_TABLET | Freq: Once | ORAL | Status: AC
  Administered 2022-03-23: 10 mg via ORAL

## 2022-03-23 MED ORDER — LIDOCAINE HCL 2 % IJ SOLN
20.0000 mL | Freq: Once | INTRAMUSCULAR | Status: AC
  Administered 2022-03-23: 400 mg
  Filled 2022-03-23: qty 40

## 2022-03-23 MED ORDER — IOHEXOL 180 MG/ML  SOLN
10.0000 mL | Freq: Once | INTRAMUSCULAR | Status: AC
  Administered 2022-03-23: 5 mL via EPIDURAL
  Filled 2022-03-23: qty 20

## 2022-03-23 MED ORDER — LACTATED RINGERS IV SOLN: Freq: Once | INTRAVENOUS | Status: DC

## 2022-03-23 MED ORDER — MIDAZOLAM HCL 5 MG/5ML IJ SOLN: 0.5000 mg | Freq: Once | INTRAMUSCULAR | Status: DC

## 2022-03-23 NOTE — Patient Instructions (Addendum)
____________________________________________________________________________________________  Post-Procedure Discharge Instructions  Instructions: Apply ice:  Purpose: This will minimize any swelling and discomfort after procedure.  When: Day of procedure, as soon as you get home. How: Fill a plastic sandwich bag with crushed ice. Cover it with a small towel and apply to injection site. How long: (15 min on, 15 min off) Apply for 15 minutes then remove x 15 minutes.  Repeat sequence on day of procedure, until you go to bed. Apply heat:  Purpose: To treat any soreness and discomfort from the procedure. When: Starting the next day after the procedure. How: Apply heat to procedure site starting the day following the procedure. How long: May continue to repeat daily, until discomfort goes away. Food intake: Start with clear liquids (like water) and advance to regular food, as tolerated.  Physical activities: Keep activities to a minimum for the first 8 hours after the procedure. After that, then as tolerated. Driving: If you have received any sedation, be responsible and do not drive. You are not allowed to drive for 24 hours after having sedation. Blood thinner: (Applies only to those taking blood thinners) You may restart your blood thinner 6 hours after your procedure. Insulin: (Applies only to Diabetic patients taking insulin) As soon as you can eat, you may resume your normal dosing schedule. Infection prevention: Keep procedure site clean and dry. Shower daily and clean area with soap and water. Post-procedure Pain Diary: Extremely important that this be done correctly and accurately. Recorded information will be used to determine the next step in treatment. For the purpose of accuracy, follow these rules: Evaluate only the area treated. Do not report or include pain from an untreated area. For the purpose of this evaluation, ignore all other areas of pain, except for the treated area. After  your procedure, avoid taking a long nap and attempting to complete the pain diary after you wake up. Instead, set your alarm clock to go off every hour, on the hour, for the initial 8 hours after the procedure. Document the duration of the numbing medicine, and the relief you are getting from it. Do not go to sleep and attempt to complete it later. It will not be accurate. If you received sedation, it is likely that you were given a medication that may cause amnesia. Because of this, completing the diary at a later time may cause the information to be inaccurate. This information is needed to plan your care. Follow-up appointment: Keep your post-procedure follow-up evaluation appointment after the procedure (usually 2 weeks for most procedures, 6 weeks for radiofrequencies). DO NOT FORGET to bring you pain diary with you.   Expect: (What should I expect to see with my procedure?) From numbing medicine (AKA: Local Anesthetics): Numbness or decrease in pain. You may also experience some weakness, which if present, could last for the duration of the local anesthetic. Onset: Full effect within 15 minutes of injected. Duration: It will depend on the type of local anesthetic used. On the average, 1 to 8 hours.  From steroids (Applies only if steroids were used): Decrease in swelling or inflammation. Once inflammation is improved, relief of the pain will follow. Onset of benefits: Depends on the amount of swelling present. The more swelling, the longer it will take for the benefits to be seen. In some cases, up to 10 days. Duration: Steroids will stay in the system x 2 weeks. Duration of benefits will depend on multiple posibilities including persistent irritating factors. Side-effects: If present, they  may typically last 2 weeks (the duration of the steroids). Frequent: Cramps (if they occur, drink Gatorade and take over-the-counter Magnesium 450-500 mg once to twice a day); water retention with temporary  weight gain; increases in blood sugar; decreased immune system response; increased appetite. Occasional: Facial flushing (red, warm cheeks); mood swings; menstrual changes. Uncommon: Long-term decrease or suppression of natural hormones; bone thinning. (These are more common with higher doses or more frequent use. This is why we prefer that our patients avoid having any injection therapies in other practices.)  Very Rare: Severe mood changes; psychosis; aseptic necrosis. From procedure: Some discomfort is to be expected once the numbing medicine wears off. This should be minimal if ice and heat are applied as instructed.  Call if: (When should I call?) You experience numbness and weakness that gets worse with time, as opposed to wearing off. New onset bowel or bladder incontinence. (Applies only to procedures done in the spine)  Emergency Numbers: Durning business hours (Monday - Thursday, 8:00 AM - 4:00 PM) (Friday, 9:00 AM - 12:00 Noon): [phone] After hours: [phone] NOTE: If you are having a problem and are unable connect with, or to talk to a provider, then go to your nearest urgent care or emergency department. If the problem is serious and urgent, please call 911. ____________________________________________________________________________________________  Pain Management Discharge Instructions  General Discharge Instructions :  If you need to reach your doctor call: Monday-Friday 8:00 am - 4:00 pm at [phone] or toll free [phone].  After clinic hours [phone] to have operator reach doctor.  Bring all of your medication bottles to all your appointments in the pain clinic.  To cancel or reschedule your appointment with Pain Management please remember to call 24 hours in advance to avoid a fee.  Refer to the educational materials which you have been given on: General Risks, I had my Procedure. Discharge Instructions, Post Sedation.  Post Procedure  Instructions:  The drugs you were given will stay in your system until tomorrow, so for the next 24 hours you should not drive, make any legal decisions or drink any alcoholic beverages.  You may eat anything you prefer, but it is better to start with liquids then soups and crackers, and gradually work up to solid foods.  Please notify your doctor immediately if you have any unusual bleeding, trouble breathing or pain that is not related to your normal pain.  Depending on the type of procedure that was done, some parts of your body may feel week and/or numb.  This usually clears up by tonight or the next day.  Walk with the use of an assistive device or accompanied by an adult for the 24 hours.  You may use ice on the affected area for the first 24 hours.  Put ice in a Ziploc bag and cover with a towel and place against area 15 minutes on 15 minutes off.  You may switch to heat after 24 hours.Epidural Steroid Injection Patient Information  Description: The epidural space surrounds the nerves as they exit the spinal cord.  In some patients, the nerves can be compressed and inflamed by a bulging disc or a tight spinal canal (spinal stenosis).  By injecting steroids into the epidural space, we can bring irritated nerves into direct contact with a potentially helpful medication.  These steroids act directly on the irritated nerves and can reduce swelling and inflammation which often leads to decreased pain.  Epidural steroids may be injected anywhere along the  spine and from the neck to the low back depending upon the location of your pain.   After numbing the skin with local anesthetic (like Novocaine), a small needle is passed into the epidural space slowly.  You may experience a sensation of pressure while this is being done.  The entire block usually last less than 10 minutes.  Conditions which may be treated by epidural steroids:  Low back and leg pain Neck and arm pain Spinal  stenosis Post-laminectomy syndrome Herpes zoster (shingles) pain Pain from compression fractures  Preparation for the injection:  Do not eat any solid food or dairy products within 8 hours of your appointment.  You may drink clear liquids up to 3 hours before appointment.  Clear liquids include water, black coffee, juice or soda.  No milk or cream please. You may take your regular medication, including pain medications, with a sip of water before your appointment  Diabetics should hold regular insulin (if taken separately) and take 1/2 normal NPH dos the morning of the procedure.  Carry some sugar containing items with you to your appointment. A driver must accompany you and be prepared to drive you home after your procedure.  Bring all your current medications with your. An IV may be inserted and sedation may be given at the discretion of the physician.   A blood pressure cuff, EKG and other monitors will often be applied during the procedure.  Some patients may need to have extra oxygen administered for a short period. You will be asked to provide medical information, including your allergies, prior to the procedure.  We must know immediately if you are taking blood thinners (like Coumadin/Warfarin)  Or if you are allergic to IV iodine contrast (dye). We must know if you could possible be pregnant.  Possible side-effects: Bleeding from needle site Infection (rare, may require surgery) Nerve injury (rare) Numbness & tingling (temporary) Difficulty urinating (rare, temporary) Spinal headache ( a headache worse with upright posture) Light -headedness (temporary) Pain at injection site (several days) Decreased blood pressure (temporary) Weakness in arm/leg (temporary) Pressure sensation in back/neck (temporary)  Call if you experience: Fever/chills associated with headache or increased back/neck pain. Headache worsened by an upright position. New onset weakness or numbness of an  extremity below the injection site Hives or difficulty breathing (go to the emergency room) Inflammation or drainage at the infection site Severe back/neck pain Any new symptoms which are concerning to you  Please note:  Although the local anesthetic injected can often make your back or neck feel good for several hours after the injection, the pain will likely return.  It takes 3-7 days for steroids to work in the epidural space.  You may not notice any pain relief for at least that one week.  If effective, we will often do a series of three injections spaced 3-6 weeks apart to maximally decrease your pain.  After the initial series, we generally will wait several months before considering a repeat injection of the same type.  If you have any questions, please call [phone] [facility] 

## 2022-03-23 NOTE — Progress Notes (Signed)
PROVIDER NOTE: Interpretation of information contained herein should be left to medically-trained personnel. Specific patient instructions are provided elsewhere under "Patient Instructions" section of medical record. This document was created in part using STT-dictation technology, any transcriptional errors that may result from this process are unintentional.  Patient: Johnathan Harvey Type: Established DOB: May 18, 1956 MRN: [id number] PCP: Johnathan Aus, MD  Service: Procedure DOS: 03/23/2022 Setting: Ambulatory Location: Ambulatory outpatient facility Delivery: Face-to-face Provider: Gaspar Cola, MD Specialty: Interventional Pain Management Specialty designation: 09 Location: Outpatient facility Ref. Prov.: Johnathan Aus, MD   Procedure [facility] Interventional Pain Management )   Type: Cervical Epidural Steroid injection (ESI) (Interlaminar) #1  Laterality: Right  Level: C7-T1 Imaging: Fluoroscopy-assisted DOS: 03/23/2022  Performed by: Milinda Pointer, MD Anesthesia: Local anesthesia (1-2% Lidocaine) Anxiolysis: Oral Valium 10 mg Sedation: None.   Purpose: Diagnostic/Therapeutic Indications: Cervicalgia, cervical radicular pain, degenerative disc disease, severe enough to impact quality of life or function. 1. Cervicalgia (1ry area of Pain) (Right)   2. DDD (degenerative disc disease), cervical   3. Cervicogenic headache (2ry area of Pain) (Right)   4. Cervical disc disease   5. Cervical facet arthropathy (Multilevel) (Bilateral)   6. Cervical facet syndrome   7. Cervical myofascial pain syndrome   8. Cervical neck pain with evidence of disc disease   9. Chronic neck and back pain   10. Cervical facet hypertrophy (Multilevel) (Bilateral)   11. 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 Cervical foraminal stenosis (Bilateral: C6-7) (Right: C4-5) (Left: C5-6)    NAS-11 score:   Pre-procedure: 10-Worst pain ever/10   Post-procedure: 10-Worst pain  ever/10   (02/15/2022) CERVICAL X-RAY FINDINGS: Frontal, bilateral oblique, lateral neutral, lateral flexion, and lateral extension views of the cervical spine are obtained. There is minimal degenerative anterolisthesis of C3 on C4, measuring approximately 2 mm. No instability with flexion or extension. There is moderate spondylosis at C4-5, C5-6, and C6-7. Multilevel facet hypertrophy greatest at C2-3, C3-4, and C4-5. There is right predominant neural foraminal narrowing at C4-5, left predominant narrowing at C5-6, and symmetrical narrowing at C6-7. Prevertebral soft tissues are unremarkable. Lung apices are clear.   IMPRESSION: 1. Extensive multilevel cervical spondylosis and facet hypertrophy as above, with neural foraminal encroachment from C4-5 through C6-7. 2. Mild degenerative anterolisthesis of C3 on C4, measuring approximately 2 mm.    Pre-Procedure Preparation  Monitoring: As per clinic protocol. Respiration, ETCO2, SpO2, BP, heart rate and rhythm monitor placed and checked for adequate function  Risk Assessment: Vitals:  IRS:WNIOEVOJJ body mass index is 18.65 kg/m as calculated from the following:   Height as of this encounter: '5\' 10"'$  (1.778 m).   Weight as of this encounter: 130 lb (59 kg)., Rate:61ECG Heart Rate: 64, BP:(!) 183/103, Resp:16, Temp:98.4 F (36.9 C), SpO2:100 %  Allergies: He is allergic to protonix [pantoprazole].  Precautions: None required  Blood-thinner(s): None at this time  Coagulopathies: Reviewed. None identified.   Active Infection(s): Reviewed. None identified. [name] is afebrile   Location setting: Procedure suite Position: Prone, on modified reverse trendelenburg to facilitate breathing, with head in head-cradle. Pillows positioned under chest (below chin-level) with cervical spine flexed. Safety Precautions: Patient was assessed for positional comfort and pressure points before starting the procedure. Prepping solution: DuraPrep (Iodine  Povacrylex [0.7% available iodine] and Isopropyl Alcohol, 74% w/w) Prep Area: Entire  cervicothoracic region Approach: percutaneous, paramedial Intended target: Posterior cervical epidural space Materials: Tray: Epidural Needle(s): Epidural (Tuohy) Qty: 1 Length: (24m)  3.5-inch Gauge: 17G   Meds ordered this encounter  Medications   iohexol (OMNIPAQUE) 180 MG/ML injection 10 mL    Must be Myelogram-compatible. If not available, you may substitute with a water-soluble, non-ionic, hypoallergenic, myelogram-compatible radiological contrast medium.   lidocaine (XYLOCAINE) 2 % (with pres) injection 400 mg   pentafluoroprop-tetrafluoroeth (GEBAUERS) aerosol   DISCONTD: lactated ringers infusion   DISCONTD: midazolam (VERSED) 5 MG/5ML injection 0.5-2 mg    Make sure Flumazenil is available in the pyxis when using this medication. If oversedation occurs, administer 0.2 mg IV over 15 sec. If after 45 sec no response, administer 0.2 mg again over 1 min; may repeat at 1 min intervals; not to exceed 4 doses (1 mg)   sodium chloride flush (NS) 0.9 % injection 1 mL   ropivacaine (PF) 2 mg/mL (0.2%) (NAROPIN) injection 1 mL   dexamethasone (DECADRON) injection 10 mg   diazepam (VALIUM) tablet 10 mg    Make sure Flumazenil is available in the pyxis when using this medication. If oversedation occurs, administer 0.2 mg IV over 15 sec. If after 45 sec no response, administer 0.2 mg again over 1 min; may repeat at 1 min intervals; not to exceed 4 doses (1 mg)    Orders Placed This Encounter  Procedures   Cervical Epidural Injection    Indication(s): Radiculitis and cervicalgia associated with cervical degenerative disc disease. Position: Prone Imaging guidance: Fluoroscopy required. Contrast required unless contraindicated by allergy or severe CKD. Equipment & Materials: Epidural tray & needle.    Scheduling Instructions:     Procedure: Cervical Epidural Steroid Injection/Block     Planned Level(s):  C7-T1     Laterality: Right-sided     Anxiolysis: Patient's choice.     Timeframe: Today    Order Specific Question:   Where will this procedure be performed?    Answer:   [facility]    Comments:   by Dr. Fortunato Curling [facility] C-ARM 1-60 MIN NO REPORT    Intraoperative interpretation by procedural physician at [facility].    Standing Status:   Standing    Number of Occurrences:   1    Order Specific Question:   Reason for exam:    Answer:   Assistance in needle guidance and placement for procedures requiring needle placement in or near specific anatomical locations not easily accessible without such assistance.   Informed Consent Details: Physician/Practitioner Attestation; Transcribe to consent form and obtain patient signature    Nursing instructions: Transcribe to consent form and obtain patient signature. Always confirm laterality of pain with Mr. Stamos, before procedure.    Order Specific Question:   Physician/Practitioner attestation of informed consent for procedure/surgical case    Answer:   I, the physician/practitioner, attest that I have discussed with the patient the benefits, risks, side effects, alternatives, likelihood of achieving goals and potential problems during recovery for the procedure that I have provided informed consent.    Order Specific Question:   Procedure    Answer:   Cervical Epidural Steroid Injection (CESI) under fluoroscopic guidance    Order Specific Question:   Physician/Practitioner performing the procedure    Answer:   Patirica Longshore A. Dossie Arbour MD    Order Specific Question:   Indication/Reason    Answer:   Indications: Cervicalgia (neck pain), cervical radicular pain, radiculitis (arm/shoulder pain, numbness, and/or weakness), degenerative disc disease, severe enough to greatly impact quality of life or function.   Provide equipment / supplies at  bedside    "Epidural Tray" (Disposable  single use) Catheter: NOT required     Standing Status:   Standing    Number of Occurrences:   1    Order Specific Question:   Specify    Answer:   Epidural Tray     Time-out: 3664 I initiated and conducted the "Time-out" before starting the procedure, as per protocol. The patient was asked to participate by confirming the accuracy of the "Time Out" information. Verification of the correct person, site, and procedure were performed and confirmed by me, the nursing staff, and the patient. "Time-out" conducted as per Joint Commission's Universal Protocol (UP.01.01.01). Procedure checklist: Completed   H&P (Pre-op  Assessment)  Mr. Yun is a 66 y.o. (year old), male patient, seen today for interventional treatment. He  has a past surgical history that includes Ankle arthroplasty; Esophagogastroduodenoscopy (N/A, 11/04/2018); and Inguinal hernia repair (Left, 09/26/2019). Mr. Bochenek has a current medication list which includes the following prescription(s): amitriptyline, diclofenac, fluorouracil, gabapentin, and ondansetron. His primarily concern today is the Neck Pain (base)  He is allergic to protonix [pantoprazole].   Last encounter: My last encounter with him was on 02/15/2022. Pertinent problems: Mr. Mukai has Arthritis; Cervical disc disease; Cervical myofascial pain syndrome; Chronic tension-type headache, intractable; Gout; Headache disorder; Cervicalgia (1ry area of Pain) (Right); Septic prepatellar bursitis of knee (Left); Abnormal MRI, cervical spine (09/28/2021); Chronic pain syndrome; Grade 1 Anterolisthesis of cervical spine (C3/C4 & C7/T1); Cervical facet arthropathy (Multilevel) (Bilateral); DDD (degenerative disc disease), cervical; Cervical foraminal stenosis (Bilateral: C6-7) (Right: C4-5) (Left: C5-6); Cervicogenic headache (2ry area of Pain) (Right); Cervical facet syndrome; Spondylosis without myelopathy or radiculopathy, cervical region; Occipital headache (Right); Neck pain of over 3 months duration; Cervical  neck pain with evidence of disc disease; Chronic neck and back pain; Musculoskeletal disorder involving upper trapezius muscle (Right); and Cervical facet hypertrophy (Multilevel) (Bilateral) on their pertinent problem list. Pain Assessment: Severity of Chronic pain is reported as a 10-Worst pain ever/10. Location: Neck  /inside of head. Onset: More than a month ago. Quality: Aching, Constant, Nagging, Discomfort (migraines). Timing: Constant. Modifying factor(s): rest, lights off, trips to ED for migraines. Vitals:  height is '5\' 10"'$  (1.778 m) and weight is 130 lb (59 kg). His temperature is 98.4 F (36.9 C). His blood pressure is 161/111 (abnormal) and his pulse is 61. His respiration is 18 and oxygen saturation is 100%.   Reason for encounter: Interventional pain management therapy due pain of at least four (4) weeks in duration, with to failure to respond to and/or inability to tolerate more conservative care.   Site Confirmation: Mr. Calabretta was asked to confirm the procedure and laterality before marking the site.  Consent: Before the procedure and under the influence of no sedative(s), amnesic(s), or anxiolytics, the patient was informed of the treatment options, risks and possible complications. To fulfill our ethical and legal obligations, as recommended by the American Medical Association's Code of Ethics, I have informed the patient of my clinical impression; the nature and purpose of the treatment or procedure; the risks, benefits, and possible complications of the intervention; the alternatives, including doing nothing; the risk(s) and benefit(s) of the alternative treatment(s) or procedure(s); and the risk(s) and benefit(s) of doing nothing. The patient was provided information about the general risks and possible complications associated with the procedure. These may include, but are not limited to: failure to achieve desired goals, infection, bleeding, organ or nerve damage, allergic  reactions, paralysis, and death. In  addition, the patient was informed of those risks and complications associated to Spine-related procedures, such as failure to decrease pain; infection (i.e.: Meningitis, epidural or intraspinal abscess); bleeding (i.e.: epidural hematoma, subarachnoid hemorrhage, or any other type of intraspinal or peri-dural bleeding); organ or nerve damage (i.e.: Any type of peripheral nerve, nerve root, or spinal cord injury) with subsequent damage to sensory, motor, and/or autonomic systems, resulting in permanent pain, numbness, and/or weakness of one or several areas of the body; allergic reactions; (i.e.: anaphylactic reaction); and/or death. Furthermore, the patient was informed of those risks and complications associated with the medications. These include, but are not limited to: allergic reactions (i.e.: anaphylactic or anaphylactoid reaction(s)); adrenal axis suppression; blood sugar elevation that in diabetics may result in ketoacidosis or comma; water retention that in patients with history of congestive heart failure may result in shortness of breath, pulmonary edema, and decompensation with resultant heart failure; weight gain; swelling or edema; medication-induced neural toxicity; particulate matter embolism and blood vessel occlusion with resultant organ, and/or nervous system infarction; and/or aseptic necrosis of one or more joints. Finally, the patient was informed that Medicine is not an exact science; therefore, there is also the possibility of unforeseen or unpredictable risks and/or possible complications that may result in a catastrophic outcome. The patient indicated having understood very clearly. We have given the patient no guarantees and we have made no promises. Enough time was given to the patient to ask questions, all of which were answered to the patient's satisfaction. Mr. Aultman has indicated that he wanted to continue with the procedure. Attestation:  I, the ordering provider, attest that I have discussed with the patient the benefits, risks, side-effects, alternatives, likelihood of achieving goals, and potential problems during recovery for the procedure that I have provided informed consent.  Date  Time: 03/23/2022  9:27 AM   Prophylactic antibiotics  Anti-infectives (From admission, onward)    None      Indication(s): None identified   Description of procedure   Start Time: 1115 hrs  Local Anesthesia: Once the patient was positioned, prepped, and time-out was completed. The target area was identified located. The skin was marked with an approved surgical skin marker. Once marked, the skin (epidermis, dermis, and hypodermis), and deeper tissues (fat, connective tissue and muscle) were infiltrated with a small amount of a short-acting local anesthetic, loaded on a 10cc syringe with a 25G, 1.5-in  Needle. An appropriate amount of time was allowed for local anesthetics to take effect before proceeding to the next step. Local Anesthetic: Lidocaine 1-2% The unused portion of the local anesthetic was discarded in the proper designated containers. Safety Precautions: Aspiration looking for blood return was conducted prior to all injections. At no point did I inject any substances, as a needle was being advanced. Before injecting, the patient was told to immediately notify me if he was experiencing any new onset of "ringing in the ears, or metallic taste in the mouth". No attempts were made at seeking any paresthesias. Safe injection practices and needle disposal techniques used. Medications properly checked for expiration dates. SDV (single dose vial) medications used. After the completion of the procedure, all disposable equipment used was discarded in the proper designated medical waste containers.  Technical description: Protocol guidelines were followed. Using fluoroscopic guidance, the epidural needle was introduced through the skin,  ipsilateral to the reported pain, and advanced to the target area. Posterior laminar os was contacted and the needle walked caudad, until the lamina was cleared. The  ligamentum flavum was engaged and the epidural space identified using "loss-of-resistance technique" with 2-3 ml of PF-NaCl (0.9% NSS), in a 5cc dedicated LOR syringe. See "Imaging guidance" below for use of contrast details.  Injection: Once satisfactory needle placement was confirmed, I proceeded to inject the desired solution in slow, incremental fashion, intermittently assessing for discomfort or any signs of abnormal or undesired spread of substance. Once completed, the needle was removed and disposed of, as per hospital protocols.   Vitals:   03/23/22 0926 03/23/22 1115 03/23/22 1120  BP: (!) 183/103 (!) 171/100 (!) 161/111  Pulse: 61    Resp: '16 18 18  '$ Temp: 98.4 F (36.9 C)    SpO2: 100% 100% 100%  Weight: 130 lb (59 kg)    Height: '5\' 10"'$  (1.778 m)      End Time: 1120 hrs  Once the entire procedure was completed, the treated area was cleaned, making sure to leave some of the prepping solution back to take advantage of its long term bactericidal properties.   Imaging guidance  Type of Imaging Technique: Fluoroscopy Guidance (Spinal) Indication(s): Assistance in needle guidance and placement for procedures requiring needle placement in or near specific anatomical locations not easily accessible without such assistance. Exposure Time: Please see nurses notes for exact fluoroscopy time. Contrast: Before injecting any contrast, we confirmed that the patient did not have an allergy to iodine, shellfish, or radiological contrast. Once satisfactory needle placement was completed, radiological contrast was injected under continuous fluoroscopic guidance. Injection of contrast accomplished without complications. See chart for type and volume of contrast used. Fluoroscopic Guidance: I was personally present in the fluoroscopy  suite, where the patient was placed in position for the procedure, over the fluoroscopy-compatible table. Fluoroscopy was manipulated, using "Tunnel Vision Technique", to obtain the best possible view of the target area, on the affected side. Parallax error was corrected before commencing the procedure. A "direction-depth-direction" technique was used to introduce the needle under continuous pulsed fluoroscopic guidance. Once the target was reached, antero-posterior, oblique, and lateral fluoroscopic projection views were taken to confirm needle placement in all planes. Electronic images uploaded into EMR.  Interpretation: Successful epidural injection. Intraoperative imaging interpretation by performing Physician.    Post-op assessment  Post-procedure Vital Signs:  Pulse/HCG Rate: 61(!) 58 Temp: 98.4 F (36.9 C) Resp: 18 BP: (!) 161/111 SpO2: 100 %  EBL: None  Complications: No immediate post-treatment complications observed by team, or reported by patient.  Note: The patient tolerated the entire procedure well. A repeat set of vitals were taken after the procedure and the patient was kept under observation following institutional policy, for this type of procedure. Post-procedural neurological assessment was performed, showing return to baseline, prior to discharge. The patient was provided with post-procedure discharge instructions, including a section on how to identify potential problems. Should any problems arise concerning this procedure, the patient was given instructions to immediately contact us, at any time, without hesitation. In any case, we plan to contact the patient by telephone for a follow-up status report regarding this interventional procedure.  Comments:  No additional relevant information.   Plan of care  Chronic Opioid Analgesic:  None MME/day: 0 mg/day   Medications administered: We administered iohexol, lidocaine, pentafluoroprop-tetrafluoroeth, sodium chloride  flush, ropivacaine (PF) 2 mg/mL (0.2%), dexamethasone, and diazepam.  Follow-up plan:   Return in about 2 weeks (around 04/06/2022) for Proc-day (T,Th), (F2F), (PPE).      Interventional Therapies  Risk  Complexity Considerations:   Estimated body mass  index is 18.65 kg/m as calculated from the following:   Height as of this encounter: '5\' 10"'$  (1.778 m).   Weight as of this encounter: 130 lb (59 kg). WNL   Planned  Pending:   Diagnostic right Cervical ESI #1    Under consideration:   Diagnostic right Cervical ESI #1    Completed:   None at this time   Completed by other providers:   Therapeutic right C3 and C4 medial branch RFA x1 (07/30/2020) by Sharlet Salina, DO [facility])  Diagnostic right C3 and C4 MBB x2 (06/30/2020, 07/15/2020) by Sharlet Salina, DO [facility] PMR)  Diagnostic right C3-4 zygapophyseal joint (facet joint) inj. x1 (03/05/2020) by Sharlet Salina, DO   Therapeutic  Palliative (PRN) options:   None established     Recent Visits Date Type Provider Dept  02/15/22 Office Visit Milinda Pointer, MD [facility]  Showing recent visits within past 90 days and meeting all other requirements Today's Visits Date Type Provider Dept  03/23/22 Procedure visit Milinda Pointer, MD [facility]  Showing today's visits and meeting all other requirements Future Appointments Date Type Provider Dept  04/11/22 Appointment Milinda Pointer, MD [facility]  Showing future appointments within next 90 days and meeting all other requirements   Disposition: Discharge home  Discharge (Date  Time): 03/23/2022; 1130 hrs.   Primary Care Physician: Johnathan Aus, MD Location: [facility] Note by: Gaspar Cola, MD Date: 03/23/2022; Time: 11:35 AM  DISCLAIMER: Medicine is not an Chief Strategy Officer. It has no guarantees or warranties. The decision to proceed with this intervention was based on the information collected  from the patient. Conclusions were drawn from the patient's questionnaire, interview, and examination. Because information was provided in large part by the patient, it cannot be guaranteed that it has not been purposely or unconsciously manipulated or altered. Every effort has been made to obtain as much accurate, relevant, available data as possible. Always take into account that the treatment will also be dependent on availability of resources and existing treatment guidelines, considered by other Pain Management Specialists as being common knowledge and practice, at the time of the intervention. It is also important to point out that variation in procedural techniques and pharmacological choices are the acceptable norm. For Medico-Legal review purposes, the indications, contraindications, technique, and results of the these procedures should only be evaluated, judged and interpreted by a Board-Certified Interventional Pain Specialist with extensive familiarity and expertise in the same exact procedure and technique.

## 2022-03-23 NOTE — Progress Notes (Signed)
Safety precautions to be maintained throughout the outpatient stay will include: orient to surroundings, keep bed in low position, maintain call bell within reach at all times, provide assistance with transfer out of bed and ambulation.  

## 2022-03-24 ENCOUNTER — Telehealth: Payer: Self-pay | Admitting: *Deleted

## 2022-03-24 NOTE — Telephone Encounter (Signed)
Post procedure call;  patient reports that he continues to have a headache, this procedure was to treat migraines,.  Rational explained about procedure.  Otherwise patient is doing well.

## 2022-04-04 ENCOUNTER — Other Ambulatory Visit: Payer: Self-pay

## 2022-04-04 ENCOUNTER — Emergency Department
Admission: EM | Admit: 2022-04-04 | Discharge: 2022-04-04 | Disposition: A | Discharge status: Home / Self Care | Payer: No Typology Code available for payment source | Attending: Emergency Medicine | Admitting: Emergency Medicine

## 2022-04-04 DIAGNOSIS — Z85828 Personal history of other malignant neoplasm of skin: Secondary | ICD-10-CM | POA: Insufficient documentation

## 2022-04-04 DIAGNOSIS — G43809 Other migraine, not intractable, without status migrainosus: Secondary | ICD-10-CM | POA: Diagnosis not present

## 2022-04-04 DIAGNOSIS — G43909 Migraine, unspecified, not intractable, without status migrainosus: Secondary | ICD-10-CM | POA: Diagnosis present

## 2022-04-04 DIAGNOSIS — I10 Essential (primary) hypertension: Secondary | ICD-10-CM | POA: Diagnosis not present

## 2022-04-04 MED ORDER — ONDANSETRON HCL 4 MG PO TABS
4.0000 mg | ORAL_TABLET | Freq: Once | ORAL | Status: AC
  Administered 2022-04-04: 4 mg via ORAL
  Filled 2022-04-04: qty 1

## 2022-04-04 MED ORDER — DEXAMETHASONE SODIUM PHOSPHATE 10 MG/ML IJ SOLN
10.0000 mg | Freq: Once | INTRAMUSCULAR | Status: AC
  Administered 2022-04-04: 10 mg via INTRAVENOUS
  Filled 2022-04-04: qty 1

## 2022-04-04 MED ORDER — DIPHENHYDRAMINE HCL 50 MG/ML IJ SOLN
50.0000 mg | Freq: Once | INTRAMUSCULAR | Status: AC
  Administered 2022-04-04: 50 mg via INTRAVENOUS
  Filled 2022-04-04: qty 1

## 2022-04-04 MED ORDER — ONDANSETRON HCL 4 MG/2ML IJ SOLN
4.0000 mg | Freq: Once | INTRAMUSCULAR | Status: AC
  Administered 2022-04-04: 4 mg via INTRAVENOUS
  Filled 2022-04-04: qty 2

## 2022-04-04 MED ORDER — KETOROLAC TROMETHAMINE 30 MG/ML IJ SOLN
30.0000 mg | Freq: Once | INTRAMUSCULAR | Status: AC
  Administered 2022-04-04: 30 mg via INTRAVENOUS
  Filled 2022-04-04: qty 1

## 2022-04-04 MED ORDER — SODIUM CHLORIDE 0.9 % IV BOLUS
1000.0000 mL | Freq: Once | INTRAVENOUS | Status: AC
Start: 2022-04-04 — End: 2022-04-04
  Administered 2022-04-04: 1000 mL via INTRAVENOUS

## 2022-04-04 MED ORDER — METOCLOPRAMIDE HCL 5 MG/ML IJ SOLN
10.0000 mg | Freq: Once | INTRAMUSCULAR | Status: AC
  Administered 2022-04-04: 10 mg via INTRAVENOUS
  Filled 2022-04-04: qty 2

## 2022-04-04 NOTE — ED Provider Notes (Signed)
[facility] Provider Note    Event Date/Time   First MD Initiated Contact with Patient 04/04/22 [phone]     (approximate)   History   Migraine   HPI  Johnathan Harvey is a 66 y.o. male who presents to the ED from home with a chief complaint of migraine headache.  Patient has a history of migraine headaches for which he sees neurology is on gabapentin for chronic neck pain thought to be causing his headaches.  States he got a injection 1 week ago that has not alleviated his headache.  Endorses photophobia, nausea and vomiting.  Reports typical migraine starting in the nape of his neck and radiating into his head globally.  Denies fever, chills, cough, chest pain, shortness of breath, abdominal pain, dizziness.     Past Medical History   Past Medical History:  Diagnosis Date   Arthritis    Esophageal stricture    GERD (gastroesophageal reflux disease)    History of MRSA infection 2016   knee wound   Hypertension    was put on medications in [place], stopped shortly after, no longer needs meds   Migraine    Skin cancer, basal cell      Active Problem List   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) (Right: C4-5) (Left: C5-6) 02/15/2022   Cervicogenic headache (2ry area of Pain) (Right) 02/15/2022   Cervical facet syndrome 02/15/2022   Spondylosis without myelopathy or radiculopathy, cervical region 02/15/2022   History of abnormal drug screen 02/15/2022   Occipital headache (Right) 02/15/2022   Neck pain of over 3 months duration 02/15/2022   Cervical neck pain with evidence of disc disease 02/15/2022    Chronic neck and back pain 02/15/2022   Musculoskeletal disorder involving upper trapezius muscle (Right) 02/15/2022   Gout 02/14/2022   History of marijuana use 02/14/2022   Abnormal MRI, cervical spine (09/28/2021) 02/14/2022   Chronic pain syndrome 02/14/2022   Pharmacologic therapy 02/14/2022   Disorder of skeletal system 02/14/2022   Problems influencing health status 02/14/2022   Basal cell carcinoma 02/07/2022   Changing skin lesion 11/25/2021   Cervical disc disease 11/24/2021   History of hepatitis C 11/24/2021   Medicare annual wellness visit, initial 11/24/2021   Emesis 07/02/2021   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   Cervical myofascial pain syndrome 03/30/2021   Chronic tension-type headache, intractable 03/30/2021   Septic prepatellar bursitis of knee (Left) 09/07/2020   Headache disorder 09/21/2019   Cervicalgia (1ry area of Pain) (Right) 09/21/2019   History of narcotic abuse ([place]) 08/01/2016    Class: History of   Chest pain 07/29/2016   Cellulitis and abscess of leg 07/28/2015   GERD (gastroesophageal reflux disease) 07/28/2015   Arthritis 07/28/2015   Gastroesophageal reflux disease without esophagitis 07/28/2015   History of opiate overdose ([place]) 05/02/2015    Class: History of   Intestinal metaplasia of gastric mucosa 12/25/2014   Hypertension 07/30/2013   Dysphagia 07/18/2013   Esophageal stricture 07/18/2013     Past Surgical History   Past Surgical History:  Procedure Laterality Date   ANKLE ARTHROPLASTY     ESOPHAGOGASTRODUODENOSCOPY N/A 11/04/2018   Procedure: ESOPHAGOGASTRODUODENOSCOPY (EGD);  Surgeon: Marius Ditch,  Tally Due, MD;  Location: [place];  Service: Gastroenterology;  Laterality: N/A;   INGUINAL HERNIA REPAIR Left 09/26/2019   Procedure: LEFT INGUINAL HERNIA REPAIR;  Surgeon: Johnathan Hausen, MD;  Location: [place];  Service: General;  Laterality: Left;     Home Medications   Prior to Admission  medications   Medication Sig Start Date End Date Taking? Authorizing Provider  amitriptyline (ELAVIL) 10 MG tablet Take 2 tablets by mouth at bedtime. 02/01/22 02/01/23  [provider]  diclofenac (VOLTAREN) 50 MG EC tablet Take 1 tablet by mouth daily. 02/01/22 02/01/23  [provider]  gabapentin (NEURONTIN) 300 MG capsule Take 300 mg by mouth 3 (three) times daily. 12/13/21   [provider]  ondansetron (ZOFRAN-ODT) 4 MG disintegrating tablet Take 1 tablet (4 mg total) by mouth every 8 (eight) hours as needed for nausea or vomiting. 03/17/22   Harvest Dark, MD     Allergies  Protonix [pantoprazole]   Family History   Family History  Problem Relation Age of Onset   Cancer Mother    Cancer Father      Physical Exam  Triage Vital Signs: ED Triage Vitals [04/04/22 0152]  Enc Vitals Group     BP (!) 153/99     Pulse Rate 70     Resp 16     Temp 98.1 F (36.7 C)     Temp Source Oral     SpO2 97 %     Weight 130 lb (59 kg)     Height '5\' 10"'$  (1.778 m)     Head Circumference      Peak Flow      Pain Score 9     Pain Loc      Pain Edu?      Excl. in [place]?     Updated Vital Signs: BP (!) 147/98 (BP Location: Right Arm)   Pulse 78   Temp 98.1 F (36.7 C) (Oral)   Resp 17   Ht '5\' 10"'$  (1.778 m)   Wt 59 kg   SpO2 97%   BMI 18.65 kg/m    General: Awake, no distress.  CV:  RRR.  Good peripheral perfusion.  Resp:  Normal effort.  CTA B. Abd:  Nontender.  No distention.  Other:  Alert and oriented x3.  CN II toXII grossly intact.  5/5 motor strength and sensation all extremities. MAEx4.  No carotid bruits.  Supple neck without meningismus.  PERRL.  EOMI.   ED Results / Procedures / Treatments  Labs (all labs ordered are listed, but only abnormal results are displayed) Labs Reviewed - No data to display   EKG  None   RADIOLOGY None   Official radiology report(s): No results found.   PROCEDURES:  Critical Care performed:  No  Procedures   MEDICATIONS ORDERED IN ED: Medications  ondansetron (ZOFRAN) tablet 4 mg (4 mg Oral Given 04/04/22 0155)  sodium chloride 0.9 % bolus 1,000 mL (1,000 mLs Intravenous New Bag/Given 04/04/22 0458)  ketorolac (TORADOL) 30 MG/ML injection 30 mg (30 mg Intravenous Given 04/04/22 0500)  metoCLOPramide (REGLAN) injection 10 mg (10 mg Intravenous Given 04/04/22 0500)  diphenhydrAMINE (BENADRYL) injection 50 mg (50 mg Intravenous Given 04/04/22 0500)  dexamethasone (DECADRON) injection 10 mg (10 mg Intravenous Given 04/04/22 0500)  ondansetron (ZOFRAN) injection 4 mg (4 mg Intravenous Given 04/04/22 0508)     IMPRESSION / MDM / ASSESSMENT AND PLAN / ED COURSE  I reviewed the triage vital signs  and the nursing notes.                             66 year old male presenting with headache consistent with his typical migraine headaches.  No focal neurological deficits on examination.  It is noted he has had CT and MRI of his brain within the past several years which has been unremarkable.  Will administer IV headache cocktail consisting of Toradol, Reglan, Benadryl and Decadron as well as IV fluids.  Will reassess.   Clinical Course as of 04/04/22 0639  Tue Apr 04, 2022  0638 Feeling significantly better.  Has Zofran at home and has an appointment to see his neurologist this week.  No focal neurological deficits on reexamination.  Strict return precautions given.  Patient verbalizes understanding and agrees with plan of care. [JS]    Clinical Course User Index [JS] Paulette Blanch, MD     FINAL CLINICAL IMPRESSION(S) / ED DIAGNOSES   Final diagnoses:  Other migraine without status migrainosus, not intractable     Rx / DC Orders   ED Discharge Orders     None        Note:  This document was prepared using Dragon voice recognition software and may include unintentional dictation errors.   Paulette Blanch, MD 04/04/22 [phone]

## 2022-04-04 NOTE — Discharge Instructions (Signed)
Please take the medications available to you as prescribed by your neurologist.  Return to the ER for worsening symptoms, persistent vomiting, difficulty breathing or other concerns.

## 2022-04-04 NOTE — ED Triage Notes (Signed)
Pt presents to ER c/o migraine that started Friday and has not gone away.  Pt took Excedrin at home without relief.  Pt endorses photosensitivity, and n/v.  Pt is A&O x4 at this time in NAD in triage.

## 2022-04-04 NOTE — ED Notes (Addendum)
While administering headache cocktail, pt starts to dry heave and then vomit. MD aware, awaiting order for antiemetic

## 2022-04-07 DIAGNOSIS — M4802 Spinal stenosis, cervical region: Secondary | ICD-10-CM | POA: Diagnosis not present

## 2022-04-07 DIAGNOSIS — M5412 Radiculopathy, cervical region: Secondary | ICD-10-CM | POA: Diagnosis not present

## 2022-04-07 DIAGNOSIS — M62838 Other muscle spasm: Secondary | ICD-10-CM | POA: Diagnosis not present

## 2022-04-07 DIAGNOSIS — M5030 Other cervical disc degeneration, unspecified cervical region: Secondary | ICD-10-CM | POA: Diagnosis not present

## 2022-04-09 ENCOUNTER — Encounter: Payer: Self-pay | Admitting: Emergency Medicine

## 2022-04-09 ENCOUNTER — Other Ambulatory Visit: Payer: Self-pay

## 2022-04-09 ENCOUNTER — Emergency Department
Admission: EM | Admit: 2022-04-09 | Discharge: 2022-04-09 | Disposition: A | Discharge status: Home / Self Care | Payer: No Typology Code available for payment source | Attending: Emergency Medicine | Admitting: Emergency Medicine

## 2022-04-09 DIAGNOSIS — I10 Essential (primary) hypertension: Secondary | ICD-10-CM | POA: Diagnosis not present

## 2022-04-09 DIAGNOSIS — G43809 Other migraine, not intractable, without status migrainosus: Secondary | ICD-10-CM | POA: Insufficient documentation

## 2022-04-09 DIAGNOSIS — R519 Headache, unspecified: Secondary | ICD-10-CM | POA: Diagnosis present

## 2022-04-09 MED ORDER — DIPHENHYDRAMINE HCL 25 MG PO CAPS
50.0000 mg | ORAL_CAPSULE | Freq: Once | ORAL | Status: AC
  Administered 2022-04-09: 50 mg via ORAL
  Filled 2022-04-09: qty 2

## 2022-04-09 MED ORDER — KETOROLAC TROMETHAMINE 30 MG/ML IJ SOLN
30.0000 mg | Freq: Once | INTRAMUSCULAR | Status: AC
  Administered 2022-04-09: 30 mg via INTRAMUSCULAR
  Filled 2022-04-09: qty 1

## 2022-04-09 MED ORDER — METOCLOPRAMIDE HCL 10 MG PO TABS
10.0000 mg | ORAL_TABLET | Freq: Once | ORAL | Status: AC
  Administered 2022-04-09: 10 mg via ORAL
  Filled 2022-04-09: qty 1

## 2022-04-11 ENCOUNTER — Other Ambulatory Visit: Payer: Self-pay

## 2022-04-11 ENCOUNTER — Emergency Department
Admission: EM | Admit: 2022-04-11 | Discharge: 2022-04-11 | Disposition: A | Discharge status: Home / Self Care | Payer: No Typology Code available for payment source | Attending: Emergency Medicine | Admitting: Emergency Medicine

## 2022-04-11 ENCOUNTER — Ambulatory Visit (HOSPITAL_BASED_OUTPATIENT_CLINIC_OR_DEPARTMENT_OTHER): Payer: No Typology Code available for payment source | Admitting: Pain Medicine

## 2022-04-11 ENCOUNTER — Encounter: Payer: Self-pay | Admitting: Pain Medicine

## 2022-04-11 VITALS — BP 134/87 | HR 93 | Temp 97.1°F | Ht 70.0 in | Wt 130.0 lb

## 2022-04-11 DIAGNOSIS — M5030 Other cervical disc degeneration, unspecified cervical region: Secondary | ICD-10-CM | POA: Insufficient documentation

## 2022-04-11 DIAGNOSIS — M4312 Spondylolisthesis, cervical region: Secondary | ICD-10-CM

## 2022-04-11 DIAGNOSIS — G4486 Cervicogenic headache: Secondary | ICD-10-CM

## 2022-04-11 DIAGNOSIS — M5090 Cervical disc disorder, unspecified, unspecified cervical region: Secondary | ICD-10-CM | POA: Insufficient documentation

## 2022-04-11 DIAGNOSIS — M549 Dorsalgia, unspecified: Secondary | ICD-10-CM | POA: Insufficient documentation

## 2022-04-11 DIAGNOSIS — R519 Headache, unspecified: Secondary | ICD-10-CM

## 2022-04-11 DIAGNOSIS — M47812 Spondylosis without myelopathy or radiculopathy, cervical region: Secondary | ICD-10-CM

## 2022-04-11 DIAGNOSIS — M542 Cervicalgia: Secondary | ICD-10-CM | POA: Insufficient documentation

## 2022-04-11 DIAGNOSIS — G8929 Other chronic pain: Secondary | ICD-10-CM | POA: Insufficient documentation

## 2022-04-11 DIAGNOSIS — M4692 Unspecified inflammatory spondylopathy, cervical region: Secondary | ICD-10-CM

## 2022-04-11 DIAGNOSIS — G43809 Other migraine, not intractable, without status migrainosus: Secondary | ICD-10-CM

## 2022-04-11 DIAGNOSIS — M5382 Other specified dorsopathies, cervical region: Secondary | ICD-10-CM

## 2022-04-11 DIAGNOSIS — G43909 Migraine, unspecified, not intractable, without status migrainosus: Secondary | ICD-10-CM | POA: Insufficient documentation

## 2022-04-11 MED ORDER — ONDANSETRON 4 MG PO TBDP
4.0000 mg | ORAL_TABLET | Freq: Once | ORAL | Status: AC
  Administered 2022-04-11: 4 mg via ORAL
  Filled 2022-04-11: qty 1

## 2022-04-11 MED ORDER — BUTALBITAL-APAP-CAFFEINE 50-325-40 MG PO TABS
2.0000 | ORAL_TABLET | Freq: Once | ORAL | Status: AC
  Administered 2022-04-11: 2 via ORAL
  Filled 2022-04-11: qty 2

## 2022-04-11 MED ORDER — KETOROLAC TROMETHAMINE 30 MG/ML IJ SOLN
30.0000 mg | Freq: Once | INTRAMUSCULAR | Status: AC
Start: 2022-04-11 — End: 2022-04-11
  Administered 2022-04-11: 30 mg via INTRAMUSCULAR
  Filled 2022-04-11: qty 1

## 2022-04-11 MED ORDER — DROPERIDOL 2.5 MG/ML IJ SOLN
1.2500 mg | Freq: Once | INTRAMUSCULAR | Status: AC
  Administered 2022-04-11: 1.25 mg via INTRAMUSCULAR
  Filled 2022-04-11: qty 2

## 2022-04-12 ENCOUNTER — Encounter: Payer: Self-pay | Admitting: Emergency Medicine

## 2022-04-12 ENCOUNTER — Other Ambulatory Visit: Payer: Self-pay

## 2022-04-12 ENCOUNTER — Emergency Department
Admission: EM | Admit: 2022-04-12 | Discharge: 2022-04-12 | Disposition: A | Discharge status: Home / Self Care | Payer: No Typology Code available for payment source | Attending: Emergency Medicine | Admitting: Emergency Medicine

## 2022-04-12 DIAGNOSIS — G43909 Migraine, unspecified, not intractable, without status migrainosus: Secondary | ICD-10-CM | POA: Diagnosis not present

## 2022-04-12 DIAGNOSIS — R519 Headache, unspecified: Secondary | ICD-10-CM | POA: Insufficient documentation

## 2022-04-12 MED ORDER — DROPERIDOL 2.5 MG/ML IJ SOLN
2.5000 mg | Freq: Once | INTRAMUSCULAR | Status: AC
  Administered 2022-04-12: 2.5 mg via INTRAMUSCULAR
  Filled 2022-04-12: qty 2

## 2022-04-12 MED ORDER — LIDOCAINE 5 % EX PTCH
1.0000 | MEDICATED_PATCH | CUTANEOUS | Status: DC
  Administered 2022-04-12: 1 via TRANSDERMAL
  Filled 2022-04-12: qty 1

## 2022-04-12 MED ORDER — BUTALBITAL-APAP-CAFFEINE 50-325-40 MG PO TABS
2.0000 | ORAL_TABLET | Freq: Once | ORAL | Status: AC
  Administered 2022-04-12: 2 via ORAL
  Filled 2022-04-12: qty 2

## 2022-04-12 MED ORDER — KETOROLAC TROMETHAMINE 30 MG/ML IJ SOLN
30.0000 mg | Freq: Once | INTRAMUSCULAR | Status: AC
  Administered 2022-04-12: 30 mg via INTRAMUSCULAR
  Filled 2022-04-12: qty 1

## 2022-04-12 MED ORDER — ONDANSETRON 4 MG PO TBDP
4.0000 mg | ORAL_TABLET | Freq: Once | ORAL | Status: AC
  Administered 2022-04-12: 4 mg via ORAL
  Filled 2022-04-12: qty 1

## 2022-04-12 MED ORDER — LIDOCAINE 5 % EX PTCH: 1.0000 | MEDICATED_PATCH | Freq: Two times a day (BID) | CUTANEOUS | 0 refills | Status: DC

## 2022-04-12 MED ORDER — BUTALBITAL-APAP-CAFFEINE 50-325-40 MG PO TABS: 2.0000 | ORAL_TABLET | Freq: Four times a day (QID) | ORAL | 0 refills | Status: DC | PRN

## 2022-04-12 NOTE — ED Provider Notes (Signed)
[facility] Provider Note    Event Date/Time   First MD Initiated Contact with Patient 04/12/22 [phone]     (approximate)   History   Migraine   HPI  Johnathan Harvey is a 66 y.o. male who presents to the ED for evaluation of Migraine   I saw this patient yesterday for the same complaints and remember him distinctly.  This is his seventh ED visit in the past 1 month for the same.  After I saw him in the ED yesterday, resolving pain with a migraine cocktail without diagnostics, he went to see his pain management physician.  No procedures performed yesterday during this visit.  Patient presents to the ED for evaluation of another headache.  He reports he took 800 mg of ibuprofen shortly after midnight, but no other medications.  Reports he always gets a headache after he lays down for bed at night.  Reports his pain doctor advised him to try sleeping with no pillow last night, and he reports developing a typical headache despite this.  Does not take any other medications this morning and presents to the ED requesting help with a headache.  Derm initial evaluation, we discussed decision making and plan of care.  He reports he has had MRIs and CT scans of his head in the past but "no one can give me anything to make this go away."  He reports he is in the process of getting insurance approval to have a more definitive procedure done by his pain management physician.  He is not interested in any diagnostics.  We report the possibility of underlying structural pathology or more severe derangements, but he is just requesting medications and acknowledges the risks of other undiagnosed pathology.  He denies any novel features of his headache today.  No fevers, falls or injuries, emesis, vision changes, weakness to the extremities.   Physical Exam   Triage Vital Signs: ED Triage Vitals  Enc Vitals Group     BP 04/12/22 0622 (!) 177/97     Pulse Rate 04/12/22 0622 77      Resp 04/12/22 0622 18     Temp 04/12/22 0622 97.9 F (36.6 C)     Temp Source 04/12/22 0622 Oral     SpO2 04/12/22 0622 100 %     Weight 04/12/22 0624 130 lb (59 kg)     Height 04/12/22 0624 '5\' 10"'$  (1.778 m)     Head Circumference --      Peak Flow --      Pain Score 04/12/22 0624 9     Pain Loc --      Pain Edu? --      Excl. in [place]? --     Most recent vital signs: Vitals:   04/12/22 0622 04/12/22 0904  BP: (!) 177/97 (!) 161/98  Pulse: 77 65  Resp: 18 16  Temp: 97.9 F (36.6 C)   SpO2: 100% 98%    General: Awake, no distress.  Ambulatory with normal gait.  Seems photophobic. CV:  Good peripheral perfusion.  Resp:  Normal effort.  Abd:  No distention.  MSK:  No deformity noted.  No signs of trauma to the neck, swelling, skin changes.  No meningismus. Neuro:  No focal deficits appreciated. Cranial nerves II through XII intact 5/5 strength and sensation in all 4 extremities Other:     ED Results / Procedures / Treatments   Labs (all labs ordered are listed, but only abnormal results  are displayed) Labs Reviewed - No data to display  EKG   RADIOLOGY   Official radiology report(s): No results found.  PROCEDURES and INTERVENTIONS:  Procedures  Medications  lidocaine (LIDODERM) 5 % 1 patch (1 patch Transdermal Patch Applied 04/12/22 0754)  butalbital-acetaminophen-caffeine (FIORICET) 50-325-40 MG per tablet 2 tablet (2 tablets Oral Given 04/12/22 0754)  ketorolac (TORADOL) 30 MG/ML injection 30 mg (30 mg Intramuscular Given 04/12/22 0754)  ondansetron (ZOFRAN-ODT) disintegrating tablet 4 mg (4 mg Oral Given 04/12/22 0755)  droperidol (INAPSINE) 2.5 MG/ML injection 2.5 mg (2.5 mg Intramuscular Given 04/12/22 0905)     IMPRESSION / MDM / ASSESSMENT AND PLAN / ED COURSE  I reviewed the triage vital signs and the nursing notes.  Differential diagnosis includes, but is not limited to, typical migraine, malingering, meningitis, encephalitis, intracranial mass, venous  sinus thrombosis  {Patient presents with symptoms of an acute illness or injury that is potentially life-threatening.  66 year old male with history of chronic headaches again presents with another headache.  He looks systemically well, is ambulatory without meningismus, neurologic or vascular deficits.  As indicated separately, I have many discussions with the patient about his headaches, possible etiologies and recommendation for diagnostics but he declines.  I think these more severe pathologies are less likely, but certainly within the realm of reason considering his poorly controlled headaches.  He acknowledges this and requests discharge.  Will discharge with Fioricet, lidocaine patches and recommendations to take his NSAIDs that he has at home as well.  We discussed appropriate return precautions.  Clinical Course as of 04/12/22 0936  Wed Apr 12, 2022  0854 Reassessed.  Patient reports feeling a little bit better.  I again discussed plan of care.  We discussed diagnostics.  Considering the recurrent and worsening nature of his symptoms, we discussed blood work, MRI and a lumbar puncture.  Patient refuses this, acknowledging the possibility of undiagnosed pathology such as brain tumor, viral meningitis, venous sinus thrombosis or other more severe derangements.  He is requesting something for nausea. [DS]  3790 Reassessed.  Patient reports feeling much better after the droperidol.  I again recommend further diagnostics and he again declines acknowledging the risks.  He is asking to go home.  He has capacity to make this decision. [DS]    Clinical Course User Index [DS] Vladimir Crofts, MD     FINAL CLINICAL IMPRESSION(S) / ED DIAGNOSES   Final diagnoses:  Bad headache     Rx / DC Orders   ED Discharge Orders          Ordered    butalbital-acetaminophen-caffeine (FIORICET) 50-325-40 MG tablet  Every 6 hours PRN        04/12/22 0935    lidocaine (LIDODERM) 5 %  Every 12 hours         04/12/22 0935             Note:  This document was prepared using Dragon voice recognition software and may include unintentional dictation errors.   Vladimir Crofts, MD 04/12/22 [phone]

## 2022-04-12 NOTE — ED Triage Notes (Signed)
Patient ambulatory to triage with steady gait, without difficulty or distress noted; pt reports migraine, has been seen multiple times for same recently, last visit yesterday

## 2022-04-12 NOTE — Discharge Instructions (Signed)
Use the Fioricet prescription medication, 2 pills at a time, every 6-8 hours.  This includes Tylenol, so please do not take any additional Tylenol on top of this.  Use the prescription ibuprofen that you have at home as well  Please use lidocaine patches at your site of pain.  Apply 1 patch at a time, leave on for 12 hours, then remove for 12 hours.  12 hours on, 12 hours off.  Do not apply more than 1 patch at a time.

## 2022-04-19 ENCOUNTER — Encounter: Payer: Self-pay | Admitting: Emergency Medicine

## 2022-04-19 ENCOUNTER — Emergency Department
Admission: EM | Admit: 2022-04-19 | Discharge: 2022-04-19 | Disposition: A | Discharge status: Home / Self Care | Payer: No Typology Code available for payment source | Attending: Emergency Medicine | Admitting: Emergency Medicine

## 2022-04-19 ENCOUNTER — Other Ambulatory Visit: Payer: Self-pay

## 2022-04-19 DIAGNOSIS — R519 Headache, unspecified: Secondary | ICD-10-CM

## 2022-04-19 DIAGNOSIS — G43709 Chronic migraine without aura, not intractable, without status migrainosus: Secondary | ICD-10-CM | POA: Insufficient documentation

## 2022-04-19 DIAGNOSIS — I10 Essential (primary) hypertension: Secondary | ICD-10-CM | POA: Insufficient documentation

## 2022-04-19 MED ORDER — KETOROLAC TROMETHAMINE 30 MG/ML IJ SOLN
15.0000 mg | Freq: Once | INTRAMUSCULAR | Status: AC
  Administered 2022-04-19: 15 mg via INTRAVENOUS
  Filled 2022-04-19: qty 1

## 2022-04-19 MED ORDER — PROCHLORPERAZINE EDISYLATE 10 MG/2ML IJ SOLN
10.0000 mg | Freq: Once | INTRAMUSCULAR | Status: AC
  Administered 2022-04-19: 10 mg via INTRAVENOUS
  Filled 2022-04-19: qty 2

## 2022-04-19 MED ORDER — SODIUM CHLORIDE 0.9 % IV BOLUS
1000.0000 mL | Freq: Once | INTRAVENOUS | Status: AC
  Administered 2022-04-19: 1000 mL via INTRAVENOUS

## 2022-04-19 MED ORDER — DIPHENHYDRAMINE HCL 50 MG/ML IJ SOLN
25.0000 mg | Freq: Once | INTRAMUSCULAR | Status: AC
  Administered 2022-04-19: 25 mg via INTRAVENOUS
  Filled 2022-04-19: qty 1

## 2022-04-19 MED ORDER — PROCHLORPERAZINE MALEATE 10 MG PO TABS: 10.0000 mg | ORAL_TABLET | Freq: Four times a day (QID) | ORAL | 0 refills | Status: DC | PRN

## 2022-04-19 NOTE — ED Triage Notes (Signed)
Pt can now be heard dry heaving in lobby and attempting to make himself throw up.

## 2022-04-19 NOTE — ED Provider Notes (Signed)
[facility] Provider Note    Event Date/Time   First MD Initiated Contact with Patient 04/19/22 [phone]     (approximate)   History   Chief Complaint Migraine   HPI  Johnathan Harvey is a 66 y.o. male with past medical history of hypertension, chronic pain syndrome, and chronic headache who presents to the ED complaining of migraine.  Patient reports that for the past couple of years he has been dealing with chronic pain starting in his neck and moving up into the back of his head.  He describes it as a dull ache and throbbing that is exacerbated by bright lights and loud noises.  It has seemed to have gotten worse over the past 24 hours, associated with nausea and a couple episodes of vomiting.  He denies any associated vision changes, speech changes, numbness, or weakness.  He has not had any trauma to his head or neck.  He currently follows with pain management and has received injections into his neck previously, but states they have not recently helped.  He is not currently taking any medications at home for his symptoms.     Physical Exam   Triage Vital Signs: ED Triage Vitals  Enc Vitals Group     BP 04/19/22 0044 (!) 156/100     Pulse Rate 04/19/22 0044 80     Resp 04/19/22 0044 18     Temp 04/19/22 0044 98.7 F (37.1 C)     Temp Source 04/19/22 0044 Oral     SpO2 04/19/22 0044 98 %     Weight --      Height --      Head Circumference --      Peak Flow --      Pain Score 04/19/22 0046 10     Pain Loc --      Pain Edu? --      Excl. in [place]? --     Most recent vital signs: Vitals:   04/19/22 0044 04/19/22 0712  BP: (!) 156/100 (!) 156/110  Pulse: 80 66  Resp: 18 18  Temp: 98.7 F (37.1 C)   SpO2: 98% 95%    Constitutional: Alert and oriented. Eyes: Conjunctivae are normal. Head: Atraumatic. Nose: No congestion/rhinnorhea. Mouth/Throat: Mucous membranes are moist.  Neck: No midline cervical spine tenderness to palpation, supple  with no meningismus. Cardiovascular: Normal rate, regular rhythm. Grossly normal heart sounds.  2+ radial pulses bilaterally. Respiratory: Normal respiratory effort.  No retractions. Lungs CTAB. Gastrointestinal: Soft and nontender. No distention. Musculoskeletal: No lower extremity tenderness nor edema.  Neurologic:  Normal speech and language. No gross focal neurologic deficits are appreciated.    ED Results / Procedures / Treatments   Labs (all labs ordered are listed, but only abnormal results are displayed) Labs Reviewed - No data to display   PROCEDURES:  Critical Care performed: No  Procedures   MEDICATIONS ORDERED IN ED: Medications  prochlorperazine (COMPAZINE) injection 10 mg (10 mg Intravenous Given 04/19/22 0800)  diphenhydrAMINE (BENADRYL) injection 25 mg (25 mg Intravenous Given 04/19/22 0759)  ketorolac (TORADOL) 30 MG/ML injection 15 mg (15 mg Intravenous Given 04/19/22 0758)  sodium chloride 0.9 % bolus 1,000 mL (1,000 mLs Intravenous New Bag/Given 04/19/22 0757)     IMPRESSION / MDM / [place] / ED COURSE  I reviewed the triage vital signs and the nursing notes.  66 y.o. male with past medical history of hypertension, chronic pain syndrome, and chronic headaches who presents to the ED with acute on chronic pain extending from his neck up into the back of his head that he describes as similar to prior migraines.  Patient's presentation is most consistent with severe exacerbation of chronic illness.  Differential diagnosis includes, but is not limited to, migraine headache, tension headache, chronic pain syndrome, SAH, meningitis.  Patient nontoxic-appearing and in no acute distress, vital signs remarkable for hypertension but otherwise reassuring.  He has no focal neurologic deficits on exam and symptoms are described as similar to his prior migraines, doubt intracranial process and no reason to suspect SAH or meningitis at  this time.  We will treat with migraine cocktail, as this has helped him in the past, after which she would be appropriate for discharge home with pain management follow-up.  Patient reports symptoms improving following migraine cocktail and he is appropriate for discharge home.  He will be prescribed small amount of Compazine and was counseled to follow-up with his PCP as well as pain management.  He was counseled to return to the ED for new worsening symptoms, patient agrees with plan.      FINAL CLINICAL IMPRESSION(S) / ED DIAGNOSES   Final diagnoses:  Chronic nonintractable headache, unspecified headache type     Rx / DC Orders   ED Discharge Orders          Ordered    prochlorperazine (COMPAZINE) 10 MG tablet  Every 6 hours PRN        04/19/22 0853             Note:  This document was prepared using Dragon voice recognition software and may include unintentional dictation errors.   Blake Divine, MD 04/19/22 [phone]

## 2022-04-19 NOTE — ED Triage Notes (Signed)
Pt to ED via POV with c/o migraine. Pt seen multiple times for same. Pt A&O x4, ambulatory with steady gait to triage desk. Pt c/o continued migraine, no relief with home medications/heating pad. Pt c/o photosensitivity/sound sensitivity.

## 2022-04-19 NOTE — ED Notes (Signed)
ED Provider at bedside. 

## 2022-04-23 NOTE — Progress Notes (Unsigned)
PROVIDER NOTE: Interpretation of information contained herein should be left to medically-trained personnel. Specific patient instructions are provided elsewhere under "Patient Instructions" section of medical record. This document was created in part using STT-dictation technology, any transcriptional errors that may result from this process are unintentional.  Patient: Johnathan Harvey Type: Established DOB: [birth year] MRN: [id number] PCP: Rusty Aus, MD  Service: Procedure DOS: 04/25/2022 Setting: Ambulatory Location: Ambulatory outpatient facility Delivery: Face-to-face Provider: Gaspar Cola, MD Specialty: Interventional Pain Management Specialty designation: 09 Location: Outpatient facility Ref. Prov.: Milinda Pointer, MD    Primary Reason for Visit: Interventional Pain Management Treatment. CC: No chief complaint on file.   Procedure:           Type: Cervical Facet Medial Branch Block(s)  ***   Laterality:  ***   Level: C3, C4, C5, C6, & C7 Medial Branch Level(s). Injecting these levels blocks the C3-4, C4-5, C5-6, and C6-7 cervical facet joints.  Imaging: Fluoroscopic guidance Anesthesia: Local anesthesia (1-2% Lidocaine) Anxiolysis: None                 Sedation: None. DOS: 04/25/2022  Performed by: Gaspar Cola, MD  Purpose: Diagnostic/Therapeutic Indications: Cervicalgia (cervical spine axial pain) severe enough to impact quality of life or function. No diagnosis found. NAS-11 Pain score:   Pre-procedure:  /10   Post-procedure:  /10     Position / Prep / Materials:  Position: Prone. Head in cradle. C-spine slightly flexed. Prep solution: DuraPrep (Iodine Povacrylex [0.7% available iodine] and Isopropyl Alcohol, 74% w/w) Prep Area: Posterior Cervico-thoracic Region. From occipital ridge to tip of scapula, and from shoulder to shoulder. Entire posterior and lateral neck surface. Materials:  Tray: Block Needle(s):  Type: Spinal  Gauge (G): 22"   Length: 3.5-in  Qty: 5  Pre-op H&P Assessment:  Mr. Domangue is a 66 y.o. (year old), male patient, seen today for interventional treatment. He  has a past surgical history that includes Ankle arthroplasty; Esophagogastroduodenoscopy (N/A, 11/04/2018); and Inguinal hernia repair (Left, 09/26/2019). Mr. Leeth has a current medication list which includes the following prescription(s): amitriptyline, butalbital-acetaminophen-caffeine, diclofenac, gabapentin, lidocaine, ondansetron, and prochlorperazine. His primarily concern today is the No chief complaint on file.  Initial Vital Signs:  Pulse/HCG Rate:    Temp:   Resp:   BP:   SpO2:    BMI: Estimated body mass index is 18.65 kg/m as calculated from the following:   Height as of 04/12/22: '5\' 10"'$  (1.778 m).   Weight as of 04/12/22: 130 lb (59 kg).  Risk Assessment: Allergies: Reviewed. He is allergic to protonix [pantoprazole].  Allergy Precautions: None required Coagulopathies: Reviewed. None identified.  Blood-thinner therapy: None at this time Active Infection(s): Reviewed. None identified. Mr. Klugh is afebrile  Site Confirmation: Mr. Catterton was asked to confirm the procedure and laterality before marking the site Procedure checklist: Completed Consent: Before the procedure and under the influence of no sedative(s), amnesic(s), or anxiolytics, the patient was informed of the treatment options, risks and possible complications. To fulfill our ethical and legal obligations, as recommended by the American Medical Association's Code of Ethics, I have informed the patient of my clinical impression; the nature and purpose of the treatment or procedure; the risks, benefits, and possible complications of the intervention; the alternatives, including doing nothing; the risk(s) and benefit(s) of the alternative treatment(s) or procedure(s); and the risk(s) and benefit(s) of doing nothing. The patient was provided information about the  general risks and possible complications associated with the  procedure. These may include, but are not limited to: failure to achieve desired goals, infection, bleeding, organ or nerve damage, allergic reactions, paralysis, and death. In addition, the patient was informed of those risks and complications associated to Spine-related procedures, such as failure to decrease pain; infection (i.e.: Meningitis, epidural or intraspinal abscess); bleeding (i.e.: epidural hematoma, subarachnoid hemorrhage, or any other type of intraspinal or peri-dural bleeding); organ or nerve damage (i.e.: Any type of peripheral nerve, nerve root, or spinal cord injury) with subsequent damage to sensory, motor, and/or autonomic systems, resulting in permanent pain, numbness, and/or weakness of one or several areas of the body; allergic reactions; (i.e.: anaphylactic reaction); and/or death. Furthermore, the patient was informed of those risks and complications associated with the medications. These include, but are not limited to: allergic reactions (i.e.: anaphylactic or anaphylactoid reaction(s)); adrenal axis suppression; blood sugar elevation that in diabetics may result in ketoacidosis or comma; water retention that in patients with history of congestive heart failure may result in shortness of breath, pulmonary edema, and decompensation with resultant heart failure; weight gain; swelling or edema; medication-induced neural toxicity; particulate matter embolism and blood vessel occlusion with resultant organ, and/or nervous system infarction; and/or aseptic necrosis of one or more joints. Finally, the patient was informed that Medicine is not an exact science; therefore, there is also the possibility of unforeseen or unpredictable risks and/or possible complications that may result in a catastrophic outcome. The patient indicated having understood very clearly. We have given the patient no guarantees and we have made no promises.  Enough time was given to the patient to ask questions, all of which were answered to the patient's satisfaction. Mr. Pheasant has indicated that he wanted to continue with the procedure. Attestation: I, the ordering provider, attest that I have discussed with the patient the benefits, risks, side-effects, alternatives, likelihood of achieving goals, and potential problems during recovery for the procedure that I have provided informed consent. Date  Time: {CHL [facility]-PAIN TIME CHOICES:[id number]}  Pre-Procedure Preparation:  Monitoring: As per clinic protocol. Respiration, ETCO2, SpO2, BP, heart rate and rhythm monitor placed and checked for adequate function Safety Precautions: Patient was assessed for positional comfort and pressure points before starting the procedure. Time-out: I initiated and conducted the "Time-out" before starting the procedure, as per protocol. The patient was asked to participate by confirming the accuracy of the "Time Out" information. Verification of the correct person, site, and procedure were performed and confirmed by me, the nursing staff, and the patient. "Time-out" conducted as per Joint Commission's Universal Protocol (UP.01.01.01). Time:    Description/Narrative of Procedure:          Laterality:  ***  Targeted Levels:  C3, C4, C5, C6, & C7 Medial Branch Level(s).  Rationale (medical necessity): procedure needed and proper for the diagnosis and/or treatment of the patient's medical symptoms and needs. Procedural Technique Safety Precautions: Aspiration looking for blood return was conducted prior to all injections. At no point did we inject any substances, as a needle was being advanced. No attempts were made at seeking any paresthesias. Safe injection practices and needle disposal techniques used. Medications properly checked for expiration dates. SDV (single dose vial) medications used. Description of the Procedure: Protocol guidelines were followed. The patient  was assisted into a comfortable position. The target area was identified and the area prepped in the usual manner. Skin & deeper tissues infiltrated with local anesthetic. Appropriate amount of time allowed to pass for local anesthetics to take effect. The procedure  needles were then advanced to the target area. Proper needle placement secured. Negative aspiration confirmed. Solution injected in intermittent fashion, asking for systemic symptoms every 0.5cc of injectate. The needles were then removed and the area cleansed, making sure to leave some of the prepping solution back to take advantage of its long term bactericidal properties.  Technical description of process:  C3 Medial Branch Nerve Block (MBB): The target area for the C3 dorsal medial articular branch is the lateral concave waist of the articular pillar of C3. Under fluoroscopic guidance, a Quincke needle was inserted until contact was made with os over the postero-lateral aspect of the articular pillar of C3 (target area). After negative aspiration for blood, 0.5 mL of the nerve block solution was injected without difficulty or complication. The needle was removed intact. C4 Medial Branch Nerve Block (MBB): The target area for the C4 dorsal medial articular branch is the lateral concave waist of the articular pillar of C4. Under fluoroscopic guidance, a Quincke needle was inserted until contact was made with os over the postero-lateral aspect of the articular pillar of C4 (target area). After negative aspiration for blood, 0.5 mL of the nerve block solution was injected without difficulty or complication. The needle was removed intact. C5 Medial Branch Nerve Block (MBB): The target area for the C5 dorsal medial articular branch is the lateral concave waist of the articular pillar of C5. Under fluoroscopic guidance, a Quincke needle was inserted until contact was made with os over the postero-lateral aspect of the articular pillar of C5 (target  area). After negative aspiration for blood, 0.5 mL of the nerve block solution was injected without difficulty or complication. The needle was removed intact. C6 Medial Branch Nerve Block (MBB): The target area for the C6 dorsal medial articular branch is the lateral concave waist of the articular pillar of C6. Under fluoroscopic guidance, a Quincke needle was inserted until contact was made with os over the postero-lateral aspect of the articular pillar of C6 (target area). After negative aspiration for blood, 0.5 mL of the nerve block solution was injected without difficulty or complication. The needle was removed intact. C7 Medial Branch Nerve Block (MBB): The target for the C7 dorsal medial articular branch lies on the superior-medial tip of the C7 transverse process. Under fluoroscopic guidance, a Quincke needle was inserted until contact was made with os over the postero-lateral aspect of the articular pillar of C7 (target area). After negative aspiration for blood, 0.5 mL of the nerve block solution was injected without difficulty or complication. The needle was removed intact.  Once the entire procedure was completed, the treated area was cleaned, making sure to leave some of the prepping solution back to take advantage of its long term bactericidal properties.  Anatomy Reference Guide:       There were no vitals filed for this visit.   Start Time:   hrs. End Time:   hrs.  Imaging Guidance (Spinal):          Type of Imaging Technique: Fluoroscopy Guidance (Spinal) Indication(s): Assistance in needle guidance and placement for procedures requiring needle placement in or near specific anatomical locations not easily accessible without such assistance. Exposure Time: Please see nurses notes. Contrast: None used. Fluoroscopic Guidance: I was personally present during the use of fluoroscopy. "Tunnel Vision Technique" used to obtain the best possible view of the target area. Parallax error  corrected before commencing the procedure. "Direction-depth-direction" technique used to introduce the needle under continuous pulsed fluoroscopy. Once  target was reached, antero-posterior, oblique, and lateral fluoroscopic projection used confirm needle placement in all planes. Images permanently stored in EMR. Interpretation: No contrast injected. I personally interpreted the imaging intraoperatively. Adequate needle placement confirmed in multiple planes. Permanent images saved into the patient's record.  Post-operative Assessment:  Post-procedure Vital Signs:  Pulse/HCG Rate:    Temp:   Resp:   BP:   SpO2:    EBL: None  Complications: No immediate post-treatment complications observed by team, or reported by patient.  Note: The patient tolerated the entire procedure well. A repeat set of vitals were taken after the procedure and the patient was kept under observation following institutional policy, for this type of procedure. Post-procedural neurological assessment was performed, showing return to baseline, prior to discharge. The patient was provided with post-procedure discharge instructions, including a section on how to identify potential problems. Should any problems arise concerning this procedure, the patient was given instructions to immediately contact us, at any time, without hesitation. In any case, we plan to contact the patient by telephone for a follow-up status report regarding this interventional procedure.  Comments:  No additional relevant information.  Plan of Care  Orders:  No orders of the defined types were placed in this encounter.  Chronic Opioid Analgesic:  None MME/day: 0 mg/day   Medications ordered for procedure: No orders of the defined types were placed in this encounter.  Medications administered: Gerarda Fraction. Beckett had no medications administered during this visit.  See the medical record for exact dosing, route, and time of  administration.  Follow-up plan:   No follow-ups on file.       Interventional Therapies  Risk  Complexity Considerations:   Estimated body mass index is 18.65 kg/m as calculated from the following:   Height as of this encounter: '5\' 10"'$  (1.778 m).   Weight as of this encounter: 130 lb (59 kg). WNL   Planned  Pending:   Diagnostic right cervical facet C3-C7 MBB #1 & (B) C7 & T1 MBB #1    Under consideration:   Diagnostic right cervical facet C3-C7 MBB #1 & (B) C7 & T1 MBB #1    Completed:   Diagnostic right Cervical ESI x1 (03/23/2022) (DOS: 10-10/10) (0/0/0/0) (patient indicated having gone to the ED x4 since procedure.)   Completed by other providers:   Therapeutic right C3 and C4 medial branch RFA x1 (07/30/2020) by Sharlet Salina, DO [facility])  Diagnostic right C3 and C4 MBB x2 (06/30/2020, 07/15/2020) by Sharlet Salina, DO [facility])  Diagnostic right C3-4 zygapophyseal joint (facet joint) inj. x1 (03/05/2020) by Sharlet Salina, DO   Therapeutic  Palliative (PRN) options:   None established     Recent Visits Date Type Provider Dept  04/11/22 Office Visit Milinda Pointer, MD [facility]  03/23/22 Procedure visit Milinda Pointer, MD [facility]  02/15/22 Office Visit Milinda Pointer, MD [facility]  Showing recent visits within past 90 days and meeting all other requirements Future Appointments Date Type Provider Dept  04/25/22 Appointment Milinda Pointer, MD [facility]  Showing future appointments within next 90 days and meeting all other requirements  Disposition: Discharge home  Discharge (Date  Time): 04/25/2022;   hrs.   Primary Care Physician: Rusty Aus, MD Location: [facility] Note by: Gaspar Cola, MD Date: 04/25/2022; Time: 3:25 PM  Disclaimer:  Medicine is not an Chief Strategy Officer. The only guarantee in medicine is that nothing is guaranteed. It  is important to note  that the decision to proceed with this intervention was based on the information collected from the patient. The Data and conclusions were drawn from the patient's questionnaire, the interview, and the physical examination. Because the information was provided in large part by the patient, it cannot be guaranteed that it has not been purposely or unconsciously manipulated. Every effort has been made to obtain as much relevant data as possible for this evaluation. It is important to note that the conclusions that lead to this procedure are derived in large part from the available data. Always take into account that the treatment will also be dependent on availability of resources and existing treatment guidelines, considered by other Pain Management Practitioners as being common knowledge and practice, at the time of the intervention. For Medico-Legal purposes, it is also important to point out that variation in procedural techniques and pharmacological choices are the acceptable norm. The indications, contraindications, technique, and results of the above procedure should only be interpreted and judged by a Board-Certified Interventional Pain Specialist with extensive familiarity and expertise in the same exact procedure and technique.

## 2022-04-25 ENCOUNTER — Ambulatory Visit
Admission: RE | Admit: 2022-04-25 | Discharge: 2022-04-25 | Disposition: A | Discharge status: Home / Self Care | Payer: No Typology Code available for payment source | Source: Ambulatory Visit | Attending: Pain Medicine | Admitting: Pain Medicine

## 2022-04-25 ENCOUNTER — Ambulatory Visit: Payer: No Typology Code available for payment source | Attending: Pain Medicine | Admitting: Pain Medicine

## 2022-04-25 ENCOUNTER — Encounter: Payer: Self-pay | Admitting: Pain Medicine

## 2022-04-25 VITALS — BP 110/99 | HR 76 | Temp 97.3°F | Resp 15 | Ht 70.0 in | Wt 130.0 lb

## 2022-04-25 DIAGNOSIS — M47892 Other spondylosis, cervical region: Secondary | ICD-10-CM | POA: Insufficient documentation

## 2022-04-25 DIAGNOSIS — M5090 Cervical disc disorder, unspecified, unspecified cervical region: Secondary | ICD-10-CM | POA: Insufficient documentation

## 2022-04-25 DIAGNOSIS — Z79899 Other long term (current) drug therapy: Secondary | ICD-10-CM | POA: Insufficient documentation

## 2022-04-25 DIAGNOSIS — M4312 Spondylolisthesis, cervical region: Secondary | ICD-10-CM | POA: Diagnosis not present

## 2022-04-25 DIAGNOSIS — Z96669 Presence of unspecified artificial ankle joint: Secondary | ICD-10-CM | POA: Diagnosis not present

## 2022-04-25 DIAGNOSIS — M542 Cervicalgia: Secondary | ICD-10-CM | POA: Insufficient documentation

## 2022-04-25 DIAGNOSIS — Z9889 Other specified postprocedural states: Secondary | ICD-10-CM | POA: Insufficient documentation

## 2022-04-25 DIAGNOSIS — M5030 Other cervical disc degeneration, unspecified cervical region: Secondary | ICD-10-CM | POA: Diagnosis not present

## 2022-04-25 DIAGNOSIS — G8929 Other chronic pain: Secondary | ICD-10-CM | POA: Insufficient documentation

## 2022-04-25 DIAGNOSIS — Z79891 Long term (current) use of opiate analgesic: Secondary | ICD-10-CM | POA: Insufficient documentation

## 2022-04-25 DIAGNOSIS — M549 Dorsalgia, unspecified: Secondary | ICD-10-CM | POA: Insufficient documentation

## 2022-04-25 DIAGNOSIS — R519 Headache, unspecified: Secondary | ICD-10-CM | POA: Insufficient documentation

## 2022-04-25 DIAGNOSIS — M47812 Spondylosis without myelopathy or radiculopathy, cervical region: Secondary | ICD-10-CM | POA: Diagnosis not present

## 2022-04-25 DIAGNOSIS — R937 Abnormal findings on diagnostic imaging of other parts of musculoskeletal system: Secondary | ICD-10-CM | POA: Insufficient documentation

## 2022-04-25 DIAGNOSIS — G8918 Other acute postprocedural pain: Secondary | ICD-10-CM | POA: Insufficient documentation

## 2022-04-25 DIAGNOSIS — G4486 Cervicogenic headache: Secondary | ICD-10-CM | POA: Insufficient documentation

## 2022-04-25 MED ORDER — FENTANYL CITRATE (PF) 100 MCG/2ML IJ SOLN
INTRAMUSCULAR | Status: AC
  Filled 2022-04-25: qty 2

## 2022-04-25 MED ORDER — LIDOCAINE HCL 2 % IJ SOLN
INTRAMUSCULAR | Status: AC
  Filled 2022-04-25: qty 20

## 2022-04-25 MED ORDER — ROPIVACAINE HCL 2 MG/ML IJ SOLN
18.0000 mL | Freq: Once | INTRAMUSCULAR | Status: AC
  Administered 2022-04-25: 18 mL via PERINEURAL

## 2022-04-25 MED ORDER — HYDROCODONE-ACETAMINOPHEN 5-325 MG PO TABS: 1.0000 | ORAL_TABLET | Freq: Four times a day (QID) | ORAL | 0 refills | Status: DC | PRN

## 2022-04-25 MED ORDER — DEXAMETHASONE SODIUM PHOSPHATE 10 MG/ML IJ SOLN
INTRAMUSCULAR | Status: AC
  Filled 2022-04-25: qty 1

## 2022-04-25 MED ORDER — LIDOCAINE HCL 2 % IJ SOLN
20.0000 mL | Freq: Once | INTRAMUSCULAR | Status: AC
  Administered 2022-04-25: 400 mg

## 2022-04-25 MED ORDER — LACTATED RINGERS IV SOLN: Freq: Once | INTRAVENOUS | Status: AC

## 2022-04-25 MED ORDER — MIDAZOLAM HCL 5 MG/5ML IJ SOLN
0.5000 mg | Freq: Once | INTRAMUSCULAR | Status: AC
  Administered 2022-04-25: 4 mg via INTRAVENOUS

## 2022-04-25 MED ORDER — FENTANYL CITRATE (PF) 100 MCG/2ML IJ SOLN
25.0000 ug | INTRAMUSCULAR | Status: DC | PRN
  Administered 2022-04-25: 50 ug via INTRAVENOUS

## 2022-04-25 MED ORDER — ROPIVACAINE HCL 2 MG/ML IJ SOLN
INTRAMUSCULAR | Status: AC
  Filled 2022-04-25: qty 20

## 2022-04-25 MED ORDER — DEXAMETHASONE SODIUM PHOSPHATE 10 MG/ML IJ SOLN
20.0000 mg | Freq: Once | INTRAMUSCULAR | Status: AC
  Administered 2022-04-25: 20 mg

## 2022-04-25 MED ORDER — MIDAZOLAM HCL 5 MG/5ML IJ SOLN
INTRAMUSCULAR | Status: AC
  Filled 2022-04-25: qty 5

## 2022-04-25 NOTE — Patient Instructions (Signed)

## 2022-04-26 ENCOUNTER — Telehealth: Payer: Self-pay | Admitting: *Deleted

## 2022-04-26 NOTE — Telephone Encounter (Signed)
No problems post procedure. 

## 2022-05-08 NOTE — Progress Notes (Unsigned)
PROVIDER NOTE: Information contained herein reflects review and annotations entered in association with encounter. Interpretation of such information and data should be left to medically-trained personnel. Information provided to patient can be located elsewhere in the medical record under "Patient Instructions". Document created using STT-dictation technology, any transcriptional errors that may result from process are unintentional.    Patient: Johnathan Harvey  Service Category: E/M  Provider: Gaspar Cola, MD  DOB: 1956-04-25  DOS: 05/09/2022  Specialty: Interventional Pain Management  MRN: [id number]  Setting: Ambulatory outpatient  PCP: Rusty Aus, MD  Type: Established Patient    Referring Provider: Rusty Aus, MD  Location: Office  Delivery: Face-to-face     HPI  [name], a 66 y.o. year old male, is here today because of his No primary diagnosis found.. [name] primary complain today is No chief complaint on file. Last encounter: My last encounter with him was on 04/25/2022. Pertinent problems: 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 Pain Assessment: Severity of   is reported as a  /10. Location:    / . Onset:  . Quality:  . Timing:  . Modifying factor(s):  Marland Kitchen Vitals:  vitals were not taken for this visit.   Reason for encounter: post-procedure evaluation and assessment. ***  Post-procedure evaluation   Type: Cervical Facet Medial Branch Block(s) #1  Laterality: Bilateral  Level: C6, C7 & T1 Medial Branch Level(s). Laterality: Right  Level: C3, C4, & C5 Medial Branch Level(s).  Imaging: Fluoroscopic guidance Anesthesia: Local anesthesia (1-2% Lidocaine) Anxiolysis: IV Versed 4.0 mg Sedation: Moderate conscious sedation. DOS: 04/25/2022  Performed by: Gaspar Cola, MD  Purpose: Diagnostic/Therapeutic Indications: Cervicalgia (cervical spine axial pain) severe enough to impact quality of life or function. 1. Cervical facet syndrome   2. Spondylosis without myelopathy or radiculopathy, cervical region   3. Cervical facet hypertrophy (Multilevel) (Bilateral)   4. Cervicalgia (1ry area of Pain) (Right)   5. 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 DDD (degenerative disc disease), cervical   7. Neck pain of over 3 months duration   8. Abnormal MRI, cervical spine (09/28/2021)    NAS-11 Pain score:   Pre-procedure: 2 /10   Post-procedure: 0-No pain/10      Effectiveness:  Initial hour after procedure:   ***. Subsequent 4-6 hours post-procedure:   ***. Analgesia past initial 6 hours:   ***. Ongoing improvement:  Analgesic:  *** Function:    ***    ROM:    ***     Pharmacotherapy Assessment  Analgesic: None MME/day: 0 mg/day   Monitoring: [place] PMP: PDMP reviewed during this encounter.       Pharmacotherapy: No side-effects or adverse reactions reported. Compliance: No problems identified. Effectiveness: Clinically acceptable.  No notes on file  UDS:  No results found for:  "SUMMARY"   ROS  Constitutional: Denies any fever or chills Gastrointestinal: No reported hemesis, hematochezia, vomiting, or acute GI distress Musculoskeletal: Denies any acute onset joint swelling, redness, loss of ROM, or weakness Neurological: No reported episodes of acute onset apraxia, aphasia, dysarthria, agnosia, amnesia, paralysis, loss of coordination, or loss of consciousness  Medication Review  amitriptyline, butalbital-acetaminophen-caffeine, diclofenac, gabapentin, lidocaine, ondansetron, and prochlorperazine  History Review  Allergy: [name] is allergic to protonix [pantoprazole]. Drug: [name]  reports that he does not currently use drugs after having used the following drugs: Marijuana. Alcohol:  reports no history of alcohol use. Tobacco:  reports that he has quit smoking. His smoking use included cigarettes. He has never used smokeless tobacco. Social: [name]  reports that he has quit smoking. His smoking use included cigarettes. He has never used smokeless tobacco. He reports that he does not currently use drugs after having used the following drugs: Marijuana. He reports that he does not drink alcohol. Medical:  has a past medical history of Arthritis, Esophageal stricture, GERD (gastroesophageal reflux disease), History of MRSA infection (2016), Hypertension, Migraine, and Skin cancer, basal cell. Surgical: [name]  has a past surgical history that includes Ankle arthroplasty; Esophagogastroduodenoscopy (N/A, 11/04/2018); and Inguinal hernia repair (Left, 09/26/2019). Family: family history includes Cancer in his father and mother.  Laboratory Chemistry Profile   Renal Lab Results  Component Value Date   BUN 21 03/17/2022   CREATININE 0.80 03/17/2022   GFRAA >60 10/10/2019   GFRNONAA >60 03/17/2022    Hepatic Lab Results  Component Value Date   AST 20 03/17/2022   ALT 13 03/17/2022   ALBUMIN 4.7 03/17/2022   ALKPHOS 62 03/17/2022    LIPASE 39 03/17/2022    Electrolytes Lab Results  Component Value Date   NA 137 03/17/2022   K 4.3 03/17/2022   CL 105 03/17/2022   CALCIUM 9.3 03/17/2022   MG 2.1 03/11/2021    Bone No results found for: "VD25OH", "VD125OH2TOT", "LX7262MB5", "DH7416LA4", "25OHVITD1", "25OHVITD2", "25OHVITD3", "TESTOFREE", "TESTOSTERONE"  Inflammation (CRP: Acute Phase) (ESR: Chronic Phase) Lab Results  Component Value Date   LATICACIDVEN 0.8 10/10/2019         Note: Above Lab results reviewed.  Recent Imaging Review  DG PAIN CLINIC C-ARM 1-60 MIN NO REPORT Fluoro was used, but no Radiologist interpretation will be provided.  Please refer to "NOTES" tab for provider progress note. Note: Reviewed        Physical Exam  General appearance: Well nourished, well developed, and well hydrated. In no apparent acute distress Mental status: Alert, oriented x 3 (person, place, & time)       Respiratory: No evidence of acute respiratory distress Eyes: PERLA Vitals: There were no vitals taken for this visit. BMI: Estimated body mass index is 18.65 kg/m as calculated from the following:   Height as of 04/25/22: '5\' 10"'  (1.778 m).   Weight as of 04/25/22: 130 lb (59 kg). Ideal: Ideal body weight: 73 kg (160 lb 15 oz)  Assessment   Diagnosis Status  No diagnosis found. Controlled Controlled Controlled   Updated Problems: No problems updated.  Plan of Care  Problem-specific:  No problem-specific Assessment & Plan notes found for this encounter.  [name] has a current medication list which includes the following long-term medication(s): prochlorperazine.  Pharmacotherapy (Medications Ordered): No orders of the defined types were placed in this encounter.  Orders:  No orders of the  defined types were placed in this encounter.  Follow-up plan:   No follow-ups on file.     Interventional Therapies  Risk  Complexity Considerations:   Estimated body mass index is 18.65 kg/m as  calculated from the following:   Height as of this encounter: '5\' 10"'  (1.778 m).   Weight as of this encounter: 130 lb (59 kg). WNL   Planned  Pending:   Diagnostic right cervical facet C3-C7 MBB #1 & (B) C7 & T1 MBB #1    Under consideration:   Diagnostic right cervical facet C3-C7 MBB #1 & (B) C7 & T1 MBB #1    Completed:   Diagnostic right Cervical ESI x1 (03/23/2022) (DOS: 10-10/10) (0/0/0/0) (patient indicated having gone to the ED x4 since procedure.)   Completed by other providers:   Therapeutic right C3 and C4 medial branch RFA x1 (07/30/2020) by Sharlet Salina, DO [facility])  Diagnostic right C3 and C4 MBB x2 (06/30/2020, 07/15/2020) by Sharlet Salina, DO [facility])  Diagnostic right C3-4 zygapophyseal joint (facet joint) inj. x1 (03/05/2020) by Sharlet Salina, DO   Therapeutic  Palliative (PRN) options:   None established      Recent Visits Date Type Provider Dept  04/25/22 Procedure visit Milinda Pointer, MD [facility]  04/11/22 Office Visit Milinda Pointer, MD [facility]  03/23/22 Procedure visit Milinda Pointer, MD [facility]  02/15/22 Office Visit Milinda Pointer, MD [facility]  Showing recent visits within past 90 days and meeting all other requirements Future Appointments Date Type Provider Dept  05/09/22 Appointment Milinda Pointer, MD [facility]  Showing future appointments within next 90 days and meeting all other requirements  I discussed the assessment and treatment plan with the patient. The patient was provided an opportunity to ask questions and all were answered. The patient agreed with the plan and demonstrated an understanding of the instructions.  Patient advised to call back or seek an in-person evaluation if the symptoms or condition worsens.  Duration of encounter: *** minutes.  Total time on encounter, as per AMA guidelines included both the face-to-face and non-face-to-face  time personally spent by the physician and/or other qualified health care professional(s) on the day of the encounter (includes time in activities that require the physician or other qualified health care professional and does not include time in activities normally performed by clinical staff). Physician's time may include the following activities when performed: preparing to see the patient (eg, review of tests, pre-charting review of records) obtaining and/or reviewing separately obtained history performing a medically appropriate examination and/or evaluation counseling and educating the patient/family/caregiver ordering medications, tests, or procedures referring and communicating with other health care professionals (when not separately reported) documenting clinical information in the electronic or other health record independently interpreting results (not separately reported) and communicating results to the patient/ family/caregiver care coordination (not separately reported)  Note by: Gaspar Cola, MD Date: 05/09/2022; Time: 7:28 AM

## 2022-05-09 ENCOUNTER — Ambulatory Visit: Payer: No Typology Code available for payment source | Attending: Pain Medicine | Admitting: Pain Medicine

## 2022-05-09 ENCOUNTER — Ambulatory Visit: Payer: No Typology Code available for payment source | Admitting: Pain Medicine

## 2022-05-09 ENCOUNTER — Encounter: Payer: Self-pay | Admitting: Pain Medicine

## 2022-05-09 VITALS — BP 131/89 | HR 74 | Temp 97.2°F | Ht 70.0 in | Wt 130.0 lb

## 2022-05-09 DIAGNOSIS — G8929 Other chronic pain: Secondary | ICD-10-CM | POA: Diagnosis not present

## 2022-05-09 DIAGNOSIS — M47812 Spondylosis without myelopathy or radiculopathy, cervical region: Secondary | ICD-10-CM | POA: Insufficient documentation

## 2022-05-09 DIAGNOSIS — M549 Dorsalgia, unspecified: Secondary | ICD-10-CM | POA: Insufficient documentation

## 2022-05-09 DIAGNOSIS — M542 Cervicalgia: Secondary | ICD-10-CM | POA: Insufficient documentation

## 2022-05-09 DIAGNOSIS — G4486 Cervicogenic headache: Secondary | ICD-10-CM | POA: Insufficient documentation

## 2022-05-09 NOTE — Progress Notes (Signed)
Safety precautions to be maintained throughout the outpatient stay will include: orient to surroundings, keep bed in low position, maintain call bell within reach at all times, provide assistance with transfer out of bed and ambulation.  

## 2022-05-09 NOTE — Patient Instructions (Signed)
______________________________________________________________________  Preparing for Procedure with Sedation  NOTICE: Due to recent regulatory changes, starting on May 16, 2021, procedures requiring intravenous (IV) sedation will no longer be performed at the [facility].  These types of procedures are required to be performed at [facility] ambulatory surgery facility.  We are very sorry for the inconvenience.  Procedure appointments are limited to planned procedures: No Prescription Refills. No disability issues will be discussed. No medication changes will be discussed.  Instructions: Oral Intake: Do not eat or drink anything for at least 8 hours prior to your procedure. (Exception: Blood Pressure Medication. See below.) Transportation: A driver is required. You may not drive yourself after the procedure. Blood Pressure Medicine: Do not forget to take your blood pressure medicine with a sip of water the morning of the procedure. If your Diastolic (lower reading) is above 100 mmHg, elective cases will be cancelled/rescheduled. Blood thinners: These will need to be stopped for procedures. Notify our staff if you are taking any blood thinners. Depending on which one you take, there will be specific instructions on how and when to stop it. Diabetics on insulin: Notify the staff so that you can be scheduled 1st case in the morning. If your diabetes requires high dose insulin, take only  of your normal insulin dose the morning of the procedure and notify the staff that you have done so. Preventing infections: Shower with an antibacterial soap the morning of your procedure. Build-up your immune system: Take 1000 mg of Vitamin C with every meal (3 times a day) the day prior to your procedure. Antibiotics: Inform the staff if you have a condition or reason that requires you to take antibiotics before dental procedures. Pregnancy: If you are pregnant, call and cancel the procedure. Sickness: If  you have a cold, fever, or any active infections, call and cancel the procedure. Arrival: You must be in the facility at least 30 minutes prior to your scheduled procedure. Children: Do not bring children with you. Dress appropriately: There is always the possibility that your clothing may get soiled. Valuables: Do not bring any jewelry or valuables.  Reasons to call and reschedule or cancel your procedure: (Following these recommendations will minimize the risk of a serious complication.) Surgeries: Avoid having procedures within 2 weeks of any surgery. (Avoid for 2 weeks before or after any surgery). Flu Shots: Avoid having procedures within 2 weeks of a flu shots. (Avoid for 2 weeks before or after immunizations). Barium: Avoid having a procedure within 7-10 days after having had a radiological study involving the use of radiological contrast. (Myelograms, Barium swallow or enema study). Heart attacks: Avoid any elective procedures or surgeries for the initial 6 months after a "Myocardial Infarction" (Heart Attack). Blood thinners: It is imperative that you stop these medications before procedures. Let us know if you if you take any blood thinner.  Infection: Avoid procedures during or within two weeks of an infection (including chest colds or gastrointestinal problems). Symptoms associated with infections include: Localized redness, fever, chills, night sweats or profuse sweating, burning sensation when voiding, cough, congestion, stuffiness, runny nose, sore throat, diarrhea, nausea, vomiting, cold or Flu symptoms, recent or current infections. It is specially important if the infection is over the area that we intend to treat. Heart and lung problems: Symptoms that may suggest an active cardiopulmonary problem include: cough, chest pain, breathing difficulties or shortness of breath, dizziness, ankle swelling, uncontrolled high or unusually low blood pressure, and/or palpitations. If you are    experiencing any of these symptoms, cancel your procedure and contact your primary care physician for an evaluation.  Remember:  Regular Business hours are:  Monday to Thursday 8:00 AM to 4:00 PM  Provider's Schedule: Mayley Lish, MD:  Procedure days: Tuesday and Thursday 7:30 AM to 4:00 PM  Bilal Lateef, MD:  Procedure days: Monday and Wednesday 7:30 AM to 4:00 PM ______________________________________________________________________  ____________________________________________________________________________________________  General Risks and Possible Complications  Patient Responsibilities: It is important that you read this as it is part of your informed consent. It is our duty to inform you of the risks and possible complications associated with treatments offered to you. It is your responsibility as a patient to read this and to ask questions about anything that is not clear or that you believe was not covered in this document.  Patient's Rights: You have the right to refuse treatment. You also have the right to change your mind, even after initially having agreed to have the treatment done. However, under this last option, if you wait until the last second to change your mind, you may be charged for the materials used up to that point.  Introduction: Medicine is not an exact science. Everything in Medicine, including the lack of treatment(s), carries the potential for danger, harm, or loss (which is by definition: Risk). In Medicine, a complication is a secondary problem, condition, or disease that can aggravate an already existing one. All treatments carry the risk of possible complications. The fact that a side effects or complications occurs, does not imply that the treatment was conducted incorrectly. It must be clearly understood that these can happen even when everything is done following the highest safety standards.  No treatment: You can choose not to proceed with the  proposed treatment alternative. The "PRO(s)" would include: avoiding the risk of complications associated with the therapy. The "CON(s)" would include: not getting any of the treatment benefits. These benefits fall under one of three categories: diagnostic; therapeutic; and/or palliative. Diagnostic benefits include: getting information which can ultimately lead to improvement of the disease or symptom(s). Therapeutic benefits are those associated with the successful treatment of the disease. Finally, palliative benefits are those related to the decrease of the primary symptoms, without necessarily curing the condition (example: decreasing the pain from a flare-up of a chronic condition, such as incurable terminal cancer).  General Risks and Complications: These are associated to most interventional treatments. They can occur alone, or in combination. They fall under one of the following six (6) categories: no benefit or worsening of symptoms; bleeding; infection; nerve damage; allergic reactions; and/or death. No benefits or worsening of symptoms: In Medicine there are no guarantees, only probabilities. No healthcare provider can ever guarantee that a medical treatment will work, they can only state the probability that it may. Furthermore, there is always the possibility that the condition may worsen, either directly, or indirectly, as a consequence of the treatment. Bleeding: This is more common if the patient is taking a blood thinner, either prescription or over the counter (example: Goody Powders, Fish oil, Aspirin, Garlic, etc.), or if suffering a condition associated with impaired coagulation (example: Hemophilia, cirrhosis of the liver, low platelet counts, etc.). However, even if you do not have one on these, it can still happen. If you have any of these conditions, or take one of these drugs, make sure to notify your treating physician. Infection: This is more common in patients with a compromised  immune system, either due to disease (example:   diabetes, cancer, human immunodeficiency virus [HIV], etc.), or due to medications or treatments (example: therapies used to treat cancer and rheumatological diseases). However, even if you do not have one on these, it can still happen. If you have any of these conditions, or take one of these drugs, make sure to notify your treating physician. Nerve Damage: This is more common when the treatment is an invasive one, but it can also happen with the use of medications, such as those used in the treatment of cancer. The damage can occur to small secondary nerves, or to large primary ones, such as those in the spinal cord and brain. This damage may be temporary or permanent and it may lead to impairments that can range from temporary numbness to permanent paralysis and/or brain death. Allergic Reactions: Any time a substance or material comes in contact with our body, there is the possibility of an allergic reaction. These can range from a mild skin rash (contact dermatitis) to a severe systemic reaction (anaphylactic reaction), which can result in death. Death: In general, any medical intervention can result in death, most of the time due to an unforeseen complication. ____________________________________________________________________________________________  

## 2022-05-10 DIAGNOSIS — M4802 Spinal stenosis, cervical region: Secondary | ICD-10-CM | POA: Diagnosis not present

## 2022-05-16 DIAGNOSIS — I10 Essential (primary) hypertension: Secondary | ICD-10-CM | POA: Diagnosis not present

## 2022-05-16 DIAGNOSIS — M5010 Cervical disc disorder with radiculopathy, unspecified cervical region: Secondary | ICD-10-CM | POA: Diagnosis not present

## 2022-05-16 DIAGNOSIS — Z01810 Encounter for preprocedural cardiovascular examination: Secondary | ICD-10-CM | POA: Diagnosis not present

## 2022-05-18 ENCOUNTER — Encounter: Payer: Self-pay | Admitting: Emergency Medicine

## 2022-05-18 ENCOUNTER — Emergency Department
Admission: EM | Admit: 2022-05-18 | Discharge: 2022-05-18 | Disposition: A | Discharge status: Home / Self Care | Payer: No Typology Code available for payment source | Attending: Emergency Medicine | Admitting: Emergency Medicine

## 2022-05-18 DIAGNOSIS — R519 Headache, unspecified: Secondary | ICD-10-CM | POA: Insufficient documentation

## 2022-05-18 MED ORDER — DROPERIDOL 2.5 MG/ML IJ SOLN
2.5000 mg | Freq: Once | INTRAMUSCULAR | Status: AC
  Administered 2022-05-18: 2.5 mg via INTRAMUSCULAR
  Filled 2022-05-18: qty 2

## 2022-05-18 MED ORDER — ONDANSETRON 4 MG PO TBDP: 4.0000 mg | ORAL_TABLET | Freq: Once | ORAL | Status: DC

## 2022-05-18 MED ORDER — PROCHLORPERAZINE EDISYLATE 10 MG/2ML IJ SOLN
10.0000 mg | Freq: Once | INTRAMUSCULAR | Status: AC
  Administered 2022-05-18: 10 mg via INTRAMUSCULAR

## 2022-05-18 MED ORDER — BUTALBITAL-APAP-CAFFEINE 50-325-40 MG PO TABS
1.0000 | ORAL_TABLET | Freq: Once | ORAL | Status: AC
Start: 2022-05-18 — End: 2022-05-18
  Administered 2022-05-18: 1 via ORAL
  Filled 2022-05-18: qty 1

## 2022-05-18 MED ORDER — ONDANSETRON 4 MG PO TBDP
4.0000 mg | ORAL_TABLET | Freq: Once | ORAL | Status: AC
  Administered 2022-05-18: 4 mg via ORAL
  Filled 2022-05-18: qty 1

## 2022-05-18 MED ORDER — DIPHENHYDRAMINE HCL 50 MG/ML IJ SOLN
50.0000 mg | Freq: Once | INTRAMUSCULAR | Status: AC
Start: 2022-05-18 — End: 2022-05-18
  Administered 2022-05-18: 50 mg via INTRAMUSCULAR
  Filled 2022-05-18: qty 1

## 2022-05-18 MED ORDER — KETOROLAC TROMETHAMINE 60 MG/2ML IM SOLN
30.0000 mg | Freq: Once | INTRAMUSCULAR | Status: AC
  Administered 2022-05-18: 30 mg via INTRAMUSCULAR
  Filled 2022-05-18: qty 2

## 2022-05-18 NOTE — ED Triage Notes (Signed)
Pt c/o HA x2 days without relief of OTC medication. Hx/o migraines caused by chronic cervical and back pain. Pt receives pain injections and is scheduled for back surgery.

## 2022-05-18 NOTE — ED Notes (Signed)
Patient ambulated to and from bathroom with independent and steady gait. Pt noted to be breathing unlabored speaking in full sentences. Pt reports improvement in pain. Pt positioned self back in bed for comfort. Bed low and locked with side rails raised x1. Call bell in reach.

## 2022-05-18 NOTE — ED Notes (Signed)
Patient resting in bed free from sign of distress. Breathing unlabored speaking in full sentences with symmetric chest rise and fall. Bed low and locked with side rails raised x2. Call bell in reach and monitor in place.   

## 2022-05-18 NOTE — ED Notes (Signed)
Patient discharged at this time. Ambulated to lobby with independent and steady gait. Breathing unlabored speaking in full sentences. Verbalized understanding of all discharge, follow up, and medication teaching. Discharged homed with all belongings.   

## 2022-05-18 NOTE — ED Provider Notes (Signed)
[facility] Provider Note    Event Date/Time   First MD Initiated Contact with Patient 05/18/22 [phone]     (approximate)   History   Migraine   HPI  Johnathan Harvey is a 66 y.o. male who presents to the ED for evaluation of Migraine   I reviewed PCP visit from 8/1 where he was evaluated for preoperative clearance prior to surgical management of cervical stenosis (cervical fusion) and associated chronic headaches. I have seen this patient multiple times in the past month or 2 and recognize him, I familiar with him.  He presents to the ED for evaluation of 2 days of severe occipital headache.  He reports headache originating from his neck and occiput, radiating up over his head.  He reports his headache is typical for pain without any normal features beyond the severity not relieved with home medications.  He is requesting Toradol IM as this is worked for him in the past.  Denies any falls or syncope, vision changes, fever, weakness to extremities, trauma, recent illnesses or other novel features.  Physical Exam   Triage Vital Signs: ED Triage Vitals  Enc Vitals Group     BP 05/18/22 0158 (!) 170/107     Pulse Rate 05/18/22 0158 81     Resp 05/18/22 0158 20     Temp 05/18/22 0158 97.9 F (36.6 C)     Temp Source 05/18/22 0158 Oral     SpO2 05/18/22 0158 99 %     Weight 05/18/22 0153 130 lb 1.1 oz (59 kg)     Height 05/18/22 0153 '5\' 10"'$  (1.778 m)     Head Circumference --      Peak Flow --      Pain Score 05/18/22 0153 10     Pain Loc --      Pain Edu? --      Excl. in [place]? --     Most recent vital signs: Vitals:   05/18/22 0600 05/18/22 0630  BP: (!) 157/94 126/83  Pulse: 86 89  Resp:    Temp:    SpO2: 96% 96%    General: Awake, no distress.  Ambulatory with normal gait.  Wet rag over his shoulder and posterior neck. CV:  Good peripheral perfusion.  Resp:  Normal effort.  Abd:  No distention.  MSK:  No deformity noted.  Neuro:  No  focal deficits appreciated. Cranial nerves II through XII intact 5/5 strength and sensation in all 4 extremities Other:     ED Results / Procedures / Treatments   Labs (all labs ordered are listed, but only abnormal results are displayed) Labs Reviewed - No data to display  EKG   RADIOLOGY   Official radiology report(s): No results found.  PROCEDURES and INTERVENTIONS:  Procedures  Medications  diphenhydrAMINE (BENADRYL) injection 50 mg (50 mg Intramuscular Given 05/18/22 0213)  ketorolac (TORADOL) injection 30 mg (30 mg Intramuscular Given 05/18/22 0213)  butalbital-acetaminophen-caffeine (FIORICET) 50-325-40 MG per tablet 1 tablet (1 tablet Oral Given 05/18/22 0214)  ondansetron (ZOFRAN-ODT) disintegrating tablet 4 mg (4 mg Oral Given 05/18/22 0214)  prochlorperazine (COMPAZINE) injection 10 mg (10 mg Intramuscular Given 05/18/22 0506)  droperidol (INAPSINE) 2.5 MG/ML injection 2.5 mg (2.5 mg Intramuscular Given 05/18/22 0600)     IMPRESSION / MDM / ASSESSMENT AND PLAN / ED COURSE  I reviewed the triage vital signs and the nursing notes.  Differential diagnosis includes, but is not limited to, migraine headache, intracranial hemorrhage, stroke,  temporal arteritis  66 year old male with chronic headache disorder presents to the ED with recurrence of these headaches, without evidence of novel features, and suitable for outpatient management after resolution of symptoms after migraine cocktail.  He looks systemically well, although photophobic.  No signs of trauma, neurologic or vascular deficits.  Consider diagnostic and CNS imaging, but ultimately we decided on empiric migraine cocktail at his recommendation.  This improves of symptoms and he requests discharge.  No barriers to outpatient management.      FINAL CLINICAL IMPRESSION(S) / ED DIAGNOSES   Final diagnoses:  Bad headache     Rx / DC Orders   ED Discharge Orders     None        Note:  This document was  prepared using Dragon voice recognition software and may include unintentional dictation errors.   Vladimir Crofts, MD 05/18/22 [phone]

## 2022-05-24 ENCOUNTER — Telehealth: Payer: Self-pay

## 2022-05-24 NOTE — Progress Notes (Unsigned)
PROVIDER NOTE: Interpretation of information contained herein should be left to medically-trained personnel. Specific patient instructions are provided elsewhere under "Patient Instructions" section of medical record. This document was created in part using STT-dictation technology, any transcriptional errors that may result from this process are unintentional.  Patient: Johnathan Harvey Type: Established DOB: [birth year] MRN: [id number] PCP: Rusty Aus, MD  Service: Procedure DOS: 05/25/2022 Setting: Ambulatory Location: Ambulatory outpatient facility Delivery: Face-to-face Provider: Gaspar Cola, MD Specialty: Interventional Pain Management Specialty designation: 09 Location: Outpatient facility Ref. Prov.: Milinda Pointer, MD    Primary Reason for Visit: Interventional Pain Management Treatment. CC: No chief complaint on file.   Procedure:           Type: Cervical Facet Medial Branch Block(s) #2  Laterality: Bilateral  Level: C3, C4, C5, C6, & C7 Medial Branch Level(s). Injecting these levels blocks the C3-4, C4-5, C5-6, and C6-7 cervical facet joints.  Imaging: Fluoroscopic guidance Anesthesia: Local anesthesia (1-2% Lidocaine) Anxiolysis: None                 Sedation:                .  DOS: 05/25/2022  Performed by: Gaspar Cola, MD  Purpose: Diagnostic/Therapeutic Indications: Cervicalgia (cervical spine axial pain) severe enough to impact quality of life or function. No diagnosis found. NAS-11 Pain score:   Pre-procedure:  /10   Post-procedure:  /10     Position / Prep / Materials:  Position: Prone. Head in cradle. C-spine slightly flexed. Prep solution: DuraPrep (Iodine Povacrylex [0.7% available iodine] and Isopropyl Alcohol, 74% w/w) Prep Area: Posterior Cervico-thoracic Region. From occipital ridge to tip of scapula, and from shoulder to shoulder. Entire posterior and lateral neck surface. Materials:  Tray: Block Needle(s):  Type: Spinal   Gauge (G): 22"  Length: 3.5-in  Qty: 5  Pre-op H&P Assessment:  Johnathan Harvey is a 66 y.o. (year old), male patient, seen today for interventional treatment. He  has a past surgical history that includes Ankle arthroplasty; Esophagogastroduodenoscopy (N/A, 11/04/2018); and Inguinal hernia repair (Left, 09/26/2019). Johnathan Harvey currently has no medications in their medication list. His primarily concern today is the No chief complaint on file.  Initial Vital Signs:  Pulse/HCG Rate:    Temp:   Resp:   BP:   SpO2:    BMI: Estimated body mass index is 18.66 kg/m as calculated from the following:   Height as of 05/18/22: '5\' 10"'$  (1.778 m).   Weight as of 05/18/22: 130 lb 1.1 oz (59 kg).  Risk Assessment: Allergies: Reviewed. He is allergic to protonix [pantoprazole].  Allergy Precautions: None required Coagulopathies: Reviewed. None identified.  Blood-thinner therapy: None at this time Active Infection(s): Reviewed. None identified. Johnathan Harvey is afebrile  Site Confirmation: Johnathan Harvey was asked to confirm the procedure and laterality before marking the site Procedure checklist: Completed Consent: Before the procedure and under the influence of no sedative(s), amnesic(s), or anxiolytics, the patient was informed of the treatment options, risks and possible complications. To fulfill our ethical and legal obligations, as recommended by the American Medical Association's Code of Ethics, I have informed the patient of my clinical impression; the nature and purpose of the treatment or procedure; the risks, benefits, and possible complications of the intervention; the alternatives, including doing nothing; the risk(s) and benefit(s) of the alternative treatment(s) or procedure(s); and the risk(s) and benefit(s) of doing nothing. The patient was provided information about the general risks and possible complications  associated with the procedure. These may include, but are not limited to:  failure to achieve desired goals, infection, bleeding, organ or nerve damage, allergic reactions, paralysis, and death. In addition, the patient was informed of those risks and complications associated to Spine-related procedures, such as failure to decrease pain; infection (i.e.: Meningitis, epidural or intraspinal abscess); bleeding (i.e.: epidural hematoma, subarachnoid hemorrhage, or any other type of intraspinal or peri-dural bleeding); organ or nerve damage (i.e.: Any type of peripheral nerve, nerve root, or spinal cord injury) with subsequent damage to sensory, motor, and/or autonomic systems, resulting in permanent pain, numbness, and/or weakness of one or several areas of the body; allergic reactions; (i.e.: anaphylactic reaction); and/or death. Furthermore, the patient was informed of those risks and complications associated with the medications. These include, but are not limited to: allergic reactions (i.e.: anaphylactic or anaphylactoid reaction(s)); adrenal axis suppression; blood sugar elevation that in diabetics may result in ketoacidosis or comma; water retention that in patients with history of congestive heart failure may result in shortness of breath, pulmonary edema, and decompensation with resultant heart failure; weight gain; swelling or edema; medication-induced neural toxicity; particulate matter embolism and blood vessel occlusion with resultant organ, and/or nervous system infarction; and/or aseptic necrosis of one or more joints. Finally, the patient was informed that Medicine is not an exact science; therefore, there is also the possibility of unforeseen or unpredictable risks and/or possible complications that may result in a catastrophic outcome. The patient indicated having understood very clearly. We have given the patient no guarantees and we have made no promises. Enough time was given to the patient to ask questions, all of which were answered to the patient's satisfaction. Mr.  Pepitone has indicated that he wanted to continue with the procedure. Attestation: I, the ordering provider, attest that I have discussed with the patient the benefits, risks, side-effects, alternatives, likelihood of achieving goals, and potential problems during recovery for the procedure that I have provided informed consent. Date  Time: {CHL [facility]-PAIN TIME CHOICES:21018001}  Pre-Procedure Preparation:  Monitoring: As per clinic protocol. Respiration, ETCO2, SpO2, BP, heart rate and rhythm monitor placed and checked for adequate function Safety Precautions: Patient was assessed for positional comfort and pressure points before starting the procedure. Time-out: I initiated and conducted the "Time-out" before starting the procedure, as per protocol. The patient was asked to participate by confirming the accuracy of the "Time Out" information. Verification of the correct person, site, and procedure were performed and confirmed by me, the nursing staff, and the patient. "Time-out" conducted as per Joint Commission's Universal Protocol (UP.01.01.01). Time:    Description/Narrative of Procedure:          Laterality: Bilateral. The procedure was performed in identical fashion on both sides. Targeted Levels:  C3, C4, C5, C6, & C7 Medial Branch Level(s).  Rationale (medical necessity): procedure needed and proper for the diagnosis and/or treatment of the patient's medical symptoms and needs. Procedural Technique Safety Precautions: Aspiration looking for blood return was conducted prior to all injections. At no point did we inject any substances, as a needle was being advanced. No attempts were made at seeking any paresthesias. Safe injection practices and needle disposal techniques used. Medications properly checked for expiration dates. SDV (single dose vial) medications used. Description of the Procedure: Protocol guidelines were followed. The patient was assisted into a comfortable position. The  target area was identified and the area prepped in the usual manner. Skin & deeper tissues infiltrated with local anesthetic. Appropriate amount of  time allowed to pass for local anesthetics to take effect. The procedure needles were then advanced to the target area. Proper needle placement secured. Negative aspiration confirmed. Solution injected in intermittent fashion, asking for systemic symptoms every 0.5cc of injectate. The needles were then removed and the area cleansed, making sure to leave some of the prepping solution back to take advantage of its long term bactericidal properties.  Technical description of process:  C3 Medial Branch Nerve Block (MBB): The target area for the C3 dorsal medial articular branch is the lateral concave waist of the articular pillar of C3. Under fluoroscopic guidance, a Quincke needle was inserted until contact was made with os over the postero-lateral aspect of the articular pillar of C3 (target area). After negative aspiration for blood, 0.5 mL of the nerve block solution was injected without difficulty or complication. The needle was removed intact. C4 Medial Branch Nerve Block (MBB): The target area for the C4 dorsal medial articular branch is the lateral concave waist of the articular pillar of C4. Under fluoroscopic guidance, a Quincke needle was inserted until contact was made with os over the postero-lateral aspect of the articular pillar of C4 (target area). After negative aspiration for blood, 0.5 mL of the nerve block solution was injected without difficulty or complication. The needle was removed intact. C5 Medial Branch Nerve Block (MBB): The target area for the C5 dorsal medial articular branch is the lateral concave waist of the articular pillar of C5. Under fluoroscopic guidance, a Quincke needle was inserted until contact was made with os over the postero-lateral aspect of the articular pillar of C5 (target area). After negative aspiration for blood, 0.5  mL of the nerve block solution was injected without difficulty or complication. The needle was removed intact. C6 Medial Branch Nerve Block (MBB): The target area for the C6 dorsal medial articular branch is the lateral concave waist of the articular pillar of C6. Under fluoroscopic guidance, a Quincke needle was inserted until contact was made with os over the postero-lateral aspect of the articular pillar of C6 (target area). After negative aspiration for blood, 0.5 mL of the nerve block solution was injected without difficulty or complication. The needle was removed intact. C7 Medial Branch Nerve Block (MBB): The target for the C7 dorsal medial articular branch lies on the superior-medial tip of the C7 transverse process. Under fluoroscopic guidance, a Quincke needle was inserted until contact was made with os over the postero-lateral aspect of the articular pillar of C7 (target area). After negative aspiration for blood, 0.5 mL of the nerve block solution was injected without difficulty or complication. The needle was removed intact.  Once the entire procedure was completed, the treated area was cleaned, making sure to leave some of the prepping solution back to take advantage of its long term bactericidal properties.  Anatomy Reference Guide:       There were no vitals filed for this visit.   Start Time:   hrs. End Time:   hrs.  Imaging Guidance (Spinal):          Type of Imaging Technique: Fluoroscopy Guidance (Spinal) Indication(s): Assistance in needle guidance and placement for procedures requiring needle placement in or near specific anatomical locations not easily accessible without such assistance. Exposure Time: Please see nurses notes. Contrast: None used. Fluoroscopic Guidance: I was personally present during the use of fluoroscopy. "Tunnel Vision Technique" used to obtain the best possible view of the target area. Parallax error corrected before commencing the procedure.  "  Direction-depth-direction" technique used to introduce the needle under continuous pulsed fluoroscopy. Once target was reached, antero-posterior, oblique, and lateral fluoroscopic projection used confirm needle placement in all planes. Images permanently stored in EMR. Interpretation: No contrast injected. I personally interpreted the imaging intraoperatively. Adequate needle placement confirmed in multiple planes. Permanent images saved into the patient's record.  Post-operative Assessment:  Post-procedure Vital Signs:  Pulse/HCG Rate:    Temp:   Resp:   BP:   SpO2:    EBL: None  Complications: No immediate post-treatment complications observed by team, or reported by patient.  Note: The patient tolerated the entire procedure well. A repeat set of vitals were taken after the procedure and the patient was kept under observation following institutional policy, for this type of procedure. Post-procedural neurological assessment was performed, showing return to baseline, prior to discharge. The patient was provided with post-procedure discharge instructions, including a section on how to identify potential problems. Should any problems arise concerning this procedure, the patient was given instructions to immediately contact us, at any time, without hesitation. In any case, we plan to contact the patient by telephone for a follow-up status report regarding this interventional procedure.  Comments:  No additional relevant information.  Plan of Care  Orders:  No orders of the defined types were placed in this encounter.  Chronic Opioid Analgesic:  None MME/day: 0 mg/day   Medications ordered for procedure: No orders of the defined types were placed in this encounter.  Medications administered: Gerarda Fraction. Cavagnaro had no medications administered during this visit.  See the medical record for exact dosing, route, and time of administration.  Follow-up plan:   No follow-ups on file.        Interventional Therapies  Risk  Complexity Considerations:   Estimated body mass index is 18.65 kg/m as calculated from the following:   Height as of this encounter: '5\' 10"'$  (1.778 m).   Weight as of this encounter: 130 lb (59 kg). WNL   Planned  Pending:   Diagnostic right cervical facet C3-C7 MBB #2 & (B) C7 & T1 MBB #2    Under consideration:   Diagnostic right cervical facet C3-C7 MBB #2 & (B) C7 & T1 MBB #2    Completed:   Diagnostic right cervical facet C3-C7 MBB x1 (04/25/2022)  Diagnostic bilateral cervical facet C7 & T1 MBB x1 (04/25/2022)  Diagnostic right Cervical ESI x1 (03/23/2022) (DOS: 10-10/10) (0/0/0/0) (patient indicated having gone to the ED x4 since procedure.)   Completed by other providers:   Therapeutic right C3 and C4 medial branch RFA x1 (07/30/2020) by Sharlet Salina, DO [facility])  Diagnostic right C3 and C4 MBB x2 (06/30/2020, 07/15/2020) by Sharlet Salina, DO [facility])  Diagnostic right C3-4 zygapophyseal joint (facet joint) inj. x1 (03/05/2020) by Sharlet Salina, DO   Therapeutic  Palliative (PRN) options:   None established     Recent Visits Date Type Provider Dept  05/09/22 Office Visit Milinda Pointer, MD [facility]  04/25/22 Procedure visit Milinda Pointer, MD [facility]  04/11/22 Office Visit Milinda Pointer, MD [facility]  03/23/22 Procedure visit Milinda Pointer, MD [facility]  Showing recent visits within past 90 days and meeting all other requirements Future Appointments Date Type Provider Dept  05/25/22 Appointment Milinda Pointer, MD [facility]  Showing future appointments within next 90 days and meeting all other requirements  Disposition: Discharge home  Discharge (Date  Time): 05/25/2022;   hrs.   Primary Care  Physician: Rusty Aus, MD Location: [facility] Note by: Gaspar Cola, MD Date: 05/25/2022; Time: 7:01  AM  Disclaimer:  Medicine is not an Chief Strategy Officer. The only guarantee in medicine is that nothing is guaranteed. It is important to note that the decision to proceed with this intervention was based on the information collected from the patient. The Data and conclusions were drawn from the patient's questionnaire, the interview, and the physical examination. Because the information was provided in large part by the patient, it cannot be guaranteed that it has not been purposely or unconsciously manipulated. Every effort has been made to obtain as much relevant data as possible for this evaluation. It is important to note that the conclusions that lead to this procedure are derived in large part from the available data. Always take into account that the treatment will also be dependent on availability of resources and existing treatment guidelines, considered by other Pain Management Practitioners as being common knowledge and practice, at the time of the intervention. For Medico-Legal purposes, it is also important to point out that variation in procedural techniques and pharmacological choices are the acceptable norm. The indications, contraindications, technique, and results of the above procedure should only be interpreted and judged by a Board-Certified Interventional Pain Specialist with extensive familiarity and expertise in the same exact procedure and technique.

## 2022-05-25 ENCOUNTER — Ambulatory Visit: Payer: No Typology Code available for payment source | Attending: Pain Medicine | Admitting: Pain Medicine

## 2022-05-25 ENCOUNTER — Ambulatory Visit
Admission: RE | Admit: 2022-05-25 | Discharge: 2022-05-25 | Disposition: A | Discharge status: Home / Self Care | Payer: No Typology Code available for payment source | Source: Ambulatory Visit | Attending: Pain Medicine | Admitting: Pain Medicine

## 2022-05-25 ENCOUNTER — Other Ambulatory Visit: Payer: Self-pay

## 2022-05-25 ENCOUNTER — Encounter: Payer: Self-pay | Admitting: Pain Medicine

## 2022-05-25 VITALS — BP 156/82 | HR 71 | Temp 97.4°F | Resp 14 | Ht 70.0 in | Wt 130.0 lb

## 2022-05-25 DIAGNOSIS — M47812 Spondylosis without myelopathy or radiculopathy, cervical region: Secondary | ICD-10-CM | POA: Insufficient documentation

## 2022-05-25 DIAGNOSIS — M4312 Spondylolisthesis, cervical region: Secondary | ICD-10-CM | POA: Diagnosis not present

## 2022-05-25 DIAGNOSIS — M5030 Other cervical disc degeneration, unspecified cervical region: Secondary | ICD-10-CM | POA: Insufficient documentation

## 2022-05-25 DIAGNOSIS — G4486 Cervicogenic headache: Secondary | ICD-10-CM | POA: Diagnosis not present

## 2022-05-25 DIAGNOSIS — M542 Cervicalgia: Secondary | ICD-10-CM | POA: Insufficient documentation

## 2022-05-25 MED ORDER — DEXAMETHASONE SODIUM PHOSPHATE 10 MG/ML IJ SOLN
INTRAMUSCULAR | Status: AC
  Filled 2022-05-25: qty 1

## 2022-05-25 MED ORDER — TRIAMCINOLONE ACETONIDE 40 MG/ML IJ SUSP
INTRAMUSCULAR | Status: AC
  Filled 2022-05-25: qty 2

## 2022-05-25 MED ORDER — MIDAZOLAM HCL 5 MG/5ML IJ SOLN
INTRAMUSCULAR | Status: AC
  Filled 2022-05-25: qty 5

## 2022-05-25 MED ORDER — LIDOCAINE HCL 2 % IJ SOLN
INTRAMUSCULAR | Status: AC
  Filled 2022-05-25: qty 20

## 2022-05-25 MED ORDER — FENTANYL CITRATE (PF) 100 MCG/2ML IJ SOLN
25.0000 ug | INTRAMUSCULAR | Status: AC | PRN
  Administered 2022-05-25 (×2): 50 ug via INTRAVENOUS

## 2022-05-25 MED ORDER — ROPIVACAINE HCL 2 MG/ML IJ SOLN
18.0000 mL | Freq: Once | INTRAMUSCULAR | Status: AC
  Administered 2022-05-25: 18 mL via PERINEURAL

## 2022-05-25 MED ORDER — DEXAMETHASONE SODIUM PHOSPHATE 10 MG/ML IJ SOLN
20.0000 mg | Freq: Once | INTRAMUSCULAR | Status: AC
  Administered 2022-05-25: 20 mg

## 2022-05-25 MED ORDER — ROPIVACAINE HCL 2 MG/ML IJ SOLN
INTRAMUSCULAR | Status: AC
  Filled 2022-05-25: qty 20

## 2022-05-25 MED ORDER — MIDAZOLAM HCL 5 MG/5ML IJ SOLN
0.5000 mg | Freq: Once | INTRAMUSCULAR | Status: AC
  Administered 2022-05-25: 2 mg via INTRAVENOUS

## 2022-05-25 MED ORDER — LACTATED RINGERS IV SOLN: Freq: Once | INTRAVENOUS | Status: AC

## 2022-05-25 MED ORDER — LIDOCAINE HCL 2 % IJ SOLN
20.0000 mL | Freq: Once | INTRAMUSCULAR | Status: AC
  Administered 2022-05-25: 400 mg

## 2022-05-25 MED ORDER — FENTANYL CITRATE (PF) 100 MCG/2ML IJ SOLN
INTRAMUSCULAR | Status: AC
  Filled 2022-05-25: qty 2

## 2022-05-25 NOTE — Progress Notes (Signed)
Safety precautions to be maintained throughout the outpatient stay will include: orient to surroundings, keep bed in low position, maintain call bell within reach at all times, provide assistance with transfer out of bed and ambulation.  

## 2022-05-25 NOTE — Patient Instructions (Signed)

## 2022-05-26 ENCOUNTER — Telehealth: Payer: Self-pay

## 2022-05-26 NOTE — Telephone Encounter (Signed)
Post procedure phone call. Patient states he is doing well.  

## 2022-06-12 NOTE — Progress Notes (Unsigned)
PROVIDER NOTE: Information contained herein reflects review and annotations entered in association with encounter. Interpretation of such information and data should be left to medically-trained personnel. Information provided to patient can be located elsewhere in the medical record under "Patient Instructions". Document created using STT-dictation technology, any transcriptional errors that may result from process are unintentional.    Patient: Johnathan Harvey  Service Category: E/M  Provider: Gaspar Cola, MD  DOB: [birth year]  DOS: 06/13/2022  Referring Provider: Rusty Aus, MD  MRN: [id number]  Specialty: Interventional Pain Management  PCP: Rusty Aus, MD  Type: Established Patient  Setting: Ambulatory outpatient    Location: Office  Delivery: Face-to-face     HPI  Mr. Meyer Arora Caratachea, a 66 y.o. year old male, is here today because of his Cervical facet syndrome [M47.812]. Mr. Grotz's primary complain today is No chief complaint on file. Last encounter: My last encounter with him was on 05/25/2022. Pertinent problems: Mr. 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 Pain Assessment: Severity of   is reported as a  /10. Location:    / . Onset:  . Quality:  . Timing:  . Modifying factor(s):  Marland Kitchen Vitals:  vitals were not taken for this visit.   Reason for encounter: post-procedure evaluation and assessment. ***  Post-procedure evaluation   Type: Cervical Facet Medial Branch Block(s) #2  Laterality: Bilateral  Level: C6, C7 & T1 Medial Branch Level(s). Laterality: Right  Level: C3, C4, & C5 Medial Branch Level(s).  Imaging: Fluoroscopic guidance Anesthesia: Local anesthesia (1-2% Lidocaine) Anxiolysis: IV Versed 2.0 mg Sedation: Moderate Sedation              .  DOS: 05/25/2022  Performed by: Gaspar Cola, MD  Purpose: Diagnostic/Therapeutic Indications: Cervicalgia (cervical spine axial pain) severe enough to impact quality of life or function. 1. Cervical facet syndrome   2. Spondylosis without myelopathy or radiculopathy, cervical region   3. Cervical facet hypertrophy (Multilevel) (Bilateral)   4. 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 Cervicalgia (1ry area of Pain) (Right)   6. Cervicogenic headache (2ry area of Pain) (Right)   7. Cervical facet arthropathy (Multilevel) (Bilateral)   8. DDD (degenerative disc disease), cervical    NAS-11 Pain score:   Pre-procedure: 2 /10   Post-procedure: 0-No pain/10      Effectiveness:  Initial hour after procedure:   ***. Subsequent 4-6 hours post-procedure:   ***. Analgesia past initial 6 hours:   ***. Ongoing improvement:  Analgesic:  *** Function:    ***    ROM:    ***     Pharmacotherapy Assessment  Analgesic: None MME/day: 0 mg/day   Monitoring: [place] PMP: PDMP reviewed during this encounter.       Pharmacotherapy: No side-effects or adverse  reactions reported. Compliance: No problems identified. Effectiveness: Clinically acceptable.  No notes on file  No  results found for: "CBDTHCR" No results found for: "D8THCCBX" No results found for: "D9THCCBX"  UDS:  No results found for: "SUMMARY"    ROS  Constitutional: Denies any fever or chills Gastrointestinal: No reported hemesis, hematochezia, vomiting, or acute GI distress Musculoskeletal: Denies any acute onset joint swelling, redness, loss of ROM, or weakness Neurological: No reported episodes of acute onset apraxia, aphasia, dysarthria, agnosia, amnesia, paralysis, loss of coordination, or loss of consciousness  Medication Review     History Review  Allergy: Mr. Baig is allergic to protonix [pantoprazole]. Drug: Mr. Menta  reports that he does not currently use drugs after having used the following drugs: Marijuana. Alcohol:  reports no history of alcohol use. Tobacco:  reports that he has quit smoking. His smoking use included cigarettes. He has never used smokeless tobacco. Social: Mr. Gabbard  reports that he has quit smoking. His smoking use included cigarettes. He has never used smokeless tobacco. He reports that he does not currently use drugs after having used the following drugs: Marijuana. He reports that he does not drink alcohol. Medical:  has a past medical history of Arthritis, Esophageal stricture, GERD (gastroesophageal reflux disease), History of MRSA infection (2016), Hypertension, Migraine, and Skin cancer, basal cell. Surgical: Mr. Catlin  has a past surgical history that includes Ankle arthroplasty; Esophagogastroduodenoscopy (N/A, 11/04/2018); and Inguinal hernia repair (Left, 09/26/2019). Family: family history includes Cancer in his father and mother.  Laboratory Chemistry Profile   Renal Lab Results  Component Value Date   BUN 21 03/17/2022   CREATININE 0.80 03/17/2022   GFRAA >60 10/10/2019   GFRNONAA >60 03/17/2022    Hepatic Lab Results  Component Value Date   AST 20 03/17/2022   ALT 13 03/17/2022   ALBUMIN 4.7 03/17/2022   ALKPHOS 62  03/17/2022   LIPASE 39 03/17/2022    Electrolytes Lab Results  Component Value Date   NA 137 03/17/2022   K 4.3 03/17/2022   CL 105 03/17/2022   CALCIUM 9.3 03/17/2022   MG 2.1 03/11/2021    Bone No results found for: "VD25OH", "VD125OH2TOT", "JH4174YC1", "KG8185UD1", "25OHVITD1", "25OHVITD2", "25OHVITD3", "TESTOFREE", "TESTOSTERONE"  Inflammation (CRP: Acute Phase) (ESR: Chronic Phase) Lab Results  Component Value Date   LATICACIDVEN 0.8 10/10/2019         Note: Above Lab results reviewed.  Recent Imaging Review  DG PAIN CLINIC C-ARM 1-60 MIN NO REPORT Fluoro was used, but no Radiologist interpretation will be provided.  Please refer to "NOTES" tab for provider progress note. Note: Reviewed        Physical Exam  General appearance: Well nourished, well developed, and well hydrated. In no apparent acute distress Mental status: Alert, oriented x 3 (person, place, & time)       Respiratory: No evidence of acute respiratory distress Eyes: PERLA Vitals: There were no vitals taken for this visit. BMI: Estimated body mass index is 18.65 kg/m as calculated from the following:   Height as of 05/25/22: '5\' 10"'  (1.778 m).   Weight as of 05/25/22: 130 lb (59 kg). Ideal: Patient weight not recorded  Assessment   Diagnosis Status  1. Cervical facet syndrome   2. Chronic neck and back pain   3. Cervicogenic headache (2ry area of Pain) (Right)   4. Neck pain of over 3 months duration    Controlled Controlled Controlled   Updated Problems: No problems updated.  Plan of  Care  Problem-specific:  No problem-specific Assessment & Plan notes found for this encounter.  Mr. Dantrell Schertzer Fannin is not on any long-term medications.  Pharmacotherapy (Medications Ordered): No orders of the defined types were placed in this encounter.  Orders:  No orders of the defined types were placed in this encounter.  Follow-up plan:   No follow-ups on file.     Interventional Therapies   Risk  Complexity Considerations:   Estimated body mass index is 18.65 kg/m as calculated from the following:   Height as of this encounter: '5\' 10"'  (1.778 m).   Weight as of this encounter: 130 lb (59 kg). WNL   Planned  Pending:   Diagnostic right cervical facet C3-C7 MBB #2 & (B) C7 & T1 MBB #2    Under consideration:   Diagnostic right cervical facet C3-C7 MBB #2 & (B) C7 & T1 MBB #2    Completed:   Diagnostic right cervical facet C3-C7 MBB x1 (04/25/2022)  Diagnostic bilateral cervical facet C7 & T1 MBB x1 (04/25/2022)  Diagnostic right Cervical ESI x1 (03/23/2022) (DOS: 10-10/10) (0/0/0/0) (patient indicated having gone to the ED x4 since procedure.)   Completed by other providers:   Therapeutic right C3 and C4 medial branch RFA x1 (07/30/2020) by Sharlet Salina, DO [facility])  Diagnostic right C3 and C4 MBB x2 (06/30/2020, 07/15/2020) by Sharlet Salina, DO [facility] PM[facility])  Diagnostic right C3-4 zygapophyseal joint (facet joint) inj. x1 (03/05/2020) by Sharlet Salina, DO   Therapeutic  Palliative (PRN) options:   None established      Recent Visits Date Type Provider Dept  05/25/22 Procedure visit Milinda Pointer, MD [facility]  05/09/22 Office Visit Milinda Pointer, MD [facility]  04/25/22 Procedure visit Milinda Pointer, MD [facility]  04/11/22 Office Visit Milinda Pointer, MD [facility]  03/23/22 Procedure visit Milinda Pointer, MD [facility]  Showing recent visits within past 90 days and meeting all other requirements Future Appointments Date Type Provider Dept  06/13/22 Appointment Milinda Pointer, MD [facility]  Showing future appointments within next 90 days and meeting all other requirements  I discussed the assessment and treatment plan with the patient. The patient was provided an opportunity to ask questions and all were answered. The patient agreed with the plan and demonstrated an  understanding of the instructions.  Patient advised to call back or seek an in-person evaluation if the symptoms or condition worsens.  Duration of encounter: *** minutes.  Total time on encounter, as per AMA guidelines included both the face-to-face and non-face-to-face time personally spent by the physician and/or other qualified health care professional(s) on the day of the encounter (includes time in activities that require the physician or other qualified health care professional and does not include time in activities normally performed by clinical staff). Physician's time may include the following activities when performed: preparing to see the patient (eg, review of tests, pre-charting review of records) obtaining and/or reviewing separately obtained history performing a medically appropriate examination and/or evaluation counseling and educating the patient/family/caregiver ordering medications, tests, or procedures referring and communicating with other health care professionals (when not separately reported) documenting clinical information in the electronic or other health record independently interpreting results (not separately reported) and communicating results to the patient/ family/caregiver care coordination (not separately reported)  Note by: Gaspar Cola, MD Date: 06/13/2022; Time: 2:21 PM

## 2022-06-13 ENCOUNTER — Other Ambulatory Visit: Payer: Self-pay

## 2022-06-13 ENCOUNTER — Encounter: Payer: Self-pay | Admitting: Pain Medicine

## 2022-06-13 ENCOUNTER — Ambulatory Visit: Payer: No Typology Code available for payment source | Attending: Pain Medicine | Admitting: Pain Medicine

## 2022-06-13 VITALS — BP 134/86 | HR 72 | Temp 98.1°F | Resp 16 | Ht 70.0 in | Wt 130.0 lb

## 2022-06-13 DIAGNOSIS — M542 Cervicalgia: Secondary | ICD-10-CM | POA: Diagnosis not present

## 2022-06-13 DIAGNOSIS — G8929 Other chronic pain: Secondary | ICD-10-CM | POA: Diagnosis not present

## 2022-06-13 DIAGNOSIS — G4486 Cervicogenic headache: Secondary | ICD-10-CM | POA: Insufficient documentation

## 2022-06-13 DIAGNOSIS — M47812 Spondylosis without myelopathy or radiculopathy, cervical region: Secondary | ICD-10-CM | POA: Diagnosis not present

## 2022-06-13 DIAGNOSIS — M549 Dorsalgia, unspecified: Secondary | ICD-10-CM | POA: Diagnosis not present

## 2022-06-13 NOTE — Progress Notes (Signed)
Safety precautions to be maintained throughout the outpatient stay will include: orient to surroundings, keep bed in low position, maintain call bell within reach at all times, provide assistance with transfer out of bed and ambulation.  

## 2022-06-30 DIAGNOSIS — M4802 Spinal stenosis, cervical region: Secondary | ICD-10-CM | POA: Diagnosis not present

## 2022-07-04 DIAGNOSIS — M5412 Radiculopathy, cervical region: Secondary | ICD-10-CM | POA: Diagnosis not present

## 2022-07-04 DIAGNOSIS — M4802 Spinal stenosis, cervical region: Secondary | ICD-10-CM | POA: Diagnosis not present

## 2022-08-03 DIAGNOSIS — Z008 Encounter for other general examination: Secondary | ICD-10-CM | POA: Diagnosis not present

## 2022-08-03 DIAGNOSIS — F1021 Alcohol dependence, in remission: Secondary | ICD-10-CM | POA: Diagnosis not present

## 2022-08-03 DIAGNOSIS — M4692 Unspecified inflammatory spondylopathy, cervical region: Secondary | ICD-10-CM | POA: Diagnosis not present

## 2022-08-03 DIAGNOSIS — Z681 Body mass index (BMI) 19 or less, adult: Secondary | ICD-10-CM | POA: Diagnosis not present

## 2022-08-03 DIAGNOSIS — I10 Essential (primary) hypertension: Secondary | ICD-10-CM | POA: Diagnosis not present

## 2022-08-23 ENCOUNTER — Emergency Department
Admission: EM | Admit: 2022-08-23 | Discharge: 2022-08-23 | Disposition: A | Discharge status: Home / Self Care | Payer: No Typology Code available for payment source | Attending: Emergency Medicine | Admitting: Emergency Medicine

## 2022-08-23 ENCOUNTER — Other Ambulatory Visit: Payer: Self-pay

## 2022-08-23 ENCOUNTER — Encounter: Payer: Self-pay | Admitting: Emergency Medicine

## 2022-08-23 DIAGNOSIS — M545 Low back pain, unspecified: Secondary | ICD-10-CM | POA: Insufficient documentation

## 2022-08-23 DIAGNOSIS — R110 Nausea: Secondary | ICD-10-CM | POA: Insufficient documentation

## 2022-08-23 DIAGNOSIS — G8929 Other chronic pain: Secondary | ICD-10-CM | POA: Insufficient documentation

## 2022-08-23 DIAGNOSIS — I10 Essential (primary) hypertension: Secondary | ICD-10-CM | POA: Insufficient documentation

## 2022-08-23 DIAGNOSIS — M542 Cervicalgia: Secondary | ICD-10-CM | POA: Insufficient documentation

## 2022-08-23 MED ORDER — ONDANSETRON HCL 4 MG/2ML IJ SOLN
4.0000 mg | Freq: Once | INTRAMUSCULAR | Status: AC
  Administered 2022-08-23: 4 mg via INTRAVENOUS
  Filled 2022-08-23: qty 2

## 2022-08-23 MED ORDER — ONDANSETRON HCL 4 MG PO TABS: 4.0000 mg | ORAL_TABLET | Freq: Every day | ORAL | 0 refills | Status: DC | PRN

## 2022-08-23 MED ORDER — MORPHINE SULFATE (PF) 4 MG/ML IV SOLN
4.0000 mg | Freq: Once | INTRAVENOUS | Status: AC
  Administered 2022-08-23: 4 mg via INTRAVENOUS
  Filled 2022-08-23: qty 1

## 2022-08-23 NOTE — ED Notes (Signed)
First Nurse Note: Pt to ED via for back pain and vomiting. Pt states that he had back surgery a few weeks ago and was supposed to follow up with his surgeon this morning but was unable to make it to [facility] due to vomiting.

## 2022-08-23 NOTE — ED Triage Notes (Signed)
Patient to ED for back pain- patient states he had back surgery for 10/19. Patient states he was suppose to follow up with surgery today due to lack of pain control. Patient states he has been vomiting since yesterday due to pain. Patient is currently out of pain medication- took last dose yesterday. Patient states he did not feel like he could make it to appointment in [place] today.

## 2022-08-23 NOTE — Discharge Instructions (Signed)
Please follow-up with your surgeon today as you have scheduled.  You may take the Zofran as prescribed for your nausea, do not take more than prescribed given the risk of abnormal heart rhythms.  Please return for any new, worsening, or change in symptoms or other concerns.

## 2022-08-23 NOTE — ED Provider Notes (Signed)
[facility] Provider Note    Event Date/Time   First MD Initiated Contact with Patient 08/23/22 0827     (approximate)   History   Back Pain   HPI  Johnathan Harvey is a 66 y.o. male with a past medical history of cervical facet hypertrophy, grade 1 anterolisthesis of cervical spine C3-C4 and C7-T1, degenerative disc disease, cervical foraminal stenosis, cervicogenic headache, cervical facet syndrome, spondylosis without myelopathy or radiculopathy of the cervical region, chronic pain syndrome who presents today for evaluation of neck pain.  Patient reports that he had surgery on 10/19 and was supposed to follow-up with surgery today but canceled because he did not think that he could make it to [place].  Patient reports that he has felt intermittently nauseous from the pain for the past several weeks.  He does not think that it is any different today.  He has been wearing his cervical collar, and his neck pain feels the same as it has been.  He denies any new paresthesias or weakness in his arms or legs.  No urinary or fecal incontinence or retention.  Denies saddle anesthesia.  He has not had any fevers.  He denies any abdominal pain.  He reports that he needs to get his nausea and his pain under control so that he can go to his appointment today.  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) (Right: C4-5) (Left: C5-6) 02/15/2022   Cervicogenic headache (2ry area of Pain) (Right) 02/15/2022   Cervical facet syndrome 02/15/2022   Spondylosis without myelopathy or radiculopathy, cervical region 02/15/2022   History of  abnormal drug screen 02/15/2022   Occipital headache (Right) 02/15/2022   Neck pain of over 3 months duration 02/15/2022   Cervical neck pain with evidence of disc disease 02/15/2022   Chronic neck and back pain 02/15/2022   Musculoskeletal disorder involving upper trapezius muscle (Right) 02/15/2022   Gout 02/14/2022   History of marijuana use 02/14/2022   Abnormal MRI, cervical spine (09/28/2021) 02/14/2022   Chronic pain syndrome 02/14/2022   Pharmacologic therapy 02/14/2022   Disorder of skeletal system 02/14/2022   Problems influencing health status 02/14/2022   Basal cell carcinoma 02/07/2022   Changing skin lesion 11/25/2021   Cervical disc disease 11/24/2021   History of hepatitis C 11/24/2021   Medicare annual wellness visit, initial 11/24/2021   Emesis 07/02/2021   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   Cervical myofascial pain syndrome 03/30/2021   Chronic tension-type headache, intractable 03/30/2021   Septic prepatellar bursitis of knee (Left) 09/07/2020   Headache disorder 09/21/2019   Cervicalgia (1ry area of Pain) (Right) 09/21/2019   History of narcotic abuse ([place]) 08/01/2016    Class: History of   Chest pain 07/29/2016   Cellulitis and abscess of leg 07/28/2015   GERD (gastroesophageal reflux disease) 07/28/2015   Arthritis 07/28/2015   Gastroesophageal reflux disease without esophagitis 07/28/2015   History of opiate overdose ([place]) 05/02/2015    Class: History of   Intestinal metaplasia of gastric mucosa 12/25/2014   Hypertension 07/30/2013   Dysphagia 07/18/2013   Esophageal stricture 07/18/2013          Physical Exam   Triage Vital Signs: ED  Triage Vitals [08/23/22 0820]  Enc Vitals Group     BP (!) 169/112     Pulse Rate 83     Resp 18     Temp 97.8 F (36.6 C)     Temp Source Oral     SpO2 99 %     Weight 130 lb (59 kg)     Height '5\' 10"'$  (1.778 m)     Head Circumference      Peak Flow      Pain Score 8     Pain Loc      Pain  Edu?      Excl. in [place]?     Most recent vital signs: Vitals:   08/23/22 0820  BP: (!) 169/112  Pulse: 83  Resp: 18  Temp: 97.8 F (36.6 C)  SpO2: 99%    Physical Exam Vitals and nursing note reviewed.  Constitutional:      General: Awake and alert. No acute distress.    Appearance: Normal appearance. The patient is normal weight.  HENT:     Head: Normocephalic and atraumatic.     Mouth: Mucous membranes are moist.  Eyes:     General: PERRL. Normal EOMs        Right eye: No discharge.        Left eye: No discharge.     Conjunctiva/sclera: Conjunctivae normal.  Cardiovascular:     Rate and Rhythm: Normal rate and regular rhythm.     Pulses: Normal pulses.     Heart sounds: Normal heart sounds Pulmonary:     Effort: Pulmonary effort is normal. No respiratory distress.     Breath sounds: Normal breath sounds.  Abdominal:     Abdomen is soft. There is no abdominal tenderness. No rebound or guarding. No distention. Musculoskeletal:        General: No swelling. Normal range of motion.     Cervical back: In cervical collar.  Normal strength and sensation in bilateral upper extremities. Normal grip strength bilaterally.  Normal intrinsic muscle function of the hand bilaterally.  Normal radial pulses bilaterally.  Negative Hoffmann sign No midline vertebral thoracic or lumbar spinal tenderness.  Normal strength and sensation of bilateral lower extremities Skin:    General: Skin is warm and dry.     Capillary Refill: Capillary refill takes less than 2 seconds.     Findings: No rash.  Neurological:     Mental Status: The patient is awake and alert.      ED Results / Procedures / Treatments   Labs (all labs ordered are listed, but only abnormal results are displayed) Labs Reviewed - No data to display   EKG     RADIOLOGY     PROCEDURES:  Critical Care performed:   Procedures   MEDICATIONS ORDERED IN ED: Medications  ondansetron (ZOFRAN) injection 4 mg (4  mg Intravenous Given 08/23/22 0850)  morphine (PF) 4 MG/ML injection 4 mg (4 mg Intravenous Given 08/23/22 0851)     IMPRESSION / MDM / [place] / ED COURSE  I reviewed the triage vital signs and the nursing notes.   Differential diagnosis includes, but is not limited to, acute on chronic pain, postsurgical pain, cervical radiculopathy.  Patient is awake and alert, hemodynamically stable and afebrile.  He denies any fevers or constitutional/infectious type symptoms to suggest post operative infection or epidural abscess.  He has normal strength and sensation in bilateral upper extremities, normal grip strength, do not suspect central cord  syndrome or nerve injury.  His symptoms have been ongoing for the past several weeks.  No abdominal pain to suggest intra-abdominal abnormality.  He reports that he always has nausea and vomiting when he has pain, and feels that this is not anything different or new for him today.  No signs or symptoms of cord compression at this time.  Negative Hoffmann sign bilaterally.  He follows with a pain management doctor, I advised him that I cannot prescribe him any further narcotics and he reports that he understands.  I encouraged him to follow-up with his surgeon as that is who can help him best.  I agreed to get his pain and nausea under control in the emergency department so that he may proceed to see his surgeon.  He has a ride. Patient was treated symptomatically in the emergency department with morphine and Zofran with significant improvement of his symptoms.  He reports that he is attempting to reschedule his appointment with the surgeon for this afternoon and plans to go.  We discussed the importance of close outpatient follow-up with the surgeon, as well as return precautions which were discussed at length.  Patient understands and agrees with plan.  He was discharged in stable condition.  He requested a prescription with her for the Zofran which was  prescribed.  He was advised to take this as prescribed but not more than prescribed.  He understands and agrees with plan.   Patient's presentation is most consistent with exacerbation of chronic illness.   Clinical Course as of 08/23/22 1141  Wed Aug 23, 2022  0943 Patient reports that he fell significantly improved.  He is planning to see his surgeon this afternoon [JP]    Clinical Course User Index [JP] Alayshia Marini, Clarnce Flock, PA-C     FINAL CLINICAL IMPRESSION(S) / ED DIAGNOSES   Final diagnoses:  Chronic neck pain  Nausea     Rx / DC Orders   ED Discharge Orders          Ordered    ondansetron (ZOFRAN) 4 MG tablet  Daily PRN        08/23/22 0944             Note:  This document was prepared using Dragon voice recognition software and may include unintentional dictation errors.   Emeline Gins 08/23/22 1141    Harvest Dark, MD 08/23/22 1453

## 2022-08-25 DIAGNOSIS — M4802 Spinal stenosis, cervical region: Secondary | ICD-10-CM | POA: Diagnosis not present

## 2022-09-01 ENCOUNTER — Other Ambulatory Visit: Payer: Self-pay

## 2022-09-01 ENCOUNTER — Emergency Department: Payer: No Typology Code available for payment source

## 2022-09-01 ENCOUNTER — Emergency Department
Admission: EM | Admit: 2022-09-01 | Discharge: 2022-09-01 | Disposition: A | Discharge status: Home / Self Care | Payer: No Typology Code available for payment source | Attending: Emergency Medicine | Admitting: Emergency Medicine

## 2022-09-01 DIAGNOSIS — R519 Headache, unspecified: Secondary | ICD-10-CM | POA: Diagnosis not present

## 2022-09-01 DIAGNOSIS — M542 Cervicalgia: Secondary | ICD-10-CM | POA: Insufficient documentation

## 2022-09-01 DIAGNOSIS — I10 Essential (primary) hypertension: Secondary | ICD-10-CM | POA: Insufficient documentation

## 2022-09-01 DIAGNOSIS — G43809 Other migraine, not intractable, without status migrainosus: Secondary | ICD-10-CM | POA: Diagnosis not present

## 2022-09-01 MED ORDER — KETOROLAC TROMETHAMINE 15 MG/ML IJ SOLN
15.0000 mg | Freq: Once | INTRAMUSCULAR | Status: AC
  Administered 2022-09-01: 15 mg via INTRAVENOUS
  Filled 2022-09-01: qty 1

## 2022-09-01 MED ORDER — ONDANSETRON HCL 4 MG PO TABS: 4.0000 mg | ORAL_TABLET | Freq: Every day | ORAL | 1 refills | Status: DC | PRN

## 2022-09-01 MED ORDER — DIPHENHYDRAMINE HCL 50 MG/ML IJ SOLN
25.0000 mg | Freq: Once | INTRAMUSCULAR | Status: AC
  Administered 2022-09-01: 25 mg via INTRAVENOUS
  Filled 2022-09-01: qty 1

## 2022-09-01 MED ORDER — METOCLOPRAMIDE HCL 5 MG/ML IJ SOLN
10.0000 mg | Freq: Once | INTRAMUSCULAR | Status: AC
  Administered 2022-09-01: 10 mg via INTRAVENOUS
  Filled 2022-09-01: qty 2

## 2022-09-01 MED ORDER — ACETAMINOPHEN 500 MG PO TABS
1000.0000 mg | ORAL_TABLET | Freq: Once | ORAL | Status: AC
  Administered 2022-09-01: 1000 mg via ORAL
  Filled 2022-09-01: qty 2

## 2022-09-01 MED ORDER — SODIUM CHLORIDE 0.9 % IV BOLUS
1000.0000 mL | Freq: Once | INTRAVENOUS | Status: AC
  Administered 2022-09-01: 1000 mL via INTRAVENOUS

## 2022-09-01 NOTE — ED Provider Notes (Addendum)
[facility] Provider Note    Event Date/Time   First MD Initiated Contact with Patient 09/01/22 1112     (approximate)   History   Migraine   HPI  Johnathan Harvey is a 66 y.o. male   Past medical history of cervical spine facet hypertrophy, anterolisthesis, degenerative disc disease, foraminal stenosis cervicogenic headache who had surgery in October on his neck, also history of chronic pain and migraines who presents emergency department with a typical migraine left-sided headache for the last 2 days that is consistent with migraines in the past, but a little bit worse in severity.  Gradual onset.  No trauma.  No motor or sensory changes.  Said he normally takes Percocet for his neck pain and headache but ran out.  He says they do not work so does not want a refill for narcotic pain medications.  He says that Zofran works for his nausea, which often accompanies his migraine headaches.  History was obtained via patient. I also reviewed an external medical note including a emergency department visit dated 08/23/2022 when he was evaluated for neck pain.      Physical Exam   Triage Vital Signs: ED Triage Vitals [09/01/22 1058]  Enc Vitals Group     BP (!) 159/101     Pulse Rate 79     Resp 17     Temp 98 F (36.7 C)     Temp src      SpO2 97 %     Weight      Height      Head Circumference      Peak Flow      Pain Score 9     Pain Loc      Pain Edu?      Excl. in [place]?     Most recent vital signs: Vitals:   09/01/22 1058  BP: (!) 159/101  Pulse: 79  Resp: 17  Temp: 98 F (36.7 C)  SpO2: 97%    General: Awake, no distress.  Cervical collar in place from prior surgery. CV:  Good peripheral perfusion.  Resp:  Normal effort.  Abd:  No distention.  Other:  Extraocular movements intact, equal round and reactive pupils, motor and sensory exam is normal, hemodynamics reviewed and other than some hypertension 150s over 100 he is  hemodynamically well, no fever.  Appears nontoxic and comfortable.   ED Results / Procedures / Treatments   Labs (all labs ordered are listed, but only abnormal results are displayed) Labs Reviewed - No data to display    RADIOLOGY I independently reviewed and interpreted CT of the head and see no obvious bleeding or midline shift.   PROCEDURES:  Critical Care performed: No  Procedures   MEDICATIONS ORDERED IN ED: Medications  acetaminophen (TYLENOL) tablet 1,000 mg (has no administration in time range)  ketorolac (TORADOL) 15 MG/ML injection 15 mg (has no administration in time range)  metoCLOPramide (REGLAN) injection 10 mg (10 mg Intravenous Given 09/01/22 1150)  diphenhydrAMINE (BENADRYL) injection 25 mg (25 mg Intravenous Given 09/01/22 1150)  sodium chloride 0.9 % bolus 1,000 mL (1,000 mLs Intravenous New Bag/Given 09/01/22 1149)    IMPRESSION / MDM / ASSESSMENT AND PLAN / ED COURSE  I reviewed the triage vital signs and the nursing notes.                              Differential diagnosis  includes, but is not limited to, migraine headache, intracranial bleeding, CVA, less likely infectious etiologies.    MDM: Typical migraine headache that he says is normally responsive to emergency department migraine cocktail.  His neck has been otherwise feeling well after his surgery, I doubt this is any complication from his neck surgery, given his overall well appearance and no infectious symptoms I doubt this is meningitis or infectious etiology, gradual onset typical migraine doubt intracranial bleeding but will check a CT head which fortunately was negative for bleeding.  Defer LP at this time given low suspicion of SAH.  Treat migraine with a cocktail including IV Reglan, IV Toradol, IV crystalloids, IV Benadryl.  Reassess, and anticipate discharge if pain improves.   Headache resolved.  Safe for discharge.  PMD follow-up and return precautions given.  Patient's  presentation is most consistent with acute presentation with potential threat to life or bodily function.       FINAL CLINICAL IMPRESSION(S) / ED DIAGNOSES   Final diagnoses:  Other migraine without status migrainosus, not intractable     Rx / DC Orders   ED Discharge Orders          Ordered    ondansetron (ZOFRAN) 4 MG tablet  Daily PRN        09/01/22 1153             Note:  This document was prepared using Dragon voice recognition software and may include unintentional dictation errors.    Lucillie Garfinkel, MD 09/01/22 1153    Lucillie Garfinkel, MD 09/01/22 [id number]

## 2022-09-01 NOTE — ED Triage Notes (Addendum)
Pt comes with c/o migraine that started few days ago and has been constant and gotten worse. Pt states it is now making him sick on his stomach. Pt states vomiting episode on way here to ED.  Pt states he took a perocet this am but no relief with that.

## 2022-09-01 NOTE — ED Notes (Signed)
Patient declined discharge vital signs. 

## 2022-09-01 NOTE — Discharge Instructions (Addendum)
Take tylenol 650 mg every 6 hours for headache and zofran as prescribed for nausea.   Thank you for choosing [place] for your health care today!  Please see your primary doctor this week for a follow up appointment.   If you do not have a primary doctor call the following clinics to establish care:  If you have insurance:  [facility] [id number] [place] [address]   [facility]  [phone] [address] [place] [address]   If you do not have insurance:  [facility]  [phone] [address] [place] [address]  Sometimes, in the early stages of certain disease courses it is difficult to detect in the emergency department evaluation -- so, it is important that you continue to monitor your symptoms and call your doctor right away or return to the emergency department if you develop any new or worsening symptoms.  It was my pleasure to care for you today.   Hoover Brunette Jacelyn Grip, MD

## 2022-09-01 NOTE — ED Notes (Signed)
Patient ambulated to hallway bathroom with a steady gait. 

## 2022-09-01 NOTE — ED Notes (Signed)
Pt not in room, pt in CT.  

## 2022-09-01 NOTE — ED Notes (Signed)
Back from CT, EDP at [facility], pt alert, NAD, calm, interactive, speech clear.

## 2022-09-03 ENCOUNTER — Emergency Department
Admission: EM | Admit: 2022-09-03 | Discharge: 2022-09-03 | Disposition: A | Discharge status: Home / Self Care | Payer: No Typology Code available for payment source | Attending: Emergency Medicine | Admitting: Emergency Medicine

## 2022-09-03 ENCOUNTER — Other Ambulatory Visit: Payer: Self-pay

## 2022-09-03 DIAGNOSIS — R519 Headache, unspecified: Secondary | ICD-10-CM | POA: Diagnosis not present

## 2022-09-03 DIAGNOSIS — Z85828 Personal history of other malignant neoplasm of skin: Secondary | ICD-10-CM | POA: Diagnosis not present

## 2022-09-03 DIAGNOSIS — I10 Essential (primary) hypertension: Secondary | ICD-10-CM | POA: Insufficient documentation

## 2022-09-03 MED ORDER — METOCLOPRAMIDE HCL 5 MG/ML IJ SOLN
10.0000 mg | Freq: Once | INTRAMUSCULAR | Status: AC
  Administered 2022-09-03: 10 mg via INTRAVENOUS
  Filled 2022-09-03: qty 2

## 2022-09-03 MED ORDER — KETOROLAC TROMETHAMINE 15 MG/ML IJ SOLN
15.0000 mg | Freq: Once | INTRAMUSCULAR | Status: AC
  Administered 2022-09-03: 15 mg via INTRAVENOUS
  Filled 2022-09-03: qty 1

## 2022-09-03 MED ORDER — ONDANSETRON HCL 4 MG/2ML IJ SOLN
4.0000 mg | Freq: Once | INTRAMUSCULAR | Status: AC | PRN
  Administered 2022-09-03: 4 mg via INTRAVENOUS
  Filled 2022-09-03: qty 2

## 2022-09-03 MED ORDER — SODIUM CHLORIDE 0.9 % IV BOLUS
1000.0000 mL | Freq: Once | INTRAVENOUS | Status: AC
  Administered 2022-09-03: 1000 mL via INTRAVENOUS

## 2022-09-03 NOTE — ED Triage Notes (Signed)
Pt to ED for migraine that started last night. +emesis. States was here a few days ago for migraine cocktail. Goes to pain clinic. Took advil this am.

## 2022-09-03 NOTE — Discharge Instructions (Signed)
Please follow-up with your outpatient provider tomorrow as you have scheduled.  Please return for any new, worsening, or change in symptoms or other concerns.  It was a pleasure caring for you today.

## 2022-09-03 NOTE — Progress Notes (Unsigned)
PROVIDER NOTE: Information contained herein reflects review and annotations entered in association with encounter. Interpretation of such information and data should be left to medically-trained personnel. Information provided to patient can be located elsewhere in the medical record under "Patient Instructions". Document created using STT-dictation technology, any transcriptional errors that may result from process are unintentional.    Patient: Johnathan Harvey  Service Category: E/M  Provider: Gaspar Cola, MD  DOB: 04-26-1956  DOS: 09/04/2022  Referring Provider: Rusty Aus, MD  MRN: [id number]  Specialty: Interventional Pain Management  PCP: Johnathan Aus, MD  Type: Established Patient  Setting: Ambulatory outpatient    Location: Office  Delivery: Face-to-face     HPI  Johnathan Harvey, a 66 y.o. year old male, is here today because of his No primary diagnosis found.. Johnathan Harvey's primary complain today is No chief complaint on file. Last encounter: My last encounter with him was on 06/13/2022. 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 Pain Assessment: Severity of   is reported as a  /10. Location:    / . Onset:  . Quality:  . Timing:  . Modifying factor(s):  Marland Kitchen Vitals:  vitals were not taken for this visit.   Reason for encounter:  *** . ***  Pharmacotherapy Assessment  Analgesic: None MME/day: 0 mg/day   Monitoring: [place] PMP: PDMP reviewed during this encounter.       Pharmacotherapy: No side-effects or adverse reactions reported. Compliance: No problems identified. Effectiveness: Clinically acceptable.  No notes on file  No results found for: "CBDTHCR" No results found for: "D8THCCBX" No results found for: "D9THCCBX"  UDS:  No results found for: "SUMMARY"    ROS  Constitutional: Denies any fever or chills Gastrointestinal: No reported hemesis, hematochezia, vomiting, or acute GI distress Musculoskeletal: Denies any acute onset joint swelling, redness, loss of ROM, or weakness Neurological: No reported episodes of acute onset apraxia, aphasia, dysarthria, agnosia, amnesia, paralysis, loss of coordination, or loss of consciousness  Medication Review  PRESCRIPTION MEDICATION and ondansetron  History Review  Allergy: Johnathan Harvey is allergic to protonix [pantoprazole]. Drug: Johnathan Harvey  reports that he does not currently use drugs after having used the following drugs: Marijuana. Alcohol:  reports no history of alcohol use. Tobacco:  reports that he has quit smoking. His smoking use included cigarettes. He has never used smokeless tobacco. Social: [name]  reports that he has quit smoking. His smoking use included cigarettes. He has never used smokeless tobacco. He reports that he does not currently use drugs after having used the following drugs: Marijuana. He reports that he does not drink alcohol. Medical:  has a past medical history of Arthritis, Esophageal stricture, GERD (gastroesophageal reflux  disease), History of MRSA infection (2016), Hypertension, Migraine, and Skin cancer, basal cell.  Surgical: Johnathan Harvey  has a past surgical history that includes Ankle arthroplasty; Esophagogastroduodenoscopy (N/A, 11/04/2018); and Inguinal hernia repair (Left, 09/26/2019). Family: family history includes Cancer in his father and mother.  Laboratory Chemistry Profile   Renal Lab Results  Component Value Date   BUN 21 03/17/2022   CREATININE 0.80 03/17/2022   GFRAA >60 10/10/2019   GFRNONAA >60 03/17/2022    Hepatic Lab Results  Component Value Date   AST 20 03/17/2022   ALT 13 03/17/2022   ALBUMIN 4.7 03/17/2022   ALKPHOS 62 03/17/2022   LIPASE 39 03/17/2022    Electrolytes Lab Results  Component Value Date   NA 137 03/17/2022   K 4.3 03/17/2022   CL 105 03/17/2022   CALCIUM 9.3 03/17/2022   MG 2.1 03/11/2021    Bone No results found for: "VD25OH", "VD125OH2TOT", "MB8466ZL9", "JT7017BL3", "25OHVITD1", "25OHVITD2", "25OHVITD3", "TESTOFREE", "TESTOSTERONE"  Inflammation (CRP: Acute Phase) (ESR: Chronic Phase) Lab Results  Component Value Date   LATICACIDVEN 0.8 10/10/2019         Note: Above Lab results reviewed.  Recent Imaging Review  CT Head Wo Contrast CLINICAL DATA:  Headache.  EXAM: CT HEAD WITHOUT CONTRAST  TECHNIQUE: Contiguous axial images were obtained from the base of the skull through the vertex without intravenous contrast.  RADIATION DOSE REDUCTION: This exam was performed according to the departmental dose-optimization program which includes automated exposure control, adjustment of the mA and/or kV according to patient size and/or use of iterative reconstruction technique.  COMPARISON:  CT examination dated March 17, 2022  FINDINGS: Brain: No evidence of acute infarction, hemorrhage, hydrocephalus, extra-axial collection or mass lesion/mass effect.  Vascular: No hyperdense vessel or unexpected calcification.  Skull: Normal. Negative for  fracture or focal lesion.  Sinuses/Orbits: No acute finding.  Other: None.  IMPRESSION: No acute intracranial pathology.  Electronically Signed   By: Keane Police D.O.   On: 09/01/2022 11:45 Note: Reviewed        Physical Exam  General appearance: Well nourished, well developed, and well hydrated. In no apparent acute distress Mental status: Alert, oriented x 3 (person, place, & time)       Respiratory: No evidence of acute respiratory distress Eyes: PERLA Vitals: There were no vitals taken for this visit. BMI: Estimated body mass index is 18.35 kg/m as calculated from the following:   Height as of 09/03/22: _0  (1.778 m).   Weight as of 09/03/22: 127 lb 13.9 oz (58 kg). Ideal: Ideal body weight: 73 kg (160 lb 15 oz)  Assessment   Diagnosis Status  No diagnosis found. Controlled Controlled Controlled   Updated Problems: No problems updated.  Plan of Care  Problem-specific:  No problem-specific Assessment & Plan notes found for this encounter.  Mr. Gerrod Maule Harvey is not on any long-term medications.  Pharmacotherapy (Medications Ordered): No orders of the defined types were placed in this encounter.  Orders:  No orders of the defined types were placed in this encounter.  Follow-up plan:   No follow-ups on file.     Interventional Therapies  Risk  Complexity Considerations:   Estimated body mass index is 18.65 kg/m as calculated from the following:   Height as of this encounter: _1  (1.778 m).   Weight as of this encounter: 130 lb (59 kg). WNL   Planned  Pending:   Diagnostic right cervical facet C3-C7 MBB #2 & (B) C7 & T1 MBB #2    Under consideration:   Diagnostic right cervical facet  C3-C7 MBB #2 & (B) C7 & T1 MBB #2    Completed:   Diagnostic right cervical facet C3-C7 MBB x1 (04/25/2022)  Diagnostic bilateral cervical facet C7 & T1 MBB x1 (04/25/2022)  Diagnostic right Cervical ESI x1 (03/23/2022) (DOS: 10-10/10) (0/0/0/0) (patient  indicated having gone to the ED x4 since procedure.)   Completed by other providers:   Therapeutic right C3 and C4 medial branch RFA x1 (07/30/2020) by Sharlet Salina, DO [facility])  Diagnostic right C3 and C4 MBB x2 (06/30/2020, 07/15/2020) by Sharlet Salina, DO [facility])  Diagnostic right C3-4 zygapophyseal joint (facet joint) inj. x1 (03/05/2020) by Sharlet Salina, DO   Therapeutic  Palliative (PRN) options:   None established       Recent Visits Date Type Provider Dept  06/13/22 Office Visit Milinda Pointer, MD [facility]  Showing recent visits within past 90 days and meeting all other requirements Future Appointments Date Type Provider Dept  09/04/22 Appointment Milinda Pointer, MD [facility]  Showing future appointments within next 90 days and meeting all other requirements  I discussed the assessment and treatment plan with the patient. The patient was provided an opportunity to ask questions and all were answered. The patient agreed with the plan and demonstrated an understanding of the instructions.  Patient advised to call back or seek an in-person evaluation if the symptoms or condition worsens.  Duration of encounter: *** minutes.  Total time on encounter, as per AMA guidelines included both the face-to-face and non-face-to-face time personally spent by the physician and/or other qualified health care professional(s) on the day of the encounter (includes time in activities that require the physician or other qualified health care professional and does not include time in activities normally performed by clinical staff). Physician's time may include the following activities when performed: preparing to see the patient (eg, review of tests, pre-charting review of records) obtaining and/or reviewing separately obtained history performing a medically appropriate examination and/or evaluation counseling and educating the  patient/family/caregiver ordering medications, tests, or procedures referring and communicating with other health care professionals (when not separately reported) documenting clinical information in the electronic or other health record independently interpreting results (not separately reported) and communicating results to the patient/ family/caregiver care coordination (not separately reported)  Note by: Johnathan Cola, MD Date: 09/04/2022; Time: 2:18 PM

## 2022-09-03 NOTE — ED Provider Notes (Signed)
[facility] Provider Note    Event Date/Time   First MD Initiated Contact with Patient 09/03/22 1254     (approximate)   History   Headache   HPI  Johnathan Harvey is a 66 y.o. male with a past medical history of cervical spine facet hypertrophy, anterolisthesis, degenerative disc disease, foraminal stenosis, cervicogenic headache, multiple ER visits for headache who presents today for evaluation of headache.  Patient reports that his headache feels exactly same as it normally does, starts from his neck and goes into his forehead.  He reports that it started this morning and gradually got worse throughout the day.  He took Advil without improvement of his symptoms.  He denies any injuries.  He denies any changes in his vision.  He reports that he is nauseated which is typical of his migraine headaches, but has not had any vomiting.  He denies paresthesias or weakness.  He reports that he has an appointment tomorrow with his pain management specialist.  No trauma to his head.  No change in his headache today from his normal headaches.  He is requesting a "migraine cocktail."  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) (Right: C4-5) (Left: C5-6) 02/15/2022   Cervicogenic headache (2ry area of Pain) (Right) 02/15/2022   Cervical facet syndrome 02/15/2022   Spondylosis without myelopathy or radiculopathy, cervical region 02/15/2022   History of abnormal drug screen 02/15/2022   Occipital headache (Right) 02/15/2022   Neck pain of over 3 months duration 02/15/2022   Cervical neck pain with evidence of disc disease 02/15/2022    Chronic neck and back pain 02/15/2022   Musculoskeletal disorder involving upper trapezius muscle (Right) 02/15/2022   Gout 02/14/2022   History of marijuana use 02/14/2022   Abnormal MRI, cervical spine (09/28/2021) 02/14/2022   Chronic pain syndrome 02/14/2022   Pharmacologic therapy 02/14/2022   Disorder of skeletal system 02/14/2022   Problems influencing health status 02/14/2022   Basal cell carcinoma 02/07/2022   Changing skin lesion 11/25/2021   Cervical disc disease 11/24/2021   History of hepatitis C 11/24/2021   Medicare annual wellness visit, initial 11/24/2021   Emesis 07/02/2021   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   Cervical myofascial pain syndrome 03/30/2021   Chronic tension-type headache, intractable 03/30/2021   Septic prepatellar bursitis of knee (Left) 09/07/2020   Headache disorder 09/21/2019   Cervicalgia (1ry area of Pain) (Right) 09/21/2019   History of narcotic abuse ([place]) 08/01/2016    Class: History of   Chest pain 07/29/2016   Cellulitis and abscess of leg 07/28/2015   GERD (gastroesophageal reflux disease) 07/28/2015   Arthritis 07/28/2015   Gastroesophageal reflux disease without esophagitis 07/28/2015   History of opiate overdose ([place]) 05/02/2015    Class: History of   Intestinal metaplasia of gastric mucosa 12/25/2014   Hypertension 07/30/2013   Dysphagia 07/18/2013   Esophageal stricture 07/18/2013          Physical Exam   Triage Vital Signs: ED Triage Vitals [09/03/22 1239]  Enc Vitals Group     BP (!) 161/110     Pulse Rate 79     Resp 18     Temp 97.6 F (36.4 C)  Temp src      SpO2 100 %     Weight 127 lb 13.9 oz (58 kg)     Height '5\' 10"'$  (1.778 m)     Head Circumference      Peak Flow      Pain Score 9     Pain Loc      Pain Edu?      Excl. in [place]?     Most recent vital signs: Vitals:   09/03/22 1239 09/03/22 1455  BP: (!) 161/110 (!) 160/99  Pulse: 79 80  Resp: 18 18  Temp: 97.6 F (36.4 C) 97.7 F  (36.5 C)  SpO2: 100% 98%    Physical Exam Vitals and nursing note reviewed.  Constitutional:      General: Awake and alert. No acute distress.    Appearance: Normal appearance. The patient is normal weight.  HENT:     Head: Normocephalic and atraumatic.     Mouth: Mucous membranes are moist.  Eyes:     General: PERRL. Normal EOMs        Right eye: No discharge.        Left eye: No discharge.     Conjunctiva/sclera: Conjunctivae normal.  Cardiovascular:     Rate and Rhythm: Normal rate and regular rhythm.     Pulses: Normal pulses.     Heart sounds: Normal heart sounds Pulmonary:     Effort: Pulmonary effort is normal. No respiratory distress.     Breath sounds: Normal breath sounds.  Abdominal:     Abdomen is soft. There is no abdominal tenderness. No rebound or guarding. No distention. Musculoskeletal:        General: No swelling. Normal range of motion.     Cervical back: In cervical collar Skin:    General: Skin is warm and dry.     Capillary Refill: Capillary refill takes less than 2 seconds.  Neurological:     Mental Status: The patient is awake and alert.  Neurological: GCS 15 alert and oriented x3 Normal speech, no expressive or receptive aphasia or dysarthria Cranial nerves II through XII intact Normal visual fields 5 out of 5 strength in all 4 extremities with intact sensation throughout No extremity drift Normal finger-to-nose testing, no limb or truncal ataxia     ED Results / Procedures / Treatments   Labs (all labs ordered are listed, but only abnormal results are displayed) Labs Reviewed - No data to display   EKG     RADIOLOGY     PROCEDURES:  Critical Care performed:   Procedures   MEDICATIONS ORDERED IN ED: Medications  ondansetron (ZOFRAN) injection 4 mg (4 mg Intravenous Given 09/03/22 1247)  ketorolac (TORADOL) 15 MG/ML injection 15 mg (15 mg Intravenous Given 09/03/22 1408)  metoCLOPramide (REGLAN) injection 10 mg (10 mg  Intravenous Given 09/03/22 1408)  sodium chloride 0.9 % bolus 1,000 mL (0 mLs Intravenous Stopped 09/03/22 1528)     IMPRESSION / MDM / ASSESSMENT AND PLAN / ED COURSE  I reviewed the triage vital signs and the nursing notes.   Differential diagnosis includes, but is not limited to, migraine headache, acute on chronic headache, cervicogenic headache, less likely intracranial hemorrhage given lack of trauma.  I reviewed the patient's chart.  He has had numerous visits to the emergency department for similar headaches, most recently yesterday at which time he had a negative CT scan of his head.  Patient's headache was gradual in onset, without history or physical exam  findings to suggest encephalopathy; no altered mental status, fever or meningismus, vision changes, vomiting or focal neurological deficit and improved with treatment in the emergency department. Therefore, I have low suspicion for concerning process that would require urgent or emergent imaging or diagnostic/therapeutic procedural intervention such as lumbar puncture.  He had a negative CT scan yesterday for similar complaint, and has been seen many times for the same complaint.  Reports that nothing is different today, just requesting a migraine cocktail which is what normally makes him feel better.  Doubt meningitis as there is no fever, photophobia, neck symptoms, altered mental status. Additionally the patient is not known to be immunocompromised. No history of trauma, doubt subdural or epidural hematoma. No dizziness or other neurologic symptoms so cerebellar infarction or other hemorrhagic stroke are unlikely. Intracranial mass unlikely given that the headache is not getting progressively worse, is not worse in the morning, there are no other neurologic symptoms, and the neurologic exam is grossly normal. Unlikely to be giant cell arteritis as there is no tenderness over temporal artery or vision changes. Doubt CO toxicity as no known  exposure and no other family members have a headache. No neck pain and was not sudden onset or associated with movement of the neck and no dizziness,  doubt carotid artery dissection. No occipital tenderness so occipital neuralgia seems less likely. Return precautions discussed, patient to follow-up closely with outpatient provider.  Patient has an appointment tomorrow with his outpatient provider, I recommended that he keep this appointment.  In the meantime we discussed return precautions.  Patient understands and agrees with plan.  Discharged in stable condition.   Patient's presentation is most consistent with exacerbation of chronic illness.   Clinical Course as of 09/03/22 1537  Sun Sep 03, 2022  1439 Patient reports headache has resolved and he feels ready for discharge home [JP]    Clinical Course User Index [JP] Masiya Claassen, Clarnce Flock, PA-C     FINAL CLINICAL IMPRESSION(S) / ED DIAGNOSES   Final diagnoses:  Acute nonintractable headache, unspecified headache type     Rx / DC Orders   ED Discharge Orders     None        Note:  This document was prepared using Dragon voice recognition software and may include unintentional dictation errors.   Emeline Gins 09/03/22 1537    Rada Hay, MD 09/04/22 2328

## 2022-09-04 ENCOUNTER — Ambulatory Visit: Payer: No Typology Code available for payment source | Attending: Pain Medicine | Admitting: Pain Medicine

## 2022-09-04 ENCOUNTER — Encounter: Payer: Self-pay | Admitting: Pain Medicine

## 2022-09-04 VITALS — BP 171/107 | HR 74 | Temp 97.9°F | Resp 18 | Ht 70.0 in | Wt 130.0 lb

## 2022-09-04 DIAGNOSIS — M5030 Other cervical disc degeneration, unspecified cervical region: Secondary | ICD-10-CM | POA: Insufficient documentation

## 2022-09-04 DIAGNOSIS — G8918 Other acute postprocedural pain: Secondary | ICD-10-CM | POA: Insufficient documentation

## 2022-09-04 DIAGNOSIS — M47812 Spondylosis without myelopathy or radiculopathy, cervical region: Secondary | ICD-10-CM | POA: Diagnosis present

## 2022-09-04 DIAGNOSIS — M542 Cervicalgia: Secondary | ICD-10-CM | POA: Insufficient documentation

## 2022-09-04 DIAGNOSIS — G4486 Cervicogenic headache: Secondary | ICD-10-CM | POA: Diagnosis not present

## 2022-09-04 DIAGNOSIS — Z9889 Other specified postprocedural states: Secondary | ICD-10-CM | POA: Insufficient documentation

## 2022-09-04 DIAGNOSIS — M4312 Spondylolisthesis, cervical region: Secondary | ICD-10-CM | POA: Insufficient documentation

## 2022-09-04 MED ORDER — GABAPENTIN 300 MG PO CAPS: ORAL_CAPSULE | ORAL | 0 refills | Status: DC

## 2022-09-04 MED ORDER — CYCLOBENZAPRINE HCL 10 MG PO TABS: 10.0000 mg | ORAL_TABLET | Freq: Every day | ORAL | 0 refills | Status: DC

## 2022-09-04 MED ORDER — PREDNISONE 20 MG PO TABS: ORAL_TABLET | ORAL | 0 refills | Status: AC

## 2022-09-04 NOTE — Progress Notes (Signed)
Safety precautions to be maintained throughout the outpatient stay will include: orient to surroundings, keep bed in low position, maintain call bell within reach at all times, provide assistance with transfer out of bed and ambulation.  

## 2022-10-01 ENCOUNTER — Other Ambulatory Visit: Payer: Self-pay | Admitting: Pain Medicine

## 2022-10-01 DIAGNOSIS — G4486 Cervicogenic headache: Secondary | ICD-10-CM

## 2022-10-01 DIAGNOSIS — M542 Cervicalgia: Secondary | ICD-10-CM

## 2022-10-01 DIAGNOSIS — G8918 Other acute postprocedural pain: Secondary | ICD-10-CM

## 2022-10-01 NOTE — Progress Notes (Signed)
PROVIDER NOTE: Information contained herein reflects review and annotations entered in association with encounter. Interpretation of such information and data should be left to medically-trained personnel. Information provided to patient can be located elsewhere in the medical record under "Patient Instructions". Document created using STT-dictation technology, any transcriptional errors that may result from process are unintentional.    Patient: Johnathan Harvey  Service Category: E/M  Provider: Gaspar Cola, MD  DOB: 07/26/1956  DOS: 10/02/2022  Referring Provider: Rusty Aus, MD  MRN: [id number]  Specialty: Interventional Pain Management  PCP: Rusty Aus, MD  Type: Established Patient  Setting: Ambulatory outpatient    Location: Office  Delivery: Face-to-face     HPI  Johnathan Harvey, a 66 y.o. year old male, is here today because of his No primary diagnosis found.. Johnathan Harvey's primary complain today is No chief complaint on file. Last encounter: My last encounter with him was on 09/04/2022. Pertinent problems: 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/T1); Cervical facet arthropathy (Multilevel) (Bilateral); DDD (degenerative disc disease), cervical; Cervical foraminal stenosis (Bilateral: C6-7) (Right: C4-5) (Left: C5-6); Cervicogenic headache (2ry area of Pain) (Right); Cervical facet syndrome; Spondylosis without myelopathy or radiculopathy, cervical region; Occipital headache (Right); Neck pain of over 3 months duration; Cervical neck pain with evidence of disc disease; Chronic neck and back pain; Musculoskeletal disorder involving upper trapezius muscle  (Right); Cervical facet hypertrophy (Multilevel) (Bilateral); Postoperative pain after spinal surgery; and Neck pain with history of cervical spinal surgery on their pertinent problem list. Pain Assessment: Severity of   is reported as a  /10. Location:    / . Onset:  . Quality:  . Timing:  . Modifying factor(s):  Marland Kitchen Vitals:  vitals were not taken for this visit.  BMI: Estimated body mass index is 18.65 kg/m as calculated from the following:   Height as of 09/04/22: _0  (1.778 m).   Weight as of 09/04/22: 130 lb (59 kg).  Reason for encounter:  *** . ***  Pharmacotherapy Assessment  Analgesic: None MME/day: 0 mg/day   Monitoring: [place] PMP: PDMP reviewed during this encounter.       Pharmacotherapy: No side-effects or adverse reactions reported. Compliance: No problems identified. Effectiveness: Clinically acceptable.  No notes on file  No results found for: "CBDTHCR" No results found for: "D8THCCBX" No results found for: "D9THCCBX"  UDS:  No results found for: "SUMMARY"    ROS  Constitutional: Denies any fever or chills Gastrointestinal: No reported hemesis, hematochezia, vomiting, or acute GI distress Musculoskeletal: Denies any acute onset joint swelling, redness, loss of ROM, or weakness Neurological: No reported episodes of acute onset apraxia, aphasia, dysarthria, agnosia, amnesia, paralysis, loss of coordination, or loss of consciousness  Medication Review  PRESCRIPTION MEDICATION, amLODipine, cyclobenzaprine, gabapentin, ondansetron, and oxyCODONE-acetaminophen  History Review  Allergy: Johnathan Harvey is allergic to protonix [pantoprazole]. Drug: Johnathan Harvey  reports that he does not currently use drugs after having used the following drugs: Marijuana. Alcohol:  reports no history of alcohol use. Tobacco:  reports that he has quit smoking. His smoking use included cigarettes. He has never used smokeless tobacco. Social: Johnathan Harvey  reports that he has quit  smoking. His smoking use included cigarettes. He has never used smokeless tobacco. He  reports that he does not currently use drugs after having used the following drugs: Marijuana. He reports that he does not drink alcohol. Medical:  has a past medical history of Arthritis, Esophageal stricture, GERD (gastroesophageal reflux disease), History of MRSA infection (2016), Hypertension, Migraine, and Skin cancer, basal cell. Surgical: Johnathan Harvey  has a past surgical history that includes Ankle arthroplasty; Esophagogastroduodenoscopy (N/A, 11/04/2018); and Inguinal hernia repair (Left, 09/26/2019). Family: family history includes Cancer in his father and mother.  Laboratory Chemistry Profile   Renal Lab Results  Component Value Date   BUN 21 03/17/2022   CREATININE 0.80 03/17/2022   GFRAA >60 10/10/2019   GFRNONAA >60 03/17/2022    Hepatic Lab Results  Component Value Date   AST 20 03/17/2022   ALT 13 03/17/2022   ALBUMIN 4.7 03/17/2022   ALKPHOS 62 03/17/2022   LIPASE 39 03/17/2022    Electrolytes Lab Results  Component Value Date   NA 137 03/17/2022   K 4.3 03/17/2022   CL 105 03/17/2022   CALCIUM 9.3 03/17/2022   MG 2.1 03/11/2021    Bone No results found for: "VD25OH", "VD125OH2TOT", "LT5320EB3", "ID5686HU8", "25OHVITD1", "25OHVITD2", "25OHVITD3", "TESTOFREE", "TESTOSTERONE"  Inflammation (CRP: Acute Phase) (ESR: Chronic Phase) Lab Results  Component Value Date   LATICACIDVEN 0.8 10/10/2019         Note: Above Lab results reviewed.  Recent Imaging Review  CT Head Wo Contrast CLINICAL DATA:  Headache.  EXAM: CT HEAD WITHOUT CONTRAST  TECHNIQUE: Contiguous axial images were obtained from the base of the skull through the vertex without intravenous contrast.  RADIATION DOSE REDUCTION: This exam was performed according to the departmental dose-optimization program which includes automated exposure control, adjustment of the mA and/or kV according to patient size  and/or use of iterative reconstruction technique.  COMPARISON:  CT examination dated March 17, 2022  FINDINGS: Brain: No evidence of acute infarction, hemorrhage, hydrocephalus, extra-axial collection or mass lesion/mass effect.  Vascular: No hyperdense vessel or unexpected calcification.  Skull: Normal. Negative for fracture or focal lesion.  Sinuses/Orbits: No acute finding.  Other: None.  IMPRESSION: No acute intracranial pathology.  Electronically Signed   By: Keane Police D.O.   On: 09/01/2022 11:45 Note: Reviewed        Physical Exam  General appearance: Well nourished, well developed, and well hydrated. In no apparent acute distress Mental status: Alert, oriented x 3 (person, place, & time)       Respiratory: No evidence of acute respiratory distress Eyes: PERLA Vitals: There were no vitals taken for this visit. BMI: Estimated body mass index is 18.65 kg/m as calculated from the following:   Height as of 09/04/22: _0  (1.778 m).   Weight as of 09/04/22: 130 lb (59 kg). Ideal: Patient weight not recorded  Assessment   Diagnosis Status  No diagnosis found. Controlled Controlled Controlled   Updated Problems: No problems updated.  Plan of Care  Problem-specific:  No problem-specific Assessment & Plan notes found for this encounter.  Mr. Donovon Micheletti Camerer has a current medication list which includes the following long-term medication(s): amlodipine, cyclobenzaprine, and gabapentin.  Pharmacotherapy (Medications Ordered): No orders of the defined types were placed in this encounter.  Orders:  No orders of the defined types were placed in this encounter.  Follow-up plan:   No follow-ups on file.     Interventional Therapies  Risk  Complexity Considerations:   Estimated body mass index is 18.65 kg/m as calculated from the following:   Height  as of this encounter: _0  (1.778 m).   Weight as of this encounter: 130 lb (59 kg). WNL   Planned   Pending:   20-day prednisone taper, Flexeril 10 mg at bedtime, gabapentin 600 mg at bedtime and 300 mg at noon x 20-30 days.   Under consideration:   Diagnostic right cervical facet C3-C7 MBB #3 & (B) C7 & T1 MBB #3  Diagnostic right cervical facet C3-C7 RFA #1 & (B) C7 & T1 RFA #1    Completed:   Diagnostic bilateral cervical facet C6-T1 MBB x1 (05/25/2022) (100/100/85/85)  Diagnostic right cervical facet C3-C5 MBB x1 (05/25/2022) (100/100/85/85)  Diagnostic right cervical facet C3-C7 MBB x1 (04/25/2022) (100/100/60/100) (100-Neck/60-H/A)  Diagnostic bilateral cervical facet C7 & T1 MBB x1 (04/25/2022) (100/100/60/100) (100-Neck/60-H/A)  Diagnostic right Cervical ESI x1 (03/23/2022) (DOS: 10-10/10) (0/0/0/0) (patient indicated having gone to the ED x4 since procedure.)   Completed by other providers:   Therapeutic right C3 and C4 medial branch RFA x1 (07/30/2020) by Sharlet Salina, DO [facility] PMR)  Diagnostic right C3 and C4 MBB x2 (06/30/2020, 07/15/2020) by Sharlet Salina, DO [facility] PMR)  Diagnostic right C3-4 zygapophyseal joint (facet joint) inj. x1 (03/05/2020) by Sharlet Salina, DO Therapeutic  ACDF C4/5, C5/6, C6/7 (07/04/2022) by Duffy Rhody, MD [facility] neurosurgeon)   Therapeutic  Palliative (PRN) options:   None established     Recent Visits Date Type Provider Dept  09/04/22 Office Visit Milinda Pointer, MD [facility]  Showing recent visits within past 90 days and meeting all other requirements Future Appointments Date Type Provider Dept  10/02/22 Appointment Milinda Pointer, MD [facility]  Showing future appointments within next 90 days and meeting all other requirements  I discussed the assessment and treatment plan with the patient. The patient was provided an opportunity to ask questions and all were answered. The patient agreed with the plan and demonstrated an understanding of the instructions.  Patient advised to call back or seek an  in-person evaluation if the symptoms or condition worsens.  Duration of encounter: *** minutes.  Total time on encounter, as per AMA guidelines included both the face-to-face and non-face-to-face time personally spent by the physician and/or other qualified health care professional(s) on the day of the encounter (includes time in activities that require the physician or other qualified health care professional and does not include time in activities normally performed by clinical staff). Physician's time may include the following activities when performed: preparing to see the patient (eg, review of tests, pre-charting review of records) obtaining and/or reviewing separately obtained history performing a medically appropriate examination and/or evaluation counseling and educating the patient/family/caregiver ordering medications, tests, or procedures referring and communicating with other health care professionals (when not separately reported) documenting clinical information in the electronic or other health record independently interpreting results (not separately reported) and communicating results to the patient/ family/caregiver care coordination (not separately reported)  Note by: Gaspar Cola, MD Date: 10/02/2022; Time: 9:52 AM

## 2022-10-02 ENCOUNTER — Encounter: Payer: Self-pay | Admitting: Pain Medicine

## 2022-10-02 ENCOUNTER — Ambulatory Visit: Payer: No Typology Code available for payment source | Attending: Pain Medicine | Admitting: Pain Medicine

## 2022-10-02 VITALS — BP 144/95 | HR 88 | Temp 98.2°F | Ht 70.0 in | Wt 140.0 lb

## 2022-10-02 DIAGNOSIS — M4312 Spondylolisthesis, cervical region: Secondary | ICD-10-CM | POA: Diagnosis not present

## 2022-10-02 DIAGNOSIS — G4486 Cervicogenic headache: Secondary | ICD-10-CM | POA: Diagnosis not present

## 2022-10-02 DIAGNOSIS — G8918 Other acute postprocedural pain: Secondary | ICD-10-CM | POA: Diagnosis not present

## 2022-10-02 DIAGNOSIS — M542 Cervicalgia: Secondary | ICD-10-CM | POA: Insufficient documentation

## 2022-10-02 DIAGNOSIS — Z9889 Other specified postprocedural states: Secondary | ICD-10-CM | POA: Diagnosis not present

## 2022-10-02 DIAGNOSIS — M47812 Spondylosis without myelopathy or radiculopathy, cervical region: Secondary | ICD-10-CM | POA: Diagnosis not present

## 2022-10-02 DIAGNOSIS — M4802 Spinal stenosis, cervical region: Secondary | ICD-10-CM | POA: Insufficient documentation

## 2022-10-02 MED ORDER — CYCLOBENZAPRINE HCL 10 MG PO TABS: 10.0000 mg | ORAL_TABLET | Freq: Every day | ORAL | 2 refills | Status: AC

## 2022-10-02 MED ORDER — GABAPENTIN 300 MG PO CAPS: 900.0000 mg | ORAL_CAPSULE | Freq: Every day | ORAL | 2 refills | Status: AC

## 2022-10-02 NOTE — Patient Instructions (Signed)
____________________________________________________________________________________________  Patient Information update  To: All of our patients.  Re: Name change.  It has been made official that our current name, "[facility]"   will soon be changed to "[facility]".   The purpose of this change is to eliminate any confusion created by the concept of our practice being a "[facility]". In the past this has led to the misconception that we treat pain primarily by the use of prescription medications.  Nothing can be farther from the truth.   Understanding PAIN MANAGEMENT: To further understand what our practice does, you first have to understand that "Pain Management" is a subspecialty that requires additional training once a physician has completed their specialty training, which can be in either Anesthesia, Neurology, Psychiatry, or Physical Medicine and Rehabilitation (PMR). Each one of these contributes to the final approach taken by each physician to the management of their patient's pain. To be a "Pain Management Specialist" you must have first completed one of the specialty trainings below.  Anesthesiologists - trained in clinical pharmacology and interventional techniques such as nerve blockade and regional as well as central neuroanatomy. They are trained to block pain before, during, and after surgical interventions.  Neurologists - trained in the diagnosis and pharmacological treatment of complex neurological conditions, such as Multiple Sclerosis, Parkinson's, spinal cord injuries, and other systemic conditions that may be associated with symptoms that may include but are not limited to pain. They tend to rely primarily on the treatment of chronic pain using prescription medications.  Psychiatrist - trained in conditions affecting the psychosocial  wellbeing of patients including but not limited to depression, anxiety, schizophrenia, personality disorders, addiction, and other substance use disorders that may be associated with chronic pain. They tend to rely primarily on the treatment of chronic pain using prescription medications.   Physical Medicine and Rehabilitation (PMR) physicians, also known as physiatrists - trained to treat a wide variety of medical conditions affecting the brain, spinal cord, nerves, bones, joints, ligaments, muscles, and tendons. Their training is primarily aimed at treating patients that have suffered injuries that have caused severe physical impairment. Their training is primarily aimed at the physical therapy and rehabilitation of those patients. They may also work alongside orthopedic surgeons or neurosurgeons using their expertise in assisting surgical patients to recover after their surgeries.  INTERVENTIONAL PAIN MANAGEMENT is sub-subspecialty of Pain Management.  Our physicians are Board-certified in Anesthesia, Pain Management, and Interventional Pain Management.  This meaning that not only have they been trained and Board-certified in their specialty of Anesthesia, and subspecialty of Pain Management, but they have also received further training in the sub-subspecialty of Interventional Pain Management, in order to become Board-certified as INTERVENTIONAL PAIN MANAGEMENT SPECIALIST.    Mission: Our goal is to use our skills in  [place] as alternatives to the chronic use of prescription opioid medications for the treatment of pain. To make this more clear, we have changed our name to reflect what we do and offer. We will continue to offer medication management assessment and recommendations, but we will not be taking over any patient's medication management.  ____________________________________________________________________________________________      ______________________________________________________________________  Preparing for your procedure  During your procedure appointment there will be: No Prescription Refills. No disability issues to discussed. No medication changes or discussions.  Instructions: Food intake: Avoid eating anything solid for at least 8 hours prior to your procedure.  Clear liquid intake: You may take clear liquids such as water up to 2 hours prior to your procedure. (No carbonated drinks. No soda.) Transportation: Unless otherwise stated by your physician, bring a driver. Morning Medicines: Except for blood thinners, take all of your other morning medications with a sip of water. Make sure to take your heart and blood pressure medicines. If your blood pressure's lower number is above 100, the case will be rescheduled. Blood thinners: If you take a blood thinner, but were not instructed to stop it, call our office [phone] and ask to talk to a nurse. Not stopping a blood thinner prior to certain procedures could lead to serious complications. Diabetics on insulin: Notify the staff so that you can be scheduled 1st case in the morning. If your diabetes requires high dose insulin, take only  of your normal insulin dose the morning of the procedure and notify the staff that you have done so. Preventing infections: Shower with an antibacterial soap the morning of your procedure.  Build-up your immune system: Take 1000 mg of Vitamin C with every meal (3 times a day) the day prior to your procedure. Antibiotics: Inform the nursing staff if you are taking any antibiotics or if you have any conditions that may require antibiotics prior to procedures. (Example: recent joint implants)   Pregnancy: If you are pregnant make sure to notify the nursing staff. Not doing so may result in injury to the fetus, including death.  Sickness: If you have a cold, fever, or any active infections, call and cancel or reschedule your  procedure. Receiving steroids while having an infection may result in complications. Arrival: You must be in the facility at least 30 minutes prior to your scheduled procedure. Tardiness: Your scheduled time is also the cutoff time. If you do not arrive at least 15 minutes prior to your procedure, you will be rescheduled.  Children: Do not bring any children with you. Make arrangements to keep them home. Dress appropriately: There is always a possibility that your clothing may get soiled. Avoid long dresses. Valuables: Do not bring any jewelry or valuables.  Reasons to call and reschedule or cancel your procedure: (Following these recommendations will minimize the risk of a serious complication.) Surgeries: Avoid having procedures within 2 weeks of any surgery. (Avoid for 2 weeks before or after any surgery). Flu Shots: Avoid having procedures within 2 weeks of a flu shots or . (Avoid for 2 weeks before or after immunizations). Barium: Avoid having a procedure within 7-10 days after having had a radiological study involving the use of radiological contrast. (Myelograms, Barium swallow or enema study). Heart attacks: Avoid any elective procedures or surgeries for the initial 6 months after a "Myocardial Infarction" (Heart Attack). Blood thinners: It is imperative that you stop these medications before procedures. Let us know if you if you take any blood thinner.  Infection: Avoid procedures during or within two weeks of an infection (including chest colds or gastrointestinal problems). Symptoms associated with infections include: Localized redness, fever, chills, night sweats or profuse sweating, burning sensation when voiding, cough, congestion, stuffiness, runny nose, sore throat, diarrhea, nausea, vomiting, cold or Flu symptoms, recent or current infections. It is specially important if the infection is over the area that we intend to treat. Heart and lung problems: Symptoms that may suggest an  active cardiopulmonary problem include: cough, chest pain, breathing difficulties or shortness of breath, dizziness, ankle swelling, uncontrolled high or unusually low blood pressure, and/or palpitations. If  you are experiencing any of these symptoms, cancel your procedure and contact your primary care physician for an evaluation.  Remember:  Regular Business hours are:  Monday to Thursday 8:00 AM to 4:00 PM  Provider's Schedule: Milinda Pointer, MD:  Procedure days: Tuesday and Thursday 7:30 AM to 4:00 PM  Gillis Santa, MD:  Procedure days: Monday and Wednesday 7:30 AM to 4:00 PM  ______________________________________________________________________    ____________________________________________________________________________________________  General Risks and Possible Complications  Patient Responsibilities: It is important that you read this as it is part of your informed consent. It is our duty to inform you of the risks and possible complications associated with treatments offered to you. It is your responsibility as a patient to read this and to ask questions about anything that is not clear or that you believe was not covered in this document.  Patient's Rights: You have the right to refuse treatment. You also have the right to change your mind, even after initially having agreed to have the treatment done. However, under this last option, if you wait until the last second to change your mind, you may be charged for the materials used up to that point.  Introduction: Medicine is not an Chief Strategy Officer. Everything in Medicine, including the lack of treatment(s), carries the potential for danger, harm, or loss (which is by definition: Risk). In Medicine, a complication is a secondary problem, condition, or disease that can aggravate an already existing one. All treatments carry the risk of possible complications. The fact that a side effects or complications occurs, does not imply  that the treatment was conducted incorrectly. It must be clearly understood that these can happen even when everything is done following the highest safety standards.  No treatment: You can choose not to proceed with the proposed treatment alternative. The "PRO(s)" would include: avoiding the risk of complications associated with the therapy. The "CON(s)" would include: not getting any of the treatment benefits. These benefits fall under one of three categories: diagnostic; therapeutic; and/or palliative. Diagnostic benefits include: getting information which can ultimately lead to improvement of the disease or symptom(s). Therapeutic benefits are those associated with the successful treatment of the disease. Finally, palliative benefits are those related to the decrease of the primary symptoms, without necessarily curing the condition (example: decreasing the pain from a flare-up of a chronic condition, such as incurable terminal cancer).  General Risks and Complications: These are associated to most interventional treatments. They can occur alone, or in combination. They fall under one of the following six (6) categories: no benefit or worsening of symptoms; bleeding; infection; nerve damage; allergic reactions; and/or death. No benefits or worsening of symptoms: In Medicine there are no guarantees, only probabilities. No healthcare provider can ever guarantee that a medical treatment will work, they can only state the probability that it may. Furthermore, there is always the possibility that the condition may worsen, either directly, or indirectly, as a consequence of the treatment. Bleeding: This is more common if the patient is taking a blood thinner, either prescription or over the counter (example: Goody Powders, Fish oil, Aspirin, Garlic, etc.), or if suffering a condition associated with impaired coagulation (example: Hemophilia, cirrhosis of the liver, low platelet counts, etc.). However, even if you  do not have one on these, it can still happen. If you have any of these conditions, or take one of these drugs, make sure to notify your treating physician. Infection: This is more common in patients with a compromised immune system,  either due to disease (example: diabetes, cancer, human immunodeficiency virus [HIV], etc.), or due to medications or treatments (example: therapies used to treat cancer and rheumatological diseases). However, even if you do not have one on these, it can still happen. If you have any of these conditions, or take one of these drugs, make sure to notify your treating physician. Nerve Damage: This is more common when the treatment is an invasive one, but it can also happen with the use of medications, such as those used in the treatment of cancer. The damage can occur to small secondary nerves, or to large primary ones, such as those in the spinal cord and brain. This damage may be temporary or permanent and it may lead to impairments that can range from temporary numbness to permanent paralysis and/or brain death. Allergic Reactions: Any time a substance or material comes in contact with our body, there is the possibility of an allergic reaction. These can range from a mild skin rash (contact dermatitis) to a severe systemic reaction (anaphylactic reaction), which can result in death. Death: In general, any medical intervention can result in death, most of the time due to an unforeseen complication. ____________________________________________________________________________________________

## 2022-10-02 NOTE — Progress Notes (Signed)
Safety precautions to be maintained throughout the outpatient stay will include: orient to surroundings, keep bed in low position, maintain call bell within reach at all times, provide assistance with transfer out of bed and ambulation.  

## 2022-10-04 DIAGNOSIS — D2262 Melanocytic nevi of left upper limb, including shoulder: Secondary | ICD-10-CM | POA: Diagnosis not present

## 2022-10-04 DIAGNOSIS — C44519 Basal cell carcinoma of skin of other part of trunk: Secondary | ICD-10-CM | POA: Diagnosis not present

## 2022-10-04 DIAGNOSIS — L570 Actinic keratosis: Secondary | ICD-10-CM | POA: Diagnosis not present

## 2022-10-04 DIAGNOSIS — L821 Other seborrheic keratosis: Secondary | ICD-10-CM | POA: Diagnosis not present

## 2022-10-04 DIAGNOSIS — Z85828 Personal history of other malignant neoplasm of skin: Secondary | ICD-10-CM | POA: Diagnosis not present

## 2022-10-04 DIAGNOSIS — D2261 Melanocytic nevi of right upper limb, including shoulder: Secondary | ICD-10-CM | POA: Diagnosis not present

## 2022-10-04 DIAGNOSIS — X32XXXA Exposure to sunlight, initial encounter: Secondary | ICD-10-CM | POA: Diagnosis not present

## 2022-10-04 DIAGNOSIS — D485 Neoplasm of uncertain behavior of skin: Secondary | ICD-10-CM | POA: Diagnosis not present

## 2022-10-04 DIAGNOSIS — C44629 Squamous cell carcinoma of skin of left upper limb, including shoulder: Secondary | ICD-10-CM | POA: Diagnosis not present

## 2022-10-06 DIAGNOSIS — M4802 Spinal stenosis, cervical region: Secondary | ICD-10-CM | POA: Diagnosis not present

## 2022-10-19 ENCOUNTER — Encounter: Payer: Self-pay | Admitting: Pain Medicine

## 2022-10-19 ENCOUNTER — Ambulatory Visit: Payer: No Typology Code available for payment source | Attending: Pain Medicine | Admitting: Pain Medicine

## 2022-10-19 ENCOUNTER — Ambulatory Visit
Admission: RE | Admit: 2022-10-19 | Discharge: 2022-10-19 | Disposition: A | Discharge status: Home / Self Care | Payer: No Typology Code available for payment source | Source: Ambulatory Visit | Attending: Pain Medicine | Admitting: Pain Medicine

## 2022-10-19 VITALS — BP 136/90 | HR 92 | Temp 98.0°F | Resp 16 | Ht 70.0 in | Wt 140.0 lb

## 2022-10-19 DIAGNOSIS — M47812 Spondylosis without myelopathy or radiculopathy, cervical region: Secondary | ICD-10-CM | POA: Insufficient documentation

## 2022-10-19 DIAGNOSIS — Z9889 Other specified postprocedural states: Secondary | ICD-10-CM | POA: Diagnosis not present

## 2022-10-19 DIAGNOSIS — G8929 Other chronic pain: Secondary | ICD-10-CM | POA: Insufficient documentation

## 2022-10-19 DIAGNOSIS — G4486 Cervicogenic headache: Secondary | ICD-10-CM | POA: Insufficient documentation

## 2022-10-19 DIAGNOSIS — M5030 Other cervical disc degeneration, unspecified cervical region: Secondary | ICD-10-CM | POA: Insufficient documentation

## 2022-10-19 DIAGNOSIS — M542 Cervicalgia: Secondary | ICD-10-CM | POA: Insufficient documentation

## 2022-10-19 DIAGNOSIS — M549 Dorsalgia, unspecified: Secondary | ICD-10-CM | POA: Diagnosis not present

## 2022-10-19 DIAGNOSIS — M4312 Spondylolisthesis, cervical region: Secondary | ICD-10-CM | POA: Insufficient documentation

## 2022-10-19 MED ORDER — MIDAZOLAM HCL 5 MG/5ML IJ SOLN
INTRAMUSCULAR | Status: AC
  Filled 2022-10-19: qty 5

## 2022-10-19 MED ORDER — ROPIVACAINE HCL 2 MG/ML IJ SOLN
18.0000 mL | Freq: Once | INTRAMUSCULAR | Status: AC
  Administered 2022-10-19: 18 mL via PERINEURAL

## 2022-10-19 MED ORDER — MIDAZOLAM HCL 5 MG/5ML IJ SOLN
0.5000 mg | Freq: Once | INTRAMUSCULAR | Status: AC
  Administered 2022-10-19: 3 mg via INTRAVENOUS

## 2022-10-19 MED ORDER — DEXAMETHASONE SODIUM PHOSPHATE 10 MG/ML IJ SOLN
20.0000 mg | Freq: Once | INTRAMUSCULAR | Status: AC
  Administered 2022-10-19: 20 mg

## 2022-10-19 MED ORDER — LIDOCAINE HCL 2 % IJ SOLN
20.0000 mL | Freq: Once | INTRAMUSCULAR | Status: AC
  Administered 2022-10-19: 400 mg

## 2022-10-19 MED ORDER — LIDOCAINE HCL 2 % IJ SOLN
INTRAMUSCULAR | Status: AC
  Filled 2022-10-19: qty 20

## 2022-10-19 MED ORDER — DEXAMETHASONE SODIUM PHOSPHATE 10 MG/ML IJ SOLN
INTRAMUSCULAR | Status: AC
  Filled 2022-10-19: qty 1

## 2022-10-19 MED ORDER — LACTATED RINGERS IV SOLN: Freq: Once | INTRAVENOUS | Status: AC

## 2022-10-19 MED ORDER — FENTANYL CITRATE (PF) 100 MCG/2ML IJ SOLN
25.0000 ug | INTRAMUSCULAR | Status: DC | PRN
  Administered 2022-10-19: 50 ug via INTRAVENOUS

## 2022-10-19 MED ORDER — FENTANYL CITRATE (PF) 100 MCG/2ML IJ SOLN
INTRAMUSCULAR | Status: AC
  Filled 2022-10-19: qty 2

## 2022-10-19 MED ORDER — PENTAFLUOROPROP-TETRAFLUOROETH EX AERO: INHALATION_SPRAY | Freq: Once | CUTANEOUS | Status: DC

## 2022-10-19 MED ORDER — ROPIVACAINE HCL 2 MG/ML IJ SOLN
INTRAMUSCULAR | Status: AC
  Filled 2022-10-19: qty 20

## 2022-10-19 NOTE — Progress Notes (Signed)
PROVIDER NOTE: Interpretation of information contained herein should be left to medically-trained personnel. Specific patient instructions are provided elsewhere under "Patient Instructions" section of medical record. This document was created in part using STT-dictation technology, any transcriptional errors that may result from this process are unintentional.  Patient: Johnathan Harvey Type: Established DOB: January 21, 1956 MRN: [id number] PCP: Johnathan Aus, MD  Service: Procedure DOS: 10/19/2022 Setting: Ambulatory Location: Ambulatory outpatient facility Delivery: Face-to-face Provider: Gaspar Cola, MD Specialty: Interventional Pain Management Specialty designation: 09 Location: Outpatient facility Ref. Prov.: Johnathan Pointer, MD     Procedure:           Type: Cervical Facet Medial Branch Block(s) #4  Laterality: Left  Level: C6, C7, and C8 Medial Branch Level(s). Injecting these levels blocks the C6-7 and C7-T1 cervical facet joints.  Laterality: Right  Level: C3, C4, C5, C6, C7, and C8 Medial Branch Level(s) blocking the C3-4, C4-5, C5-6, C6-7, and C7-T1 facet joints.  Imaging: Fluoroscopic guidance Anesthesia: Local anesthesia (1-2% Lidocaine) Anxiolysis: IV Versed         Sedation: Moderate Sedation                       DOS: 10/19/2022  Performed by: Johnathan Cola, MD  Purpose: Diagnostic/Therapeutic Indications: Cervicalgia (cervical spine axial pain) severe enough to impact quality of life or function. 1. Cervical facet syndrome   2. 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 Cervical facet hypertrophy (Multilevel) (Bilateral)   4. Spondylosis without myelopathy or radiculopathy, cervical region   5. Cervical facet arthropathy (Multilevel) (Bilateral)   6. Cervicalgia (1ry area of Pain)   7. Cervicogenic headache (2ry area of Pain) (Right)   8. Chronic neck and back pain   9. DDD (degenerative disc disease), cervical    NAS-11 Pain  score:   Pre-procedure: 2 /10   Post-procedure: 2 /10     Position / Prep / Materials:  Position: Prone. Head in cradle. C-spine slightly flexed. Prep solution: DuraPrep (Iodine Povacrylex [0.7% available iodine] and Isopropyl Alcohol, 74% w/w) Prep Area: Posterior Cervico-thoracic Region. From occipital ridge to tip of scapula, and from shoulder to shoulder. Entire posterior and lateral neck surface. Materials:  Tray: Block Needle(s):  Type: Spinal  Gauge (G): 22"  Length: 3.5-in  Qty: 5  Pre-op H&P Assessment:  Johnathan Harvey is a 67 y.o. (year old), male patient, seen today for interventional treatment. He  has a past surgical history that includes Ankle arthroplasty; Esophagogastroduodenoscopy (N/A, 11/04/2018); and Inguinal hernia repair (Left, 09/26/2019). Johnathan Harvey has a current medication list which includes the following prescription(s): amlodipine, cyclobenzaprine, gabapentin, ondansetron, PRESCRIPTION MEDICATION, and percocet, and the following Facility-Administered Medications: dexamethasone, fentanyl, lactated ringers, lidocaine, midazolam, pentafluoroprop-tetrafluoroeth, and ropivacaine (pf) 2 mg/ml (0.2%). His primarily concern today is the Neck Pain (Bilateral )  Initial Vital Signs:  Pulse/HCG Rate: 92  Temp: 98.1 F (36.7 C) Resp: 16 BP: (!) 147/85 (no BP meds this a.m.) SpO2: 98 %  BMI: Estimated body mass index is 20.09 kg/m as calculated from the following:   Height as of this encounter: '5\' 10"'$  (1.778 m).   Weight as of this encounter: 140 lb (63.5 kg).  Risk Assessment: Allergies: Reviewed. He is allergic to protonix [pantoprazole].  Allergy Precautions: None required Coagulopathies: Reviewed. None identified.  Blood-thinner therapy: None at this time Active Infection(s): Reviewed. None identified. Johnathan Harvey is afebrile  Site Confirmation: Johnathan Harvey was asked to confirm the procedure  and laterality before marking the site Procedure checklist:  Completed Consent: Before the procedure and under the influence of no sedative(s), amnesic(s), or anxiolytics, the patient was informed of the treatment options, risks and possible complications. To fulfill our ethical and legal obligations, as recommended by the American Medical Association's Code of Ethics, I have informed the patient of my clinical impression; the nature and purpose of the treatment or procedure; the risks, benefits, and possible complications of the intervention; the alternatives, including doing nothing; the risk(s) and benefit(s) of the alternative treatment(s) or procedure(s); and the risk(s) and benefit(s) of doing nothing. The patient was provided information about the general risks and possible complications associated with the procedure. These may include, but are not limited to: failure to achieve desired goals, infection, bleeding, organ or nerve damage, allergic reactions, paralysis, and death. In addition, the patient was informed of those risks and complications associated to Spine-related procedures, such as failure to decrease pain; infection (i.e.: Meningitis, epidural or intraspinal abscess); bleeding (i.e.: epidural hematoma, subarachnoid hemorrhage, or any other type of intraspinal or peri-dural bleeding); organ or nerve damage (i.e.: Any type of peripheral nerve, nerve root, or spinal cord injury) with subsequent damage to sensory, motor, and/or autonomic systems, resulting in permanent pain, numbness, and/or weakness of one or several areas of the body; allergic reactions; (i.e.: anaphylactic reaction); and/or death. Furthermore, the patient was informed of those risks and complications associated with the medications. These include, but are not limited to: allergic reactions (i.e.: anaphylactic or anaphylactoid reaction(s)); adrenal axis suppression; blood sugar elevation that in diabetics may result in ketoacidosis or comma; water retention that in patients with history  of congestive heart failure may result in shortness of breath, pulmonary edema, and decompensation with resultant heart failure; weight gain; swelling or edema; medication-induced neural toxicity; particulate matter embolism and blood vessel occlusion with resultant organ, and/or nervous system infarction; and/or aseptic necrosis of one or more joints. Finally, the patient was informed that Medicine is not an exact science; therefore, there is also the possibility of unforeseen or unpredictable risks and/or possible complications that may result in a catastrophic outcome. The patient indicated having understood very clearly. We have given the patient no guarantees and we have made no promises. Enough time was given to the patient to ask questions, all of which were answered to the patient's satisfaction. Mr. Mceachern has indicated that he wanted to continue with the procedure. Attestation: I, the ordering provider, attest that I have discussed with the patient the benefits, risks, side-effects, alternatives, likelihood of achieving goals, and potential problems during recovery for the procedure that I have provided informed consent. Date  Time: 10/19/2022  8:23 AM  Pre-Procedure Preparation:  Monitoring: As per clinic protocol. Respiration, ETCO2, SpO2, BP, heart rate and rhythm monitor placed and checked for adequate function Safety Precautions: Patient was assessed for positional comfort and pressure points before starting the procedure. Time-out: I initiated and conducted the "Time-out" before starting the procedure, as per protocol. The patient was asked to participate by confirming the accuracy of the "Time Out" information. Verification of the correct person, site, and procedure were performed and confirmed by me, the nursing staff, and the patient. "Time-out" conducted as per Joint Commission's Universal Protocol (UP.01.01.01). Time:    Description/Narrative of Procedure:          Laterality:  Bilateral.  Rationale (medical necessity): procedure needed and proper for the diagnosis and/or treatment of the patient's medical symptoms and needs. Procedural Technique Safety Precautions: Aspiration  looking for blood return was conducted prior to all injections. At no point did we inject any substances, as a needle was being advanced. No attempts were made at seeking any paresthesias. Safe injection practices and needle disposal techniques used. Medications properly checked for expiration dates. SDV (single dose vial) medications used. Description of the Procedure: Protocol guidelines were followed. The patient was assisted into a comfortable position. The target area was identified and the area prepped in the usual manner. Skin & deeper tissues infiltrated with local anesthetic. Appropriate amount of time allowed to pass for local anesthetics to take effect. The procedure needles were then advanced to the target area. Proper needle placement secured. Negative aspiration confirmed. Solution injected in intermittent fashion, asking for systemic symptoms every 0.5cc of injectate. The needles were then removed and the area cleansed, making sure to leave some of the prepping solution back to take advantage of its long term bactericidal properties.  Technical description of process:  C3 Medial Branch Nerve Block (MBB): The target area for the C3 dorsal medial articular branch is the lateral concave waist of the articular pillar of C3. Under fluoroscopic guidance, a Quincke needle was inserted until contact was made with os over the postero-lateral aspect of the articular pillar of C3 (target area). After negative aspiration for blood, 0.5 mL of the nerve block solution was injected without difficulty or complication. The needle was removed intact. C4 Medial Branch Nerve Block (MBB): The target area for the C4 dorsal medial articular branch is the lateral concave waist of the articular pillar of C4. Under  fluoroscopic guidance, a Quincke needle was inserted until contact was made with os over the postero-lateral aspect of the articular pillar of C4 (target area). After negative aspiration for blood, 0.5 mL of the nerve block solution was injected without difficulty or complication. The needle was removed intact. C5 Medial Branch Nerve Block (MBB): The target area for the C5 dorsal medial articular branch is the lateral concave waist of the articular pillar of C5. Under fluoroscopic guidance, a Quincke needle was inserted until contact was made with os over the postero-lateral aspect of the articular pillar of C5 (target area). After negative aspiration for blood, 0.5 mL of the nerve block solution was injected without difficulty or complication. The needle was removed intact. C6 Medial Branch Nerve Block (MBB): The target area for the C6 dorsal medial articular branch is the lateral concave waist of the articular pillar of C6. Under fluoroscopic guidance, a Quincke needle was inserted until contact was made with os over the postero-lateral aspect of the articular pillar of C6 (target area). After negative aspiration for blood, 0.5 mL of the nerve block solution was injected without difficulty or complication. The needle was removed intact. C7 Medial Branch Nerve Block (MBB): The target for the C7 dorsal medial articular branch lies on the superior-medial tip of the C7 transverse process. Under fluoroscopic guidance, a Quincke needle was inserted until contact was made with os over the postero-lateral aspect of the articular pillar of C7 (target area). After negative aspiration for blood, 0.5 mL of the nerve block solution was injected without difficulty or complication. The needle was removed intact.  Once the entire procedure was completed, the treated area was cleaned, making sure to leave some of the prepping solution back to take advantage of its long term bactericidal properties.  Anatomy Reference  Guide:       Vitals:   10/19/22 0816  BP: (!) 147/85  Pulse: 92  Resp: 16  Temp: 98.1 F (36.7 C)  TempSrc: Temporal  SpO2: 98%  Weight: 140 lb (63.5 kg)  Height: '5\' 10"'$  (1.778 m)     Start Time:   hrs. End Time:   hrs.  Imaging Guidance (Spinal):          Type of Imaging Technique: Fluoroscopy Guidance (Spinal) Indication(s): Assistance in needle guidance and placement for procedures requiring needle placement in or near specific anatomical locations not easily accessible without such assistance. Exposure Time: Please see nurses notes. Contrast: None used. Fluoroscopic Guidance: I was personally present during the use of fluoroscopy. "Tunnel Vision Technique" used to obtain the best possible view of the target area. Parallax error corrected before commencing the procedure. "Direction-depth-direction" technique used to introduce the needle under continuous pulsed fluoroscopy. Once target was reached, antero-posterior, oblique, and lateral fluoroscopic projection used confirm needle placement in all planes. Images permanently stored in EMR. Interpretation: No contrast injected. I personally interpreted the imaging intraoperatively. Adequate needle placement confirmed in multiple planes. Permanent images saved into the patient's record.  Post-operative Assessment:  Post-procedure Vital Signs:  Pulse/HCG Rate: 92  Temp: 98.1 F (36.7 C) Resp: 16 BP: (!) 147/85 (no BP meds this a.m.) SpO2: 98 %  EBL: None  Complications: No immediate post-treatment complications observed by team, or reported by patient.  Note: The patient tolerated the entire procedure well. A repeat set of vitals were taken after the procedure and the patient was kept under observation following institutional policy, for this type of procedure. Post-procedural neurological assessment was performed, showing return to baseline, prior to discharge. The patient was provided with post-procedure discharge  instructions, including a section on how to identify potential problems. Should any problems arise concerning this procedure, the patient was given instructions to immediately contact us, at any time, without hesitation. In any case, we plan to contact the patient by telephone for a follow-up status report regarding this interventional procedure.  Comments:  No additional relevant information.  Plan of Care  Orders:  Orders Placed This Encounter  Procedures   CERVICAL FACET (MEDIAL BRANCH NERVE BLOCK)     Scheduling Instructions:     Side: Bilateral     Level: C3-4, C4-5, C5-6, and C6-7 Facet joints (C3, C4, C5, C6, and C7 Medial Branch Nerves)     Sedation: Patient's choice.     Timeframe: Today    Order Specific Question:   Where will this procedure be performed?    Answer:   [facility]   DG [facility] C-ARM 1-60 MIN NO REPORT    Intraoperative interpretation by procedural physician at [place].    Standing Status:   Standing    Number of Occurrences:   1    Order Specific Question:   Reason for exam:    Answer:   Assistance in needle guidance and placement for procedures requiring needle placement in or near specific anatomical locations not easily accessible without such assistance.   Informed Consent Details: Physician/Practitioner Attestation; Transcribe to consent form and obtain patient signature    Note: Always confirm laterality of pain with Mr. Hands, before procedure. Transcribe to consent form and obtain patient signature.    Order Specific Question:   Physician/Practitioner attestation of informed consent for procedure/surgical case    Answer:   I, the physician/practitioner, attest that I have discussed with the patient the benefits, risks, side effects, alternatives, likelihood of achieving goals and potential problems during recovery for the procedure that I have  provided informed consent.    Order Specific Question:   Procedure    Answer:    Bilateral Cervical facet block under fluoroscopic guidance.    Order Specific Question:   Physician/Practitioner performing the procedure    Answer:   Jalaysia Lobb A. Dossie Arbour, MD    Order Specific Question:   Indication/Reason    Answer:   Chronic neck pain secondary to cervical facet syndrome   Provide equipment / supplies at bedside    Procedure tray: "Spinal Block Tray" (Disposable  single use)    Standing Status:   Standing    Number of Occurrences:   1    Order Specific Question:   Specify    Answer:   Spinal Tray   Chronic Opioid Analgesic:  No chronic opioid analgesics therapy prescribed by our practice.  Oxycodone/APAP 5/325 (# 20) 1 4 times daily (last filled on 08/31/2022) prescribed by Melinda Crutch, MD  MME/day: 30 mg/day   Medications ordered for procedure: Meds ordered this encounter  Medications   lidocaine (XYLOCAINE) 2 % (with pres) injection 400 mg   pentafluoroprop-tetrafluoroeth (GEBAUERS) aerosol   lactated ringers infusion   midazolam (VERSED) 5 MG/5ML injection 0.5-2 mg    Make sure Flumazenil is available in the pyxis when using this medication. If oversedation occurs, administer 0.2 mg IV over 15 sec. If after 45 sec no response, administer 0.2 mg again over 1 min; may repeat at 1 min intervals; not to exceed 4 doses (1 mg)   fentaNYL (SUBLIMAZE) injection 25-50 mcg    Make sure Narcan is available in the pyxis when using this medication. In the event of respiratory depression (RR< 8/min): Titrate NARCAN (naloxone) in increments of 0.1 to 0.2 mg IV at 2-3 minute intervals, until desired degree of reversal.   ropivacaine (PF) 2 mg/mL (0.2%) (NAROPIN) injection 18 mL   dexamethasone (DECADRON) injection 20 mg   Medications administered: Gerarda Fraction. Burdett had no medications administered during this visit.  See the medical record for exact dosing, route, and time of administration.  Follow-up plan:   Return in about 2 weeks (around 11/02/2022) for  Proc-day (T,Th), (VV), (PPE).       Interventional Therapies  Risk  Complexity Considerations:   Note: Difficult IV stick.   Planned  Pending:   Diagnostic bilateral cervical facet MBB #4    Under consideration:   Diagnostic right cervical facet C3-C7 MBB #3 & (B) C7 & T1 MBB #3  Diagnostic right cervical facet C3-C7 RFA #1 & (B) C7 & T1 RFA #1    Completed:   Diagnostic bilateral cervical facet C6-T1 MBB x1 (05/25/2022) (100/100/85/85)  Diagnostic right cervical facet C3-C5 MBB x1 (05/25/2022) (100/100/85/85)  Diagnostic right cervical facet C3-C7 MBB x1 (04/25/2022) (100/100/60/100) (100-Neck/60-H/A)  Diagnostic bilateral cervical facet C7 & T1 MBB x1 (04/25/2022) (100/100/60/100) (100-Neck/60-H/A)  Diagnostic right Cervical ESI x1 (03/23/2022) (DOS: 10-10/10) (0/0/0/0) (patient indicated having gone to the ED x4 since procedure.)   Completed by other providers:   Therapeutic right C3 and C4 medial branch RFA x1 (07/30/2020) by Sharlet Salina, DO [facility])  Diagnostic right C3 and C4 MBB x2 (06/30/2020, 07/15/2020) by Sharlet Salina, DO [facility])  Diagnostic right C3-4 zygapophyseal joint (facet joint) inj. x1 (03/05/2020) by Sharlet Salina, DO Therapeutic  ACDF C4/5, C5/6, C6/7 (07/04/2022) by Duffy Rhody, MD ([place] [facility]) (80% improvement)    Therapeutic  Palliative (PRN) options:   None established   Pharmacotherapy:  Nonopioid transferred (10/02/2022): Gabapentin (Neurontin) 300 mg, 3  tabs p.o. at bedtime; Flexeril 10 mg, 1 tab p.o. at bedtime. I believe this regimen to be appropriate for this patient however, we no longer have the manpower to continue managing medications on these patients and therefore I have kindly request that for his PCP to consider taking over it.  The patient was informed that should they decide not to, we do not plan on continuing with this regimen because of the above reasons. Recommendations:   None at this time.      Recent Visits Date Type Provider  Dept  10/02/22 Office Visit Johnathan Pointer, MD [facility]  09/04/22 Office Visit Johnathan Pointer, MD [facility]  Showing recent visits within past 90 days and meeting all other requirements Today's Visits Date Type Provider Dept  10/19/22 Procedure visit Johnathan Pointer, MD [facility]  Showing today's visits and meeting all other requirements Future Appointments Date Type Provider Dept  11/02/22 Appointment Johnathan Pointer, MD [facility]  Showing future appointments within next 90 days and meeting all other requirements  Disposition: Discharge home  Discharge (Date  Time): 10/19/2022;   hrs.   Primary Care Physician: Johnathan Aus, MD Location: [facility] Note by: Johnathan Cola, MD Date: 10/19/2022; Time: 9:05 AM  Disclaimer:  Medicine is not an Chief Strategy Officer. The only guarantee in medicine is that nothing is guaranteed. It is important to note that the decision to proceed with this intervention was based on the information collected from the patient. The Data and conclusions were drawn from the patient's questionnaire, the interview, and the physical examination. Because the information was provided in large part by the patient, it cannot be guaranteed that it has not been purposely or unconsciously manipulated. Every effort has been made to obtain as much relevant data as possible for this evaluation. It is important to note that the conclusions that lead to this procedure are derived in large part from the available data. Always take into account that the treatment will also be dependent on availability of resources and existing treatment guidelines, considered by other Pain Management Practitioners as being common knowledge and practice, at the time of the intervention. For Medico-Legal purposes, it is also important to point out that variation in procedural techniques and pharmacological choices are  the acceptable norm. The indications, contraindications, technique, and results of the above procedure should only be interpreted and judged by a Board-Certified Interventional Pain Specialist with extensive familiarity and expertise in the same exact procedure and technique.

## 2022-10-19 NOTE — Progress Notes (Signed)
Safety precautions to be maintained throughout the outpatient stay will include: orient to surroundings, keep bed in low position, maintain call bell within reach at all times, provide assistance with transfer out of bed and ambulation.  

## 2022-10-19 NOTE — Patient Instructions (Signed)

## 2022-10-20 ENCOUNTER — Telehealth: Payer: Self-pay

## 2022-10-20 NOTE — Telephone Encounter (Signed)
Post procedure follow up.  LM 

## 2022-10-30 NOTE — Patient Instructions (Addendum)
______________________________________________________________________  Preparing for your procedure  During your procedure appointment there will be: No Prescription Refills. No disability issues to discussed. No medication changes or discussions.  Instructions: Food intake: Avoid eating anything solid for at least 8 hours prior to your procedure. Clear liquid intake: You may take clear liquids such as water up to 2 hours prior to your procedure. (No carbonated drinks. No soda.) Transportation: Unless otherwise stated by your physician, bring a driver. Morning Medicines: Except for blood thinners, take all of your other morning medications with a sip of water. Make sure to take your heart and blood pressure medicines. If your blood pressure's lower number is above 100, the case will be rescheduled. Blood thinners: Make sure to stop your blood thinners as instructed.  If you take a blood thinner, but were not instructed to stop it, call our office [phone] and ask to talk to a nurse. Not stopping a blood thinner prior to certain procedures could lead to serious complications. Diabetics on insulin: Notify the staff so that you can be scheduled 1st case in the morning. If your diabetes requires high dose insulin, take only  of your normal insulin dose the morning of the procedure and notify the staff that you have done so. Preventing infections: Shower with an antibacterial soap the morning of your procedure.  Build-up your immune system: Take 1000 mg of Vitamin C with every meal (3 times a day) the day prior to your procedure. Antibiotics: Inform the nursing staff if you are taking any antibiotics or if you have any conditions that may require antibiotics prior to procedures. (Example: recent joint implants)   Pregnancy: If you are pregnant make sure to notify the nursing staff. Not doing so may result in injury to the fetus, including death.  Sickness: If you have a cold, fever, or any  active infections, call and cancel or reschedule your procedure. Receiving steroids while having an infection may result in complications. Arrival: You must be in the facility at least 30 minutes prior to your scheduled procedure. Tardiness: Your scheduled time is also the cutoff time. If you do not arrive at least 15 minutes prior to your procedure, you will be rescheduled.  Children: Do not bring any children with you. Make arrangements to keep them home. Dress appropriately: There is always a possibility that your clothing may get soiled. Avoid long dresses. Valuables: Do not bring any jewelry or valuables.  Reasons to call and reschedule or cancel your procedure: (Following these recommendations will minimize the risk of a serious complication.) Surgeries: Avoid having procedures within 2 weeks of any surgery. (Avoid for 2 weeks before or after any surgery). Flu Shots: Avoid having procedures within 2 weeks of a flu shots or . (Avoid for 2 weeks before or after immunizations). Barium: Avoid having a procedure within 7-10 days after having had a radiological study involving the use of radiological contrast. (Myelograms, Barium swallow or enema study). Heart attacks: Avoid any elective procedures or surgeries for the initial 6 months after a "Myocardial Infarction" (Heart Attack). Blood thinners: It is imperative that you stop these medications before procedures. Let us know if you if you take any blood thinner.  Infection: Avoid procedures during or within two weeks of an infection (including chest colds or gastrointestinal problems). Symptoms associated with infections include: Localized redness, fever, chills, night sweats or profuse sweating, burning sensation when voiding, cough, congestion, stuffiness, runny nose, sore throat, diarrhea, nausea, vomiting, cold or Flu symptoms, recent or   current infections. It is specially important if the infection is over the area that we intend to treat. Heart  and lung problems: Symptoms that may suggest an active cardiopulmonary problem include: cough, chest pain, breathing difficulties or shortness of breath, dizziness, ankle swelling, uncontrolled high or unusually low blood pressure, and/or palpitations. If you are experiencing any of these symptoms, cancel your procedure and contact your primary care physician for an evaluation.  Remember:  Regular Business hours are:  Monday to Thursday 8:00 AM to 4:00 PM  Provider's Schedule: Milinda Pointer, MD:  Procedure days: Tuesday and Thursday 7:30 AM to 4:00 PM  Gillis Santa, MD:  Procedure days: Monday and Wednesday 7:30 AM to 4:00 PM  ______________________________________________________________________    ____________________________________________________________________________________________  General Risks and Possible Complications  Patient Responsibilities: It is important that you read this as it is part of your informed consent. It is our duty to inform you of the risks and possible complications associated with treatments offered to you. It is your responsibility as a patient to read this and to ask questions about anything that is not clear or that you believe was not covered in this document.  Patient's Rights: You have the right to refuse treatment. You also have the right to change your mind, even after initially having agreed to have the treatment done. However, under this last option, if you wait until the last second to change your mind, you may be charged for the materials used up to that point.  Introduction: Medicine is not an Chief Strategy Officer. Everything in Medicine, including the lack of treatment(s), carries the potential for danger, harm, or loss (which is by definition: Risk). In Medicine, a complication is a secondary problem, condition, or disease that can aggravate an already existing one. All treatments carry the risk of possible complications. The fact that a side  effects or complications occurs, does not imply that the treatment was conducted incorrectly. It must be clearly understood that these can happen even when everything is done following the highest safety standards.  No treatment: You can choose not to proceed with the proposed treatment alternative. The "PRO(s)" would include: avoiding the risk of complications associated with the therapy. The "CON(s)" would include: not getting any of the treatment benefits. These benefits fall under one of three categories: diagnostic; therapeutic; and/or palliative. Diagnostic benefits include: getting information which can ultimately lead to improvement of the disease or symptom(s). Therapeutic benefits are those associated with the successful treatment of the disease. Finally, palliative benefits are those related to the decrease of the primary symptoms, without necessarily curing the condition (example: decreasing the pain from a flare-up of a chronic condition, such as incurable terminal cancer).  General Risks and Complications: These are associated to most interventional treatments. They can occur alone, or in combination. They fall under one of the following six (6) categories: no benefit or worsening of symptoms; bleeding; infection; nerve damage; allergic reactions; and/or death. No benefits or worsening of symptoms: In Medicine there are no guarantees, only probabilities. No healthcare provider can ever guarantee that a medical treatment will work, they can only state the probability that it may. Furthermore, there is always the possibility that the condition may worsen, either directly, or indirectly, as a consequence of the treatment. Bleeding: This is more common if the patient is taking a blood thinner, either prescription or over the counter (example: Goody Powders, Fish oil, Aspirin, Garlic, etc.), or if suffering a condition associated with impaired coagulation (example: Hemophilia, cirrhosis  of the liver,  low platelet counts, etc.). However, even if you do not have one on these, it can still happen. If you have any of these conditions, or take one of these drugs, make sure to notify your treating physician. Infection: This is more common in patients with a compromised immune system, either due to disease (example: diabetes, cancer, human immunodeficiency virus [HIV], etc.), or due to medications or treatments (example: therapies used to treat cancer and rheumatological diseases). However, even if you do not have one on these, it can still happen. If you have any of these conditions, or take one of these drugs, make sure to notify your treating physician. Nerve Damage: This is more common when the treatment is an invasive one, but it can also happen with the use of medications, such as those used in the treatment of cancer. The damage can occur to small secondary nerves, or to large primary ones, such as those in the spinal cord and brain. This damage may be temporary or permanent and it may lead to impairments that can range from temporary numbness to permanent paralysis and/or brain death. Allergic Reactions: Any time a substance or material comes in contact with our body, there is the possibility of an allergic reaction. These can range from a mild skin rash (contact dermatitis) to a severe systemic reaction (anaphylactic reaction), which can result in death. Death: In general, any medical intervention can result in death, most of the time due to an unforeseen complication. ____________________________________________________________________________________________    ____________________________________________________________________________________________  Patient Information update  To: All of our patients.  Re: Name change.  It has been made official that our current name, "[facility]"   will soon be changed to "[facility]".   The purpose of this change is to eliminate any confusion created by the concept of our practice being a "Medication [facility]". In the past this has led to the misconception that we treat pain primarily by the use of prescription medications.  Nothing can be farther from the truth.   Understanding PAIN MANAGEMENT: To further understand what our practice does, you first have to understand that "Pain Management" is a subspecialty that requires additional training once a physician has completed their specialty training, which can be in either Anesthesia, Neurology, Psychiatry, or Physical Medicine and Rehabilitation (PMR). Each one of these contributes to the final approach taken by each physician to the management of their patient's pain. To be a "Pain Management Specialist" you must have first completed one of the specialty trainings below.  Anesthesiologists - trained in clinical pharmacology and interventional techniques such as nerve blockade and regional as well as central neuroanatomy. They are trained to block pain before, during, and after surgical interventions.  Neurologists - trained in the diagnosis and pharmacological treatment of complex neurological conditions, such as Multiple Sclerosis, Parkinson's, spinal cord injuries, and other systemic conditions that may be associated with symptoms that may include but are not limited to pain. They tend to rely primarily on the treatment of chronic pain using prescription medications.  Psychiatrist - trained in conditions affecting the psychosocial wellbeing of patients including but not limited to depression, anxiety, schizophrenia, personality disorders, addiction, and other substance use disorders that may be associated with chronic pain. They tend to rely primarily on the treatment of chronic pain using prescription medications.   Physical Medicine and Rehabilitation (PMR) physicians,  also known as physiatrists -  trained to treat a wide variety of medical conditions affecting the brain, spinal cord, nerves, bones, joints, ligaments, muscles, and tendons. Their training is primarily aimed at treating patients that have suffered injuries that have caused severe physical impairment. Their training is primarily aimed at the physical therapy and rehabilitation of those patients. They may also work alongside orthopedic surgeons or neurosurgeons using their expertise in assisting surgical patients to recover after their surgeries.  INTERVENTIONAL PAIN MANAGEMENT is sub-subspecialty of Pain Management.  Our physicians are Board-certified in Anesthesia, Pain Management, and Interventional Pain Management.  This meaning that not only have they been trained and Board-certified in their specialty of Anesthesia, and subspecialty of Pain Management, but they have also received further training in the sub-subspecialty of Interventional Pain Management, in order to become Board-certified as INTERVENTIONAL PAIN MANAGEMENT SPECIALIST.    Mission: Our goal is to use our skills in  [place] as alternatives to the chronic use of prescription opioid medications for the treatment of pain. To make this more clear, we have changed our name to reflect what we do and offer. We will continue to offer medication management assessment and recommendations, but we will not be taking over any patient's medication management.  ____________________________________________________________________________________________

## 2022-11-02 ENCOUNTER — Ambulatory Visit: Payer: No Typology Code available for payment source | Attending: Pain Medicine | Admitting: Pain Medicine

## 2022-11-02 DIAGNOSIS — M47812 Spondylosis without myelopathy or radiculopathy, cervical region: Secondary | ICD-10-CM

## 2022-11-02 DIAGNOSIS — G4486 Cervicogenic headache: Secondary | ICD-10-CM

## 2022-11-02 DIAGNOSIS — M4312 Spondylolisthesis, cervical region: Secondary | ICD-10-CM | POA: Diagnosis not present

## 2022-11-02 DIAGNOSIS — M5030 Other cervical disc degeneration, unspecified cervical region: Secondary | ICD-10-CM | POA: Diagnosis not present

## 2022-11-02 DIAGNOSIS — M542 Cervicalgia: Secondary | ICD-10-CM

## 2022-11-02 NOTE — Progress Notes (Signed)
Patient: Johnathan Harvey  Service Category: E/M  Provider: Gaspar Cola, MD  DOB: 10-Apr-1956  DOS: 11/02/2022  Location: Office  MRN: [id number]  Setting: Ambulatory outpatient  Referring Provider: Rusty Aus, MD  Type: Established Patient  Specialty: Interventional Pain Management  PCP: Johnathan Aus, MD  Location: Remote location  Delivery: TeleHealth     Virtual Encounter - Pain Management PROVIDER NOTE: Information contained herein reflects review and annotations entered in association with encounter. Interpretation of such information and data should be left to medically-trained personnel. Information provided to patient can be located elsewhere in the medical record under "Patient Instructions". Document created using STT-dictation technology, any transcriptional errors that may result from process are unintentional.    Contact & Pharmacy Preferred: [phone] Home: [phone] (home) Mobile: [phone] (mobile) E-mail: [email or web address]  [id number] Johnathan Harvey, [address] [place] [address] Phone: [phone] Fax: [place], [place] - [address] [facility] [place] [address] Phone: [phone] Fax: [phone]   Pre-screening  Johnathan Harvey offered "in-person" vs "virtual" encounter. He indicated preferring virtual for this encounter.   Reason COVID-19*  Social distancing based on CDC and AMA recommendations.   I contacted Johnathan Harvey on 11/02/2022 via telephone.      I clearly identified myself as Johnathan Cola, MD. I verified that I was speaking with the correct person using two identifiers (Name: Johnathan Harvey, and date of birth: 08-23-1956).  Consent I sought verbal advanced consent from Johnathan Harvey for virtual visit interactions. I informed Johnathan Harvey of possible security  and privacy concerns, risks, and limitations associated with providing "not-in-person" medical evaluation and management services. I also informed Johnathan Harvey of the availability of "in-person" appointments. Finally, I informed him that there would be a charge for the virtual visit and that he could be  personally, fully or partially, financially responsible for it. Johnathan Harvey expressed understanding and agreed to proceed.   Historic Elements   Johnathan Harvey is a 67 y.o. year old, male patient evaluated today after our last contact on 10/19/2022. Johnathan Harvey  has a past medical history of Arthritis, Esophageal stricture, GERD (gastroesophageal reflux disease), History of MRSA infection (2016), Hypertension, Migraine, and Skin cancer, basal cell. He also  has a past surgical history that includes Ankle arthroplasty; Esophagogastroduodenoscopy (N/A, 11/04/2018); and Inguinal hernia repair (Left, 09/26/2019). Johnathan Harvey has a current medication list which includes the following prescription(s): amlodipine, cyclobenzaprine, gabapentin, ondansetron, percocet, and PRESCRIPTION MEDICATION. He  reports that he has quit smoking. His smoking use included cigarettes. He has never used smokeless tobacco. He reports that he does not currently use drugs after having used the following drugs: Marijuana. He reports that he does not drink alcohol. Johnathan Harvey is allergic to protonix [pantoprazole].  Estimated body mass index is 20.09 kg/m as calculated from the following:   Height as of 10/19/22: '5\' 10"'$  (1.778 m).   Weight as of 10/19/22: 140 lb (63.5 kg).  HPI  Today, he is being contacted for a post-procedure assessment.  The patient is being contacted for follow-up on his fourth diagnostic cervical facet block.  He again indicated having attained 100% relief of the pain in the area of the neck, the headaches, and the shoulder pain with the bilateral cervical facet blocks.  Unfortunately, he refers that  once the local anesthetic wore off the benefit went down to only an ongoing 35% improvement of the neck pain.  However, he refers having no headaches and the neck pain he describes it now as being a soreness over both of his shoulders.  While the pain was constant and severe in the past, after the treatments that we have provided him he refers that this is more of an intermittent type of pain.  While in the past he was unable to sleep at night, now he refers that he can but he still wakes up after approximately 4 hours with some of the neck pain.  Based on this results, I think that he would be a good candidate for radiofrequency ablation of the cervical facet joints.  On 07/30/2020 he had a radiofrequency ablation done by Johnathan Harvey on the C3 and C4 cervical medial branches on the right side.  This was 3 years ago and since then his condition has been documented to have worsened where the patient currently has a grade 1 anterolisthesis of C3 over C4 and C7 over T1 as well as MRI evidence of cervical facet arthropathy as well as hypertrophy at multiple levels, bilaterally.  He has already undergone conservative therapy and failed attempts at physical therapy.  He has responded well to the diagnostic cervical facet blocks and for this reason I believe it to be medically indicated and necessary to proceed with radiofrequency ablation of the cervical medial branches.  At this time he will need to involve more levels.  We will do 1 side at a time starting with the right side since he refers that the headaches seems to be worse on the right side as well as the pain in the shoulder area.  The plan is to do the radiofrequency ablation of the C3-4 through C7-T1 facet joints.  The plan was shared with the patient who understood and agreed with it.  Post-procedure evaluation   Type: Cervical Facet Medial Branch Block(s) #4  Laterality: Left  Level: C6, C7, and C8 Medial Branch Level(s). Injecting these levels  blocks the C6-7 and C7-T1 cervical facet joints.  Laterality: Right  Level: C3, C4, C5, C6, C7, and C8 Medial Branch Level(s) blocking the C3-4, C4-5, C5-6, C6-7, and C7-T1 facet joints.  Imaging: Fluoroscopic guidance Anesthesia: Local anesthesia (1-2% Lidocaine) Anxiolysis: IV Versed         Sedation: Moderate Sedation                       DOS: 10/19/2022  Performed by: Johnathan Cola, MD  Purpose: Diagnostic/Therapeutic Indications: Cervicalgia (cervical spine axial pain) severe enough to impact quality of life or function. 1. Cervical facet syndrome   2. 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 Cervical facet hypertrophy (Multilevel) (Bilateral)   4. Spondylosis without myelopathy or radiculopathy, cervical region   5. Cervical facet arthropathy (Multilevel) (Bilateral)   6. Cervicalgia (1ry area of Pain)   7. Cervicogenic headache (2ry area of Pain) (Right)   8. Chronic neck and back pain   9. DDD (degenerative disc disease), cervical    NAS-11 Pain score:   Pre-procedure: 2 /10   Post-procedure: 2 /10      Effectiveness:  Initial hour after procedure: 100 %. Subsequent 4-6 hours post-procedure: 100 %. Analgesia past initial 6 hours: 35 % (ongoing). Ongoing improvement:  Analgesic: The patient indicates having an ongoing 35% improvement of the neck pain with  100% relief of the cervicogenic headache and a change of the shoulder pain to what he calls a "discomfort". Function: Johnathan Harvey reports improvement in function ROM: Johnathan Harvey reports improvement in ROM  Pharmacotherapy Assessment   Opioid Analgesic: No chronic opioid analgesics therapy prescribed by our practice.  Oxycodone/APAP 5/325 (# 20) 1 4 times daily (last filled on 08/31/2022) prescribed by Melinda Crutch, MD  MME/day: 30 mg/day   Monitoring: [place] PMP: PDMP reviewed during this encounter.       Pharmacotherapy: No side-effects or adverse reactions  reported. Compliance: No problems identified. Effectiveness: Clinically acceptable. Plan: Refer to "POC". UDS: No results found for: "SUMMARY" No results found for: "CBDTHCR", "D8THCCBX", "D9THCCBX"   Laboratory Chemistry Profile   Renal Lab Results  Component Value Date   BUN 21 03/17/2022   CREATININE 0.80 03/17/2022   GFRAA >60 10/10/2019   GFRNONAA >60 03/17/2022    Hepatic Lab Results  Component Value Date   AST 20 03/17/2022   ALT 13 03/17/2022   ALBUMIN 4.7 03/17/2022   ALKPHOS 62 03/17/2022   LIPASE 39 03/17/2022    Electrolytes Lab Results  Component Value Date   NA 137 03/17/2022   K 4.3 03/17/2022   CL 105 03/17/2022   CALCIUM 9.3 03/17/2022   MG 2.1 03/11/2021    Bone No results found for: "VD25OH", "VD125OH2TOT", "MH6808UP1", "SR1594VO5", "25OHVITD1", "25OHVITD2", "25OHVITD3", "TESTOFREE", "TESTOSTERONE"  Inflammation (CRP: Acute Phase) (ESR: Chronic Phase) Lab Results  Component Value Date   LATICACIDVEN 0.8 10/10/2019         Note: Above Lab results reviewed.  Imaging  DG PAIN CLINIC C-ARM 1-60 MIN NO REPORT Fluoro was used, but no Radiologist interpretation will be provided.  Please refer to "NOTES" tab for provider progress note.  Assessment  The primary encounter diagnosis was Cervical facet syndrome. Diagnoses of Grade 1 Anterolisthesis of cervical spine (C3/C4 & C7/T1), Cervical facet hypertrophy (Multilevel) (Bilateral), Cervical facet arthropathy (Multilevel) (Bilateral), Cervicalgia (1ry area of Pain), Cervicogenic headache (2ry area of Pain) (Right), and DDD (degenerative disc disease), cervical were also pertinent to this visit.  Plan of Care  Problem-specific:  No problem-specific Assessment & Plan notes found for this encounter.  Johnathan Harvey has a current medication list which includes the following long-term medication(s): amlodipine, cyclobenzaprine, and gabapentin.  Pharmacotherapy (Medications Ordered): No orders of  the defined types were placed in this encounter.  Orders:  Orders Placed This Encounter  Procedures   Radiofrequency,Cervical    Standing Status:   Future    Standing Expiration Date:   03/03/2023    Scheduling Instructions:     Side(s): Right-sided     Level(s): C3, C4, C5, C6, C7, and C8 Medial Branch Nerves     Sedation: With Sedation.     Scheduling Timeframe: As soon as pre-approved    Order Specific Question:   Where will this procedure be performed?    Answer:   [facility]   Follow-up plan:   Return for (88mn), (ECT): (R) C-FCT RFA #1.     Interventional Therapies  Risk  Complexity Considerations:   Note: Difficult IV stick.   Planned  Pending:   Therapeutic right C3-C8 cervical MB RFA #1    Under consideration:   Diagnostic right cervical facet C3-C7 RFA #1 & (B) C7 & T1 RFA #1    Completed:   Diagnostic bilateral cervical facet MBB x4 (10/19/2022) (100/100/35/35)  Diagnostic bilateral cervical facet C6-T1 MBB x1 (05/25/2022) (100/100/85/85)  Diagnostic  right cervical facet C3-C5 MBB x1 (05/25/2022) (100/100/85/85)  Diagnostic right cervical facet C3-C7 MBB x1 (04/25/2022) (100/100/60/100) (100-Neck/60-H/A)  Diagnostic bilateral cervical facet C7 & T1 MBB x1 (04/25/2022) (100/100/60/100) (100-Neck/60-H/A)  Diagnostic right Cervical ESI x1 (03/23/2022) (DOS: 10-10/10) (0/0/0/0) (patient indicated having gone to the ED x4 since procedure.)   Completed by other providers:   Therapeutic right C3 and C4 medial branch RFA x1 (07/30/2020) by Sharlet Salina, DO [facility])  Diagnostic right C3 and C4 MBB x2 (06/30/2020, 07/15/2020) by Sharlet Salina, DO [facility])  Diagnostic right C3-4 zygapophyseal joint (facet joint) inj. x1 (03/05/2020) by Sharlet Salina, DO Therapeutic  ACDF C4/5, C5/6, C6/7 (07/04/2022) by Duffy Rhody, MD ([place] [facility]) (80% improvement)    Therapeutic  Palliative (PRN) options:   None established   Pharmacotherapy:  Nonopioid transferred  (10/02/2022): Gabapentin (Neurontin) 300 mg, 3 tabs p.o. at bedtime; Flexeril 10 mg, 1 tab p.o. at bedtime. I believe this regimen to be appropriate for this patient however, we no longer have the manpower to continue managing medications on these patients and therefore I have kindly request that for his PCP to consider taking over it.  The patient was informed that should they decide not to, we do not plan on continuing with this regimen because of the above reasons. Recommendations:   None at this time.     Recent Visits Date Type Provider Dept  10/19/22 Procedure visit Milinda Pointer, MD [facility]  10/02/22 Office Visit Milinda Pointer, MD [facility]  09/04/22 Office Visit Milinda Pointer, MD [facility]  Showing recent visits within past 90 days and meeting all other requirements Today's Visits Date Type Provider Dept  11/02/22 Office Visit Milinda Pointer, MD [facility]  Showing today's visits and meeting all other requirements Future Appointments No visits were found meeting these conditions. Showing future appointments within next 90 days and meeting all other requirements  I discussed the assessment and treatment plan with the patient. The patient was provided an opportunity to ask questions and all were answered. The patient agreed with the plan and demonstrated an understanding of the instructions.  Patient advised to call back or seek an in-person evaluation if the symptoms or condition worsens.  Duration of encounter: 15 minutes.  Note by: Johnathan Cola, MD Date: 11/02/2022; Time: 12:14 PM

## 2022-11-13 NOTE — Progress Notes (Unsigned)
PROVIDER NOTE: Interpretation of information contained herein should be left to medically-trained personnel. Specific patient instructions are provided elsewhere under "Patient Instructions" section of medical record. This document was created in part using STT-dictation technology, any transcriptional errors that may result from this process are unintentional.  Patient: Johnathan Harvey Type: Established DOB: [birth year] MRN: [id number] PCP: Rusty Aus, MD  Service: Procedure DOS: 11/14/2022 Setting: Ambulatory Location: Ambulatory outpatient facility Delivery: Face-to-face Provider: Gaspar Cola, MD Specialty: Interventional Pain Management Specialty designation: 09 Location: Outpatient facility Ref. Prov.: Milinda Pointer, MD    Primary Reason for Visit: Interventional Pain Management Treatment. CC: No chief complaint on file.   Procedure:               Type: Cervical Facet, Medial Branch Radiofrequency Ablation (RFA)  #1  Laterality: Right (-RT)  Level: TON, C3, C4, C5, C6, C7, C8, and T1 Medial Branch Level(s). Lesioning of these levels should completely denervate the C2-3, C3-4, C4-5, C5-6, C6-7, and C7-T1 cervical facet joints.  Imaging: Fluoroscopy-guided         Anesthesia: Local anesthesia (1-2% Lidocaine) Anxiolysis: IV Versed         Sedation:                         DOS: 11/14/2022  Performed by: Gaspar Cola, MD  Purpose: Therapeutic/Palliative Indications: Chronic neck pain (cervicalgia) severe enough to impact quality of life or function. Rationale (medical necessity): procedure needed and proper for the diagnosis and/or treatment of Johnathan Harvey's medical symptoms and needs. Indications: No diagnosis found. Johnathan Harvey has been dealing with the above chronic pain for longer than three months and has either failed to respond, was unable to tolerate, or simply did not get enough benefit from other more conservative therapies including, but not  limited to: 1. Over-the-counter medications 2. Anti-inflammatory medications 3. Muscle relaxants 4. Membrane stabilizers 5. Opioids 6. Physical therapy and/or chiropractic manipulation 7. Modalities (Heat, ice, etc.) 8. Invasive techniques such as nerve blocks. Johnathan Harvey has attained more than 50% relief of the pain from a series of diagnostic injections conducted in separate occasions.  Pain Score: Pre-procedure:  /10 Post-procedure:  /10    Target: Cervical medial nerve  Location: Posterolateral aspect of the waist of the cervical articular pillar. Region: Cervical  Approach: Paramedial  Type of procedure: Radiofrequency Neurolytic Ablation   Position / Prep / Materials:  Position: Prone with head of the table was raised to facilitate breathing.  Prep solution: DuraPrep (Iodine Povacrylex [0.7% available iodine] and Isopropyl Alcohol, 74% w/w) Prep Area: Entire Posterior Cervical Region  Materials:  Tray:  RFA (Radiofrequency) tray Needle(s):  Type: RFA (Teflon-coated radiofrequency ablation needles)  Gauge (G): 22  Length: Short (5cm)  Qty: 5  Pre-op H&P Assessment:  Johnathan Harvey is a 67 y.o. (year old), male patient, seen today for interventional treatment. He  has a past surgical history that includes Ankle arthroplasty; Esophagogastroduodenoscopy (N/A, 11/04/2018); and Inguinal hernia repair (Left, 09/26/2019). Johnathan Harvey has a current medication list which includes the following prescription(s): amlodipine, cyclobenzaprine, gabapentin, ondansetron, percocet, and PRESCRIPTION MEDICATION. His primarily concern today is the No chief complaint on file.  Initial Vital Signs:  Pulse/HCG Rate:    Temp:   Resp:   BP:   SpO2:    BMI: Estimated body mass index is 20.09 kg/m as calculated from the following:   Height as of 10/19/22: '5\' 10"'$  (1.778 m).   Weight as  of 10/19/22: 140 lb (63.5 kg).  Risk Assessment: Allergies: Reviewed. He is allergic to protonix  [pantoprazole].  Allergy Precautions: None required Coagulopathies: Reviewed. None identified.  Blood-thinner therapy: None at this time Active Infection(s): Reviewed. None identified. Johnathan Harvey is afebrile  Site Confirmation: Johnathan Harvey was asked to confirm the procedure and laterality before marking the site Procedure checklist: Completed Consent: Before the procedure and under the influence of no sedative(s), amnesic(s), or anxiolytics, the patient was informed of the treatment options, risks and possible complications. To fulfill our ethical and legal obligations, as recommended by the American Medical Association's Code of Ethics, I have informed the patient of my clinical impression; the nature and purpose of the treatment or procedure; the risks, benefits, and possible complications of the intervention; the alternatives, including doing nothing; the risk(s) and benefit(s) of the alternative treatment(s) or procedure(s); and the risk(s) and benefit(s) of doing nothing. The patient was provided information about the general risks and possible complications associated with the procedure. These may include, but are not limited to: failure to achieve desired goals, infection, bleeding, organ or nerve damage, allergic reactions, paralysis, and death. In addition, the patient was informed of those risks and complications associated to Spine-related procedures, such as failure to decrease pain; infection (i.e.: Meningitis, epidural or intraspinal abscess); bleeding (i.e.: epidural hematoma, subarachnoid hemorrhage, or any other type of intraspinal or peri-dural bleeding); organ or nerve damage (i.e.: Any type of peripheral nerve, nerve root, or spinal cord injury) with subsequent damage to sensory, motor, and/or autonomic systems, resulting in permanent pain, numbness, and/or weakness of one or several areas of the body; allergic reactions; (i.e.: anaphylactic reaction); and/or death. Furthermore,  the patient was informed of those risks and complications associated with the medications. These include, but are not limited to: allergic reactions (i.e.: anaphylactic or anaphylactoid reaction(s)); adrenal axis suppression; blood sugar elevation that in diabetics may result in ketoacidosis or comma; water retention that in patients with history of congestive heart failure may result in shortness of breath, pulmonary edema, and decompensation with resultant heart failure; weight gain; swelling or edema; medication-induced neural toxicity; particulate matter embolism and blood vessel occlusion with resultant organ, and/or nervous system infarction; and/or aseptic necrosis of one or more joints. Finally, the patient was informed that Medicine is not an exact science; therefore, there is also the possibility of unforeseen or unpredictable risks and/or possible complications that may result in a catastrophic outcome. The patient indicated having understood very clearly. We have given the patient no guarantees and we have made no promises. Enough time was given to the patient to ask questions, all of which were answered to the patient's satisfaction. Johnathan Harvey has indicated that he wanted to continue with the procedure. Attestation: I, the ordering provider, attest that I have discussed with the patient the benefits, risks, side-effects, alternatives, likelihood of achieving goals, and potential problems during recovery for the procedure that I have provided informed consent. Date  Time: {CHL [facility]-PAIN TIME CHOICES:21018001}  Pre-Procedure Preparation:  Monitoring: As per clinic protocol. Respiration, ETCO2, SpO2, BP, heart rate and rhythm monitor placed and checked for adequate function Safety Precautions: Patient was assessed for positional comfort and pressure points before starting the procedure. Time-out: I initiated and conducted the "Time-out" before starting the procedure, as per protocol. The  patient was asked to participate by confirming the accuracy of the "Time Out" information. Verification of the correct person, site, and procedure were performed and confirmed by me, the nursing staff, and the  patient. "Time-out" conducted as per Joint Commission's Universal Protocol (UP.01.01.01). Time:    Description/Narrative of Procedure:          Rationale (medical necessity): procedure needed and proper for the diagnosis and/or treatment of the patient's medical symptoms and needs. Procedural Technique Safety Precautions: Aspiration looking for blood return was conducted prior to all injections. At no point did we inject any substances, as a needle was being advanced. No attempts were made at seeking any paresthesias. Safe injection practices and needle disposal techniques used. Medications properly checked for expiration dates. SDV (single dose vial) medications used. Description of the Procedure: Protocol guidelines were followed. The patient was assisted into a comfortable position. The target area was identified and the area prepped in the usual manner. Skin & deeper tissues infiltrated with local anesthetic. Appropriate amount of time allowed to pass for local anesthetics to take effect. The procedure needles were then advanced to the target area. Proper needle placement secured. Negative aspiration confirmed. Solution injected in intermittent fashion, asking for systemic symptoms every 0.5cc of injectate. The needles were then removed and the area cleansed, making sure to leave some of the prepping solution back to take advantage of its long term bactericidal properties.  Technical description of procedure:  Laterality: Right Radiofrequency Ablation (RFA) C3 Medial Branch Nerve RFA: The target area for the C3 dorsal medial articular branch is the lateral concave waist of the articular pillar of C3. Under fluoroscopic guidance, a Radiofrequency needle was inserted until contact was made with os  over the postero-lateral aspect of the articular pillar of C3 (target area). Sensory and motor testing was conducted to properly adjust the position of the needle. Once satisfactory placement of the needle was achieved, the numbing solution was slowly injected after negative aspiration for blood. 2.0 mL of the nerve block solution was injected without difficulty or complication. After waiting for at least 3 minutes, the ablation was performed. Once completed, the needle was removed intact. C4 Medial Branch Nerve RFA: The target area for the C4 dorsal medial articular branch is the lateral concave waist of the articular pillar of C4. Under fluoroscopic guidance, a Radiofrequency needle was inserted until contact was made with os over the postero-lateral aspect of the articular pillar of C4 (target area). Sensory and motor testing was conducted to properly adjust the position of the needle. Once satisfactory placement of the needle was achieved, the numbing solution was slowly injected after negative aspiration for blood. 2.0 mL of the nerve block solution was injected without difficulty or complication. After waiting for at least 3 minutes, the ablation was performed. Once completed, the needle was removed intact. C5 Medial Branch Nerve RFA: The target area for the C5 dorsal medial articular branch is the lateral concave waist of the articular pillar of C5. Under fluoroscopic guidance, a Radiofrequency needle was inserted until contact was made with os over the postero-lateral aspect of the articular pillar of C5 (target area). Sensory and motor testing was conducted to properly adjust the position of the needle. Once satisfactory placement of the needle was achieved, the numbing solution was slowly injected after negative aspiration for blood. 2.0 mL of the nerve block solution was injected without difficulty or complication. After waiting for at least 3 minutes, the ablation was performed. Once completed, the  needle was removed intact. C6 Medial Branch Nerve RFA: The target area for the C6 dorsal medial articular branch is the lateral concave waist of the articular pillar of C6. Under fluoroscopic guidance,  a Radiofrequency needle was inserted until contact was made with os over the postero-lateral aspect of the articular pillar of C6 (target area). Sensory and motor testing was conducted to properly adjust the position of the needle. Once satisfactory placement of the needle was achieved, the numbing solution was slowly injected after negative aspiration for blood. 2.0 mL of the nerve block solution was injected without difficulty or complication. After waiting for at least 3 minutes, the ablation was performed. Once completed, the needle was removed intact. C7 Medial Branch Nerve RFA: The target for the C7 dorsal medial articular branch lies on the superior-medial tip of the C7 transverse process. Under fluoroscopic guidance, a Radiofrequency needle was inserted until contact was made with os over the postero-lateral aspect of the articular pillar of C7 (target area). Sensory and motor testing was conducted to properly adjust the position of the needle. Once satisfactory placement of the needle was achieved, the numbing solution was slowly injected after negative aspiration for blood. 2.0 mL of the nerve block solution was injected without difficulty or complication. After waiting for at least 3 minutes, the ablation was performed. Once completed, the needle was removed intact. Radiofrequency lesioning (ablation):  Radiofrequency Generator: Medtronic AccurianTM AG 1000 RF Generator Sensory Stimulation Parameters: 50 Hz was used to locate & identify the nerve, making sure that the needle was positioned such that there was no sensory stimulation below 0.3 V or above 0.7 V. Motor Stimulation Parameters: 2 Hz was used to evaluate the motor component. Care was taken not to lesion any nerves that demonstrated motor  stimulation of the lower extremities at an output of less than 2.5 times that of the sensory threshold, or a maximum of 2.0 V. Lesioning Technique Parameters: Standard Radiofrequency settings. (Not bipolar or pulsed.) Temperature Settings: 80 degrees C Lesioning time: 60 seconds Intra-operative Compliance: Compliant   Once the entire procedure was completed, the treated area was cleaned, making sure to leave some of the prepping solution back to take advantage of its long term bactericidal properties. Intra-operative Compliance: Compliant  Anatomy Reference Guide:           There were no vitals filed for this visit.   Start Time:   hrs. End Time:   hrs.  Imaging Guidance (Spinal):          Type of Imaging Technique: Fluoroscopy Guidance (Spinal) Indication(s): Assistance in needle guidance and placement for procedures requiring needle placement in or near specific anatomical locations not easily accessible without such assistance. Exposure Time: Please see nurses notes. Contrast: None used. Fluoroscopic Guidance: I was personally present during the use of fluoroscopy. "Tunnel Vision Technique" used to obtain the best possible view of the target area. Parallax error corrected before commencing the procedure. "Direction-depth-direction" technique used to introduce the needle under continuous pulsed fluoroscopy. Once target was reached, antero-posterior, oblique, and lateral fluoroscopic projection used confirm needle placement in all planes. Images permanently stored in EMR. Interpretation: No contrast injected. I personally interpreted the imaging intraoperatively. Adequate needle placement confirmed in multiple planes. Permanent images saved into the patient's record.  Post-operative Assessment:  Post-procedure Vital Signs:  Pulse/HCG Rate:    Temp:   Resp:   BP:   SpO2:    EBL: None  Complications: No immediate post-treatment complications observed by team, or reported by  patient.  Note: The patient tolerated the entire procedure well. A repeat set of vitals were taken after the procedure and the patient was kept under observation following institutional policy, for  this type of procedure. Post-procedural neurological assessment was performed, showing return to baseline, prior to discharge. The patient was provided with post-procedure discharge instructions, including a section on how to identify potential problems. Should any problems arise concerning this procedure, the patient was given instructions to immediately contact us, at any time, without hesitation. In any case, we plan to contact the patient by telephone for a follow-up status report regarding this interventional procedure.  Comments:  No additional relevant information.  Plan of Care  Orders:  No orders of the defined types were placed in this encounter.  Chronic Opioid Analgesic:  No chronic opioid analgesics therapy prescribed by our practice.  Oxycodone/APAP 5/325 (# 20) 1 4 times daily (last filled on 08/31/2022) prescribed by Melinda Crutch, MD  MME/day: 30 mg/day   Medications ordered for procedure: No orders of the defined types were placed in this encounter.  Medications administered: Gerarda Fraction. Macmillan had no medications administered during this visit.  See the medical record for exact dosing, route, and time of administration.  Follow-up plan:   No follow-ups on file.       Interventional Therapies  Risk  Complexity Considerations:   Note: Difficult IV stick.   Planned  Pending:   Therapeutic right C3-C8 cervical MB RFA #1    Under consideration:   Diagnostic right cervical facet C3-C7 RFA #1 & (B) C7 & T1 RFA #1    Completed:   Diagnostic bilateral cervical facet MBB x4 (10/19/2022) (100/100/35/35)  Diagnostic bilateral cervical facet C6-T1 MBB x1 (05/25/2022) (100/100/85/85)  Diagnostic right cervical facet C3-C5 MBB x1 (05/25/2022) (100/100/85/85)  Diagnostic  right cervical facet C3-C7 MBB x1 (04/25/2022) (100/100/60/100) (100-Neck/60-H/A)  Diagnostic bilateral cervical facet C7 & T1 MBB x1 (04/25/2022) (100/100/60/100) (100-Neck/60-H/A)  Diagnostic right Cervical ESI x1 (03/23/2022) (DOS: 10-10/10) (0/0/0/0) (patient indicated having gone to the ED x4 since procedure.)   Completed by other providers:   Therapeutic right C3 and C4 medial branch RFA x1 (07/30/2020) by Sharlet Salina, DO [facility])  Diagnostic right C3 and C4 MBB x2 (06/30/2020, 07/15/2020) by Sharlet Salina, DO [facility])  Diagnostic right C3-4 zygapophyseal joint (facet joint) inj. x1 (03/05/2020) by Sharlet Salina, DO Therapeutic  ACDF C4/5, C5/6, C6/7 (07/04/2022) by Duffy Rhody, MD ([place] [facility]) (80% improvement)    Therapeutic  Palliative (PRN) options:   None established   Pharmacotherapy:  Nonopioid transferred (10/02/2022): Gabapentin (Neurontin) 300 mg, 3 tabs p.o. at bedtime; Flexeril 10 mg, 1 tab p.o. at bedtime. I believe this regimen to be appropriate for this patient however, we no longer have the manpower to continue managing medications on these patients and therefore I have kindly request that for his PCP to consider taking over it.  The patient was informed that should they decide not to, we do not plan on continuing with this regimen because of the above reasons. Recommendations:   None at this time.      Recent Visits Date Type Provider Dept  11/02/22 Office Visit Milinda Pointer, MD [facility]  10/19/22 Procedure visit Milinda Pointer, MD [facility]  10/02/22 Office Visit Milinda Pointer, MD [facility]  09/04/22 Office Visit Milinda Pointer, MD [facility]  Showing recent visits within past 90 days and meeting all other requirements Future Appointments Date Type Provider Dept  11/14/22 Appointment Milinda Pointer, MD [facility]  Showing future appointments within next 90 days and meeting all  other requirements  Disposition: Discharge home  Discharge (Date  Time):  11/14/2022;   hrs.   Primary Care Physician: Rusty Aus, MD Location: [facility] Note by: Gaspar Cola, MD Date: 11/14/2022; Time: 11:19 AM  Disclaimer:  Medicine is not an Chief Strategy Officer. The only guarantee in medicine is that nothing is guaranteed. It is important to note that the decision to proceed with this intervention was based on the information collected from the patient. The Data and conclusions were drawn from the patient's questionnaire, the interview, and the physical examination. Because the information was provided in large part by the patient, it cannot be guaranteed that it has not been purposely or unconsciously manipulated. Every effort has been made to obtain as much relevant data as possible for this evaluation. It is important to note that the conclusions that lead to this procedure are derived in large part from the available data. Always take into account that the treatment will also be dependent on availability of resources and existing treatment guidelines, considered by other Pain Management Practitioners as being common knowledge and practice, at the time of the intervention. For Medico-Legal purposes, it is also important to point out that variation in procedural techniques and pharmacological choices are the acceptable norm. The indications, contraindications, technique, and results of the above procedure should only be interpreted and judged by a Board-Certified Interventional Pain Specialist with extensive familiarity and expertise in the same exact procedure and technique.

## 2022-11-14 ENCOUNTER — Ambulatory Visit: Payer: No Typology Code available for payment source | Attending: Pain Medicine | Admitting: Pain Medicine

## 2022-11-14 DIAGNOSIS — Z91199 Patient's noncompliance with other medical treatment and regimen due to unspecified reason: Secondary | ICD-10-CM

## 2022-11-19 NOTE — Progress Notes (Unsigned)
PROVIDER NOTE: Interpretation of information contained herein should be left to medically-trained personnel. Specific patient instructions are provided elsewhere under "Patient Instructions" section of medical record. This document was created in part using STT-dictation technology, any transcriptional errors that may result from this process are unintentional.  Patient: Johnathan Harvey Type: Established DOB: [birth year] MRN: [id number] PCP: Rusty Aus, MD   Service: Procedure DOS: 11/23/2022 Setting: Ambulatory Location: Ambulatory outpatient facility Delivery: Face-to-face Provider: Gaspar Cola, MD Specialty: Interventional Pain Management Specialty designation: 09 Location: Outpatient facility Ref. Prov.: Rusty Aus, MD   Interventional Therapy     Type: Cervical Facet, Medial Branch Radiofrequency Ablation (RFA)  #1  Laterality: Right (-RT)  Level: C3, C4, C5, C6, and C7 Medial Branch Level(s). Lesioning of these levels should completely denervate the C3-4, C4-5, C5-6, and C6-7 cervical facet joints.  Imaging: Fluoroscopy-guided         Anesthesia: Local anesthesia (1-2% Lidocaine) Anxiolysis: IV Versed         Sedation: Moderate Sedation                       DOS: 11/23/2022  Performed by: Gaspar Cola, MD  Purpose: Therapeutic/Palliative Indications: Chronic neck pain (cervicalgia) severe enough to impact quality of life or function. Rationale (medical necessity): procedure needed and proper for the diagnosis and/or treatment of Johnathan Harvey's medical symptoms and needs. Indications: 1. Cervical facet syndrome   2. Cervicalgia (1ry area of Pain)   3. Spondylosis without myelopathy or radiculopathy, cervical region   4. Cervical facet hypertrophy (Multilevel) (Bilateral)   5. 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 Cervical facet arthropathy (Multilevel) (Bilateral)   7. DDD (degenerative disc disease), cervical   8. Cervicogenic  headache (2ry area of Pain) (Right)    Johnathan Harvey has been dealing with the above chronic pain for longer than three months and has either failed to respond, was unable to tolerate, or simply did not get enough benefit from other more conservative therapies including, but not limited to: 1. Over-the-counter medications 2. Anti-inflammatory medications 3. Muscle relaxants 4. Membrane stabilizers 5. Opioids 6. Physical therapy and/or chiropractic manipulation 7. Modalities (Heat, ice, etc.) 8. Invasive techniques such as nerve blocks. Johnathan Harvey has attained more than 50% relief of the pain from a series of diagnostic injections conducted in separate occasions.  Pain Score: Pre-procedure: 0-No pain/10 Post-procedure: 0-No pain/10    Target: Cervical medial nerve  Location: Posterolateral aspect of the waist of the cervical articular pillar. Region: Cervical  Approach: Paramedial  Type of procedure: Radiofrequency Neurolytic Ablation   Position / Prep / Materials:  Position: Prone with head of the table was raised to facilitate breathing.  Prep solution: DuraPrep (Iodine Povacrylex [0.7% available iodine] and Isopropyl Alcohol, 74% w/w) Prep Area: Entire Posterior Cervical Region  Materials:  Tray:  RFA (Radiofrequency) tray Needle(s):  Type: RFA (Teflon-coated radiofrequency ablation needles)  Gauge (G): 22  Length: Short (5cm)  Qty: 5     Pre-op H&P Assessment:  Johnathan Harvey is a 67 y.o. (year old), male patient, seen today for interventional treatment. He  has a past surgical history that includes Ankle arthroplasty; Esophagogastroduodenoscopy (N/A, 11/04/2018); and Inguinal hernia repair (Left, 09/26/2019). Johnathan Harvey has a current medication list which includes the following prescription(s): hydrocodone-acetaminophen, [START ON 11/30/2022] hydrocodone-acetaminophen, amlodipine, cyclobenzaprine, gabapentin, ondansetron, percocet, and PRESCRIPTION MEDICATION, and the  following Facility-Administered Medications: fentanyl. His primarily concern today is the Neck  Pain  Initial Vital Signs:  Pulse/HCG Rate: 86ECG Heart Rate: 79 (NSR) Temp: (!) 97.5 F (36.4 C) Resp: 16 BP: (!) 142/88 SpO2: 98 %  BMI: Estimated body mass index is 20.09 kg/m as calculated from the following:   Height as of this encounter: '5\' 10"'$  (1.778 m).   Weight as of this encounter: 140 lb (63.5 kg).  Risk Assessment: Allergies: Reviewed. He is allergic to protonix [pantoprazole].  Allergy Precautions: None required Coagulopathies: Reviewed. None identified.  Blood-thinner therapy: None at this time Active Infection(s): Reviewed. None identified. Johnathan Harvey is afebrile  Site Confirmation: Johnathan Harvey was asked to confirm the procedure and laterality before marking the site Procedure checklist: Completed Consent: Before the procedure and under the influence of no sedative(s), amnesic(s), or anxiolytics, the patient was informed of the treatment options, risks and possible complications. To fulfill our ethical and legal obligations, as recommended by the American Medical Association's Code of Ethics, I have informed the patient of my clinical impression; the nature and purpose of the treatment or procedure; the risks, benefits, and possible complications of the intervention; the alternatives, including doing nothing; the risk(s) and benefit(s) of the alternative treatment(s) or procedure(s); and the risk(s) and benefit(s) of doing nothing. The patient was provided information about the general risks and possible complications associated with the procedure. These may include, but are not limited to: failure to achieve desired goals, infection, bleeding, organ or nerve damage, allergic reactions, paralysis, and death. In addition, the patient was informed of those risks and complications associated to Spine-related procedures, such as failure to decrease pain; infection (i.e.:  Meningitis, epidural or intraspinal abscess); bleeding (i.e.: epidural hematoma, subarachnoid hemorrhage, or any other type of intraspinal or peri-dural bleeding); organ or nerve damage (i.e.: Any type of peripheral nerve, nerve root, or spinal cord injury) with subsequent damage to sensory, motor, and/or autonomic systems, resulting in permanent pain, numbness, and/or weakness of one or several areas of the body; allergic reactions; (i.e.: anaphylactic reaction); and/or death. Furthermore, the patient was informed of those risks and complications associated with the medications. These include, but are not limited to: allergic reactions (i.e.: anaphylactic or anaphylactoid reaction(s)); adrenal axis suppression; blood sugar elevation that in diabetics may result in ketoacidosis or comma; water retention that in patients with history of congestive heart failure may result in shortness of breath, pulmonary edema, and decompensation with resultant heart failure; weight gain; swelling or edema; medication-induced neural toxicity; particulate matter embolism and blood vessel occlusion with resultant organ, and/or nervous system infarction; and/or aseptic necrosis of one or more joints. Finally, the patient was informed that Medicine is not an exact science; therefore, there is also the possibility of unforeseen or unpredictable risks and/or possible complications that may result in a catastrophic outcome. The patient indicated having understood very clearly. We have given the patient no guarantees and we have made no promises. Enough time was given to the patient to ask questions, all of which were answered to the patient's satisfaction. Johnathan Harvey has indicated that he wanted to continue with the procedure. Attestation: I, the ordering provider, attest that I have discussed with the patient the benefits, risks, side-effects, alternatives, likelihood of achieving goals, and potential problems during recovery for  the procedure that I have provided informed consent. Date  Time: 11/23/2022  8:25 AM   Pre-Procedure Preparation:  Monitoring: As per clinic protocol. Respiration, ETCO2, SpO2, BP, heart rate and rhythm monitor placed and checked for adequate function Safety Precautions: Patient was assessed for  positional comfort and pressure points before starting the procedure. Time-out: I initiated and conducted the "Time-out" before starting the procedure, as per protocol. The patient was asked to participate by confirming the accuracy of the "Time Out" information. Verification of the correct person, site, and procedure were performed and confirmed by me, the nursing staff, and the patient. "Time-out" conducted as per Joint Commission's Universal Protocol (UP.01.01.01). Time: [phone]  Description/Narrative of Procedure:          Rationale (medical necessity): procedure needed and proper for the diagnosis and/or treatment of the patient's medical symptoms and needs. Procedural Technique Safety Precautions: Aspiration looking for blood return was conducted prior to all injections. At no point did we inject any substances, as a needle was being advanced. No attempts were made at seeking any paresthesias. Safe injection practices and needle disposal techniques used. Medications properly checked for expiration dates. SDV (single dose vial) medications used. Description of the Procedure: Protocol guidelines were followed. The patient was assisted into a comfortable position. The target area was identified and the area prepped in the usual manner. Skin & deeper tissues infiltrated with local anesthetic. Appropriate amount of time allowed to pass for local anesthetics to take effect. The procedure needles were then advanced to the target area. Proper needle placement secured. Negative aspiration confirmed. Solution injected in intermittent fashion, asking for systemic symptoms every 0.5cc of injectate. The needles were then  removed and the area cleansed, making sure to leave some of the prepping solution back to take advantage of its long term bactericidal properties.  Technical description of procedure:  Laterality: Right Radiofrequency Ablation (RFA) C3 Medial Branch Nerve RFA: The target area for the C3 dorsal medial articular branch is the lateral concave waist of the articular pillar of C3. Under fluoroscopic guidance, a Radiofrequency needle was inserted until contact was made with os over the postero-lateral aspect of the articular pillar of C3 (target area). Sensory and motor testing was conducted to properly adjust the position of the needle. Once satisfactory placement of the needle was achieved, the numbing solution was slowly injected after negative aspiration for blood. 2.0 mL of the nerve block solution was injected without difficulty or complication. After waiting for at least 3 minutes, the ablation was performed. Once completed, the needle was removed intact. C4 Medial Branch Nerve RFA: The target area for the C4 dorsal medial articular branch is the lateral concave waist of the articular pillar of C4. Under fluoroscopic guidance, a Radiofrequency needle was inserted until contact was made with os over the postero-lateral aspect of the articular pillar of C4 (target area). Sensory and motor testing was conducted to properly adjust the position of the needle. Once satisfactory placement of the needle was achieved, the numbing solution was slowly injected after negative aspiration for blood. 2.0 mL of the nerve block solution was injected without difficulty or complication. After waiting for at least 3 minutes, the ablation was performed. Once completed, the needle was removed intact. C5 Medial Branch Nerve RFA: The target area for the C5 dorsal medial articular branch is the lateral concave waist of the articular pillar of C5. Under fluoroscopic guidance, a Radiofrequency needle was inserted until contact was  made with os over the postero-lateral aspect of the articular pillar of C5 (target area). Sensory and motor testing was conducted to properly adjust the position of the needle. Once satisfactory placement of the needle was achieved, the numbing solution was slowly injected after negative aspiration for blood. 2.0 mL of the  nerve block solution was injected without difficulty or complication. After waiting for at least 3 minutes, the ablation was performed. Once completed, the needle was removed intact. C6 Medial Branch Nerve RFA: The target area for the C6 dorsal medial articular branch is the lateral concave waist of the articular pillar of C6. Under fluoroscopic guidance, a Radiofrequency needle was inserted until contact was made with os over the postero-lateral aspect of the articular pillar of C6 (target area). Sensory and motor testing was conducted to properly adjust the position of the needle. Once satisfactory placement of the needle was achieved, the numbing solution was slowly injected after negative aspiration for blood. 2.0 mL of the nerve block solution was injected without difficulty or complication. After waiting for at least 3 minutes, the ablation was performed. Once completed, the needle was removed intact. C7 Medial Branch Nerve RFA: The target for the C7 dorsal medial articular branch lies on the superior-medial tip of the C7 transverse process. Under fluoroscopic guidance, a Radiofrequency needle was inserted until contact was made with os over the postero-lateral aspect of the articular pillar of C7 (target area). Sensory and motor testing was conducted to properly adjust the position of the needle. Once satisfactory placement of the needle was achieved, the numbing solution was slowly injected after negative aspiration for blood. 2.0 mL of the nerve block solution was injected without difficulty or complication. After waiting for at least 3 minutes, the ablation was performed. Once  completed, the needle was removed intact. Radiofrequency lesioning (ablation):  Radiofrequency Generator: Medtronic AccurianTM AG 1000 RF Generator Sensory Stimulation Parameters: 50 Hz was used to locate & identify the nerve, making sure that the needle was positioned such that there was no sensory stimulation below 0.3 V or above 0.7 V. Motor Stimulation Parameters: 2 Hz was used to evaluate the motor component. Care was taken not to lesion any nerves that demonstrated motor stimulation of the lower extremities at an output of less than 2.5 times that of the sensory threshold, or a maximum of 2.0 V. Lesioning Technique Parameters: Standard Radiofrequency settings. (Not bipolar or pulsed.) Temperature Settings: 80 degrees C Lesioning time: 60 seconds Intra-operative Compliance: Compliant   Once the entire procedure was completed, the treated area was cleaned, making sure to leave some of the prepping solution back to take advantage of its long term bactericidal properties. Intra-operative Compliance: Compliant  Anatomy Reference Guide:           Vitals:   11/23/22 1025 11/23/22 1033 11/23/22 1043 11/23/22 1053  BP: (!) 137/100 (!) 144/93 129/86 136/84  Pulse:      Resp: '15 11 20 20  '$ Temp:  (!) 97.2 F (36.2 C)    TempSrc:  Temporal    SpO2: 100% 95% 96% 99%  Weight:      Height:         Start Time: 0943 hrs. End Time: 1025 hrs.  Imaging Guidance (Spinal):          Type of Imaging Technique: Fluoroscopy Guidance (Spinal) Indication(s): Assistance in needle guidance and placement for procedures requiring needle placement in or near specific anatomical locations not easily accessible without such assistance. Exposure Time: Please see nurses notes. Contrast: None used. Fluoroscopic Guidance: I was personally present during the use of fluoroscopy. "Tunnel Vision Technique" used to obtain the best possible view of the target area. Parallax error corrected before commencing the  procedure. "Direction-depth-direction" technique used to introduce the needle under continuous pulsed fluoroscopy. Once target was reached,  antero-posterior, oblique, and lateral fluoroscopic projection used confirm needle placement in all planes. Images permanently stored in EMR. Interpretation: No contrast injected. I personally interpreted the imaging intraoperatively. Adequate needle placement confirmed in multiple planes. Permanent images saved into the patient's record.  Post-operative Assessment:  Post-procedure Vital Signs:  Pulse/HCG Rate: 8683 Temp: (!) 97.2 F (36.2 C) Resp: 20 BP: 136/84 SpO2: 99 %  EBL: None  Complications: No immediate post-treatment complications observed by team, or reported by patient.  Note: The patient tolerated the entire procedure well. A repeat set of vitals were taken after the procedure and the patient was kept under observation following institutional policy, for this type of procedure. Post-procedural neurological assessment was performed, showing return to baseline, prior to discharge. The patient was provided with post-procedure discharge instructions, including a section on how to identify potential problems. Should any problems arise concerning this procedure, the patient was given instructions to immediately contact us, at any time, without hesitation. In any case, we plan to contact the patient by telephone for a follow-up status report regarding this interventional procedure.  Comments:  No additional relevant information.  Plan of Care  Orders:  Orders Placed This Encounter  Procedures   Radiofrequency,Cervical    Scheduling Instructions:     Side(s): Right-sided     Level(s): C3, C4, C5, C6, and C7 Medial Branch Nerves     Sedation: With Sedation.     Timeframe: Today    Order Specific Question:   Where will this procedure be performed?    Answer:   [facility]   DG [facility] C-ARM 1-60 MIN NO REPORT    Intraoperative  interpretation by procedural physician at [place].    Standing Status:   Standing    Number of Occurrences:   1    Order Specific Question:   Reason for exam:    Answer:   Assistance in needle guidance and placement for procedures requiring needle placement in or near specific anatomical locations not easily accessible without such assistance.   Informed Consent Details: Physician/Practitioner Attestation; Transcribe to consent form and obtain patient signature    Nursing Order: Transcribe to consent form and obtain patient signature. Note: Always confirm laterality of pain with Johnathan Harvey, before procedure.    Order Specific Question:   Physician/Practitioner attestation of informed consent for procedure/surgical case    Answer:   I, the physician/practitioner, attest that I have discussed with the patient the benefits, risks, side effects, alternatives, likelihood of achieving goals and potential problems during recovery for the procedure that I have provided informed consent.    Order Specific Question:   Procedure    Answer:   Cervical Facet Radiofrequency Ablation    Order Specific Question:   Physician/Practitioner performing the procedure    Answer:   Judy Pollman A. Dossie Arbour, MD    Order Specific Question:   Indication/Reason    Answer:   Neck Pain (Cervicalgia), with our without referred arm pain, due to Facet Joint Arthralgia (Joint Pain) known as Cervical Facet Syndrome, secondary to Cervical, and/or Cervico-thoracic Spondylosis (Arthritis of the Spine), without myelopathy or radiculop   Provide equipment / supplies at bedside    Procedure tray: "Radiofrequency Tray" Additional material: Large hemostat (x1); Small hemostat (x1); Towels (x8); 4x4 sterile sponge pack (x1) Needle type: Teflon-coated Radiofrequency Needle (Disposable  single use) Size: Short Quantity: 5    Standing Status:   Standing    Number of Occurrences:   1  Order Specific Question:   Specify     Answer:   Radiofrequency Tray   Chronic Opioid Analgesic:  No chronic opioid analgesics therapy prescribed by our practice.  Oxycodone/APAP 5/325 (# 20) 1 4 times daily (last filled on 08/31/2022) prescribed by Melinda Crutch, MD  MME/day: 30 mg/day   Medications ordered for procedure: Meds ordered this encounter  Medications   lidocaine (XYLOCAINE) 2 % (with pres) injection 400 mg   pentafluoroprop-tetrafluoroeth (GEBAUERS) aerosol   lactated ringers infusion   midazolam (VERSED) 5 MG/5ML injection 0.5-2 mg    Make sure Flumazenil is available in the pyxis when using this medication. If oversedation occurs, administer 0.2 mg IV over 15 sec. If after 45 sec no response, administer 0.2 mg again over 1 min; may repeat at 1 min intervals; not to exceed 4 doses (1 mg)   fentaNYL (SUBLIMAZE) injection 25-50 mcg    Make sure Narcan is available in the pyxis when using this medication. In the event of respiratory depression (RR< 8/min): Titrate NARCAN (naloxone) in increments of 0.1 to 0.2 mg IV at 2-3 minute intervals, until desired degree of reversal.   ropivacaine (PF) 2 mg/mL (0.2%) (NAROPIN) injection 9 mL   dexamethasone (DECADRON) injection 10 mg   HYDROcodone-acetaminophen (NORCO/VICODIN) 5-325 MG tablet    Sig: Take 1 tablet by mouth every 6 (six) hours as needed for up to 7 days for severe pain. Must last 7 days.    Dispense:  28 tablet    Refill:  0    For acute post-operative pain. Not to be refilled. Must last 7 days.   HYDROcodone-acetaminophen (NORCO/VICODIN) 5-325 MG tablet    Sig: Take 1 tablet by mouth every 6 (six) hours as needed for up to 7 days for severe pain. Must last 7 days.    Dispense:  28 tablet    Refill:  0    For acute post-operative pain. Not to be refilled.  Must last 7 days.   Medications administered: We administered lidocaine, pentafluoroprop-tetrafluoroeth, lactated ringers, midazolam, fentaNYL, ropivacaine (PF) 2 mg/mL (0.2%), and dexamethasone.   See the medical record for exact dosing, route, and time of administration.  Follow-up plan:   Return in about 2 weeks (around 12/07/2022) for (68mn), (ECT): (L) C-FCT (C6,C7,C8) RFA #1.       Interventional Therapies  Risk  Complexity Considerations:   Note: Difficult IV stick.   Planned  Pending:   Therapeutic right C3-C8 cervical MB RFA #1    Under consideration:   Diagnostic right cervical facet C3-C7 RFA #1 & (B) C7 & T1 RFA #1    Completed:   Diagnostic bilateral cervical facet MBB x4 (10/19/2022) (100/100/35/35)  Diagnostic bilateral cervical facet C6-T1 MBB x1 (05/25/2022) (100/100/85/85)  Diagnostic right cervical facet C3-C5 MBB x1 (05/25/2022) (100/100/85/85)  Diagnostic right cervical facet C3-C7 MBB x1 (04/25/2022) (100/100/60/100) (100-Neck/60-H/A)  Diagnostic bilateral cervical facet C7 & T1 MBB x1 (04/25/2022) (100/100/60/100) (100-Neck/60-H/A)  Diagnostic right Cervical ESI x1 (03/23/2022) (DOS: 10-10/10) (0/0/0/0) (patient indicated having gone to the ED x4 since procedure.)   Completed by other providers:   Therapeutic right C3 and C4 medial branch RFA x1 (07/30/2020) [facility]  Diagnostic right C3 and C4 MBB x2 (06/30/2020, 07/15/2020) [facility]  Diagnostic right C3-4 zygapophyseal joint (facet joint) inj. x1 (03/05/2020) by BSharlet Salina DO Therapeutic  ACDF C4/5, C5/6, C6/7 (07/04/2022) by [place]uffy [place]ody [facility] ([facility] (80% improvement)    Therapeutic  Palliative (  PRN) options:   None established   Pharmacotherapy:  Nonopioid transferred (10/02/2022): Gabapentin (Neurontin) 300 mg, 3 tabs p.o. at bedtime; Flexeril 10 mg, 1 tab p.o. at bedtime. I believe this regimen to be appropriate for this patient however, we no longer have the manpower to continue managing medications on these patients and therefore I have kindly request that for his PCP to consider taking over it.  The patient was informed that should they decide  not to, we do not plan on continuing with this regimen because of the above reasons. Recommendations:   None at this time.      Recent Visits Date Type Provider Dept  11/02/22 Office Visit Milinda Pointer, MD [facility]  10/19/22 Procedure visit Milinda Pointer, MD [facility]  10/02/22 Office Visit Milinda Pointer, MD [facility]  09/04/22 Office Visit Milinda Pointer, MD [facility]  Showing recent visits within past 90 days and meeting all other requirements Today's Visits Date Type Provider Dept  11/23/22 Procedure visit Milinda Pointer, MD [facility]  Showing today's visits and meeting all other requirements Future Appointments No visits were found meeting these conditions. Showing future appointments within next 90 days and meeting all other requirements  Disposition: Discharge home  Discharge (Date  Time): 11/23/2022;   hrs.   Primary Care Physician: Rusty Aus, MD Location: [facility] Note by: Gaspar Cola, MD Date: 11/23/2022; Time: 12:20 PM  Disclaimer:  Medicine is not an Chief Strategy Officer. The only guarantee in medicine is that nothing is guaranteed. It is important to note that the decision to proceed with this intervention was based on the information collected from the patient. The Data and conclusions were drawn from the patient's questionnaire, the interview, and the physical examination. Because the information was provided in large part by the patient, it cannot be guaranteed that it has not been purposely or unconsciously manipulated. Every effort has been made to obtain as much relevant data as possible for this evaluation. It is important to note that the conclusions that lead to this procedure are derived in large part from the available data. Always take into account that the treatment will also be dependent on availability of resources and existing treatment  guidelines, considered by other Pain Management Practitioners as being common knowledge and practice, at the time of the intervention. For Medico-Legal purposes, it is also important to point out that variation in procedural techniques and pharmacological choices are the acceptable norm. The indications, contraindications, technique, and results of the above procedure should only be interpreted and judged by a Board-Certified Interventional Pain Specialist with extensive familiarity and expertise in the same exact procedure and technique.

## 2022-11-23 ENCOUNTER — Ambulatory Visit
Admission: RE | Admit: 2022-11-23 | Discharge: 2022-11-23 | Disposition: A | Discharge status: Home / Self Care | Payer: No Typology Code available for payment source | Source: Ambulatory Visit | Attending: Pain Medicine | Admitting: Pain Medicine

## 2022-11-23 ENCOUNTER — Ambulatory Visit: Payer: No Typology Code available for payment source | Attending: Pain Medicine | Admitting: Pain Medicine

## 2022-11-23 ENCOUNTER — Encounter: Payer: Self-pay | Admitting: Pain Medicine

## 2022-11-23 VITALS — BP 136/84 | HR 86 | Temp 97.2°F | Resp 20 | Ht 70.0 in | Wt 140.0 lb

## 2022-11-23 DIAGNOSIS — M47812 Spondylosis without myelopathy or radiculopathy, cervical region: Secondary | ICD-10-CM | POA: Diagnosis not present

## 2022-11-23 DIAGNOSIS — M4312 Spondylolisthesis, cervical region: Secondary | ICD-10-CM | POA: Insufficient documentation

## 2022-11-23 DIAGNOSIS — M5030 Other cervical disc degeneration, unspecified cervical region: Secondary | ICD-10-CM | POA: Insufficient documentation

## 2022-11-23 DIAGNOSIS — G4486 Cervicogenic headache: Secondary | ICD-10-CM | POA: Insufficient documentation

## 2022-11-23 DIAGNOSIS — M542 Cervicalgia: Secondary | ICD-10-CM | POA: Diagnosis present

## 2022-11-23 DIAGNOSIS — G8918 Other acute postprocedural pain: Secondary | ICD-10-CM | POA: Diagnosis present

## 2022-11-23 DIAGNOSIS — R937 Abnormal findings on diagnostic imaging of other parts of musculoskeletal system: Secondary | ICD-10-CM | POA: Diagnosis present

## 2022-11-23 MED ORDER — MIDAZOLAM HCL 5 MG/5ML IJ SOLN
INTRAMUSCULAR | Status: AC
  Filled 2022-11-23: qty 5

## 2022-11-23 MED ORDER — HYDROCODONE-ACETAMINOPHEN 5-325 MG PO TABS: 1.0000 | ORAL_TABLET | Freq: Four times a day (QID) | ORAL | 0 refills | Status: DC | PRN

## 2022-11-23 MED ORDER — DEXAMETHASONE SODIUM PHOSPHATE 10 MG/ML IJ SOLN
INTRAMUSCULAR | Status: AC
  Filled 2022-11-23: qty 1

## 2022-11-23 MED ORDER — LACTATED RINGERS IV SOLN: Freq: Once | INTRAVENOUS | Status: AC

## 2022-11-23 MED ORDER — ROPIVACAINE HCL 2 MG/ML IJ SOLN
9.0000 mL | Freq: Once | INTRAMUSCULAR | Status: AC
  Administered 2022-11-23: 9 mL via PERINEURAL

## 2022-11-23 MED ORDER — MIDAZOLAM HCL 5 MG/5ML IJ SOLN
0.5000 mg | Freq: Once | INTRAMUSCULAR | Status: AC
  Administered 2022-11-23: 3 mg via INTRAVENOUS

## 2022-11-23 MED ORDER — DEXAMETHASONE SODIUM PHOSPHATE 10 MG/ML IJ SOLN
10.0000 mg | Freq: Once | INTRAMUSCULAR | Status: AC
  Administered 2022-11-23: 10 mg

## 2022-11-23 MED ORDER — PENTAFLUOROPROP-TETRAFLUOROETH EX AERO
INHALATION_SPRAY | Freq: Once | CUTANEOUS | Status: AC
  Administered 2022-11-23: 30 via TOPICAL
  Filled 2022-11-23: qty 116

## 2022-11-23 MED ORDER — ROPIVACAINE HCL 2 MG/ML IJ SOLN
INTRAMUSCULAR | Status: AC
  Filled 2022-11-23: qty 20

## 2022-11-23 MED ORDER — LIDOCAINE HCL 2 % IJ SOLN
INTRAMUSCULAR | Status: AC
  Filled 2022-11-23: qty 20

## 2022-11-23 MED ORDER — FENTANYL CITRATE (PF) 100 MCG/2ML IJ SOLN
25.0000 ug | INTRAMUSCULAR | Status: DC | PRN
  Administered 2022-11-23: 50 ug via INTRAVENOUS

## 2022-11-23 MED ORDER — LIDOCAINE HCL 2 % IJ SOLN
20.0000 mL | Freq: Once | INTRAMUSCULAR | Status: AC
  Administered 2022-11-23: 400 mg

## 2022-11-23 MED ORDER — FENTANYL CITRATE (PF) 100 MCG/2ML IJ SOLN
INTRAMUSCULAR | Status: AC
  Filled 2022-11-23: qty 2

## 2022-11-23 NOTE — Progress Notes (Signed)
Safety precautions to be maintained throughout the outpatient stay will include: orient to surroundings, keep bed in low position, maintain call bell within reach at all times, provide assistance with transfer out of bed and ambulation.  

## 2022-11-23 NOTE — Patient Instructions (Signed)
___________________________________________________________________________________________  Post-Radiofrequency (RF) Discharge Instructions  You have just completed a Radiofrequency Neurotomy.  The following instructions will provide you with information and guidelines for self-care upon discharge.  If at any time you have questions or concerns please call your physician. DO NOT DRIVE YOURSELF!!  Instructions:  Apply ice: Fill a plastic sandwich bag with crushed ice. Cover it with a small towel and apply to injection site. Apply for 15 minutes then remove x 15 minutes. Repeat sequence on day of procedure, until you go to bed. The purpose is to minimize swelling and discomfort after procedure.  Apply heat: Apply heat to procedure site starting the day following the procedure. The purpose is to treat any soreness and discomfort from the procedure.  Food intake: No eating limitations, unless stipulated above.  Nevertheless, if you have had sedation, you may experience some nausea.  In this case, it may be wise to wait at least two hours prior to resuming regular diet.  Physical activities: Keep activities to a minimum for the first 8 hours after the procedure. For the first 24 hours after the procedure, do not drive a motor vehicle,  Operate heavy machinery, power tools, or handle any weapons.  Consider walking with the use of an assistive device or accompanied by an adult for the first 24 hours.  Do not drink alcoholic beverages including beer.  Do not make any important decisions or sign any legal documents. Go home and rest today.  Resume activities tomorrow, as tolerated.  Use caution in moving about as you may experience mild leg weakness.  Use caution in cooking, use of household electrical appliances and climbing steps.  Driving: If you have received any sedation, you are not allowed to drive for 24 hours after your procedure.  Blood thinner: Restart your blood thinner 6 hours after your  procedure. (Only for those taking blood thinners)  Insulin: As soon as you can eat, you may resume your normal dosing schedule. (Only for those taking insulin)  Medications: May resume pre-procedure medications.  Do not take any drugs, other than what has been prescribed to you.  Infection prevention: Keep procedure site clean and dry.  Post-procedure Pain Diary: Extremely important that this be done correctly and accurately. Recorded information will be used to determine the next step in treatment.  Pain evaluated is that of treated area only. Do not include pain from an untreated area.  Complete every hour, on the hour, for the initial 8 hours. Set an alarm to help you do this part accurately.  Do not go to sleep and have it completed later. It will not be accurate.  Follow-up appointment: Keep your follow-up appointment after the procedure. Usually 2-6 weeks after radiofrequency. Bring you pain diary. The information collected will be essential for your long-term care.   Expect:  From numbing medicine (AKA: Local Anesthetics): Numbness or decrease in pain.  Onset: Full effect within 15 minutes of injected.  Duration: It will depend on the type of local anesthetic used. On the average, 1 to 8 hours.   From steroids (when added): Decrease in swelling or inflammation. Once inflammation is improved, relief of the pain will follow.  Onset of benefits: Depends on the amount of swelling present. The more swelling, the longer it will take for the benefits to be seen. In some cases, up to 10 days.  Duration: Steroids will stay in the system x 2 weeks. Duration of benefits will depend on multiple posibilities including persistent irritating factors.    this may last as long as 6 weeks. Additional post-procedure pain medication is provided for this. Discomfort is minimized if ice and heat are applied as  instructed.  Call if: You experience numbness and weakness that gets worse with time, as opposed to wearing off. He experience any unusual bleeding, difficulty breathing, or loss of the ability to control your bowel and bladder. (This applies to Spinal procedures only) You experience any redness, swelling, heat, red streaks, elevated temperature, fever, or any other signs of a possible infection.  Emergency Numbers: Durning business hours (Monday - Thursday, 8:00 AM - 4:00 PM) (Friday, 9:00 AM - 12:00 Noon): [phone] After hours: [phone] ____________________________________________________________________________________________    ______________________________________________________________________  Procedure instructions  Do not eat or drink fluids (other than water) for 6 hours before your procedure  No water for 2 hours before your procedure  Take your blood pressure medicine with a sip of water  Arrive 30 minutes before your appointment  Carefully read the "Preparing for your procedure" detailed instructions  If you have questions call us at [phone]  _____________________________________________________________________    ______________________________________________________________________  Preparing for your procedure  During your procedure appointment there will be: No Prescription Refills. No disability issues to discussed. No medication changes or discussions.  Instructions: Food intake: Avoid eating anything solid for at least 8 hours prior to your procedure. Clear liquid intake: You may take clear liquids such as water up to 2 hours prior to your procedure. (No carbonated drinks. No soda.) Transportation: Unless otherwise stated by your physician, bring a driver. Morning Medicines: Except for blood thinners, take all of your other morning medications with a sip of water. Make sure to take your heart and blood pressure medicines. If your  blood pressure's lower number is above 100, the case will be rescheduled. Blood thinners: Make sure to stop your blood thinners as instructed.  If you take a blood thinner, but were not instructed to stop it, call our office [phone] and ask to talk to a nurse. Not stopping a blood thinner prior to certain procedures could lead to serious complications. Diabetics on insulin: Notify the staff so that you can be scheduled 1st case in the morning. If your diabetes requires high dose insulin, take only  of your normal insulin dose the morning of the procedure and notify the staff that you have done so. Preventing infections: Shower with an antibacterial soap the morning of your procedure.  Build-up your immune system: Take 1000 mg of Vitamin C with every meal (3 times a day) the day prior to your procedure. Antibiotics: Inform the nursing staff if you are taking any antibiotics or if you have any conditions that may require antibiotics prior to procedures. (Example: recent joint implants)   Pregnancy: If you are pregnant make sure to notify the nursing staff. Not doing so may result in injury to the fetus, including death.  Sickness: If you have a cold, fever, or any active infections, call and cancel or reschedule your procedure. Receiving steroids while having an infection may result in complications. Arrival: You must be in the facility at least 30 minutes prior to your scheduled procedure. Tardiness: Your scheduled time is also the cutoff time. If you do not arrive at least 15 minutes prior to your procedure, you will be rescheduled.  Children: Do not bring any children with you. Make arrangements to keep them home. Dress appropriately: There is always a possibility that your clothing may get soiled. Avoid long   dresses. Valuables: Do not bring any jewelry or valuables.  Reasons to call and reschedule or cancel your procedure: (Following these recommendations will minimize the risk of a serious  complication.) Surgeries: Avoid having procedures within 2 weeks of any surgery. (Avoid for 2 weeks before or after any surgery). Flu Shots: Avoid having procedures within 2 weeks of a flu shots or . (Avoid for 2 weeks before or after immunizations). Barium: Avoid having a procedure within 7-10 days after having had a radiological study involving the use of radiological contrast. (Myelograms, Barium swallow or enema study). Heart attacks: Avoid any elective procedures or surgeries for the initial 6 months after a "Myocardial Infarction" (Heart Attack). Blood thinners: It is imperative that you stop these medications before procedures. Let us know if you if you take any blood thinner.  Infection: Avoid procedures during or within two weeks of an infection (including chest colds or gastrointestinal problems). Symptoms associated with infections include: Localized redness, fever, chills, night sweats or profuse sweating, burning sensation when voiding, cough, congestion, stuffiness, runny nose, sore throat, diarrhea, nausea, vomiting, cold or Flu symptoms, recent or current infections. It is specially important if the infection is over the area that we intend to treat. Heart and lung problems: Symptoms that may suggest an active cardiopulmonary problem include: cough, chest pain, breathing difficulties or shortness of breath, dizziness, ankle swelling, uncontrolled high or unusually low blood pressure, and/or palpitations. If you are experiencing any of these symptoms, cancel your procedure and contact your primary care physician for an evaluation.  Remember:  Regular Business hours are:  Monday to Thursday 8:00 AM to 4:00 PM  Provider's Schedule: Charlann Wayne, MD:  Procedure days: Tuesday and Thursday 7:30 AM to 4:00 PM  Bilal Lateef, MD:  Procedure days: Monday and Wednesday 7:30 AM to 4:00 PM  ______________________________________________________________________     ____________________________________________________________________________________________  General Risks and Possible Complications  Patient Responsibilities: It is important that you read this as it is part of your informed consent. It is our duty to inform you of the risks and possible complications associated with treatments offered to you. It is your responsibility as a patient to read this and to ask questions about anything that is not clear or that you believe was not covered in this document.  Patient's Rights: You have the right to refuse treatment. You also have the right to change your mind, even after initially having agreed to have the treatment done. However, under this last option, if you wait until the last second to change your mind, you may be charged for the materials used up to that point.  Introduction: Medicine is not an exact science. Everything in Medicine, including the lack of treatment(s), carries the potential for danger, harm, or loss (which is by definition: Risk). In Medicine, a complication is a secondary problem, condition, or disease that can aggravate an already existing one. All treatments carry the risk of possible complications. The fact that a side effects or complications occurs, does not imply that the treatment was conducted incorrectly. It must be clearly understood that these can happen even when everything is done following the highest safety standards.  No treatment: You can choose not to proceed with the proposed treatment alternative. The "PRO(s)" would include: avoiding the risk of complications associated with the therapy. The "CON(s)" would include: not getting any of the treatment benefits. These benefits fall under one of three categories: diagnostic; therapeutic; and/or palliative. Diagnostic benefits include: getting information which can ultimately lead   to improvement of the disease or symptom(s). Therapeutic benefits are those associated with  the successful treatment of the disease. Finally, palliative benefits are those related to the decrease of the primary symptoms, without necessarily curing the condition (example: decreasing the pain from a flare-up of a chronic condition, such as incurable terminal cancer).  General Risks and Complications: These are associated to most interventional treatments. They can occur alone, or in combination. They fall under one of the following six (6) categories: no benefit or worsening of symptoms; bleeding; infection; nerve damage; allergic reactions; and/or death. No benefits or worsening of symptoms: In Medicine there are no guarantees, only probabilities. No healthcare provider can ever guarantee that a medical treatment will work, they can only state the probability that it may. Furthermore, there is always the possibility that the condition may worsen, either directly, or indirectly, as a consequence of the treatment. Bleeding: This is more common if the patient is taking a blood thinner, either prescription or over the counter (example: Goody Powders, Fish oil, Aspirin, Garlic, etc.), or if suffering a condition associated with impaired coagulation (example: Hemophilia, cirrhosis of the liver, low platelet counts, etc.). However, even if you do not have one on these, it can still happen. If you have any of these conditions, or take one of these drugs, make sure to notify your treating physician. Infection: This is more common in patients with a compromised immune system, either due to disease (example: diabetes, cancer, human immunodeficiency virus [HIV], etc.), or due to medications or treatments (example: therapies used to treat cancer and rheumatological diseases). However, even if you do not have one on these, it can still happen. If you have any of these conditions, or take one of these drugs, make sure to notify your treating physician. Nerve Damage: This is more common when the treatment is an  invasive one, but it can also happen with the use of medications, such as those used in the treatment of cancer. The damage can occur to small secondary nerves, or to large primary ones, such as those in the spinal cord and brain. This damage may be temporary or permanent and it may lead to impairments that can range from temporary numbness to permanent paralysis and/or brain death. Allergic Reactions: Any time a substance or material comes in contact with our body, there is the possibility of an allergic reaction. These can range from a mild skin rash (contact dermatitis) to a severe systemic reaction (anaphylactic reaction), which can result in death. Death: In general, any medical intervention can result in death, most of the time due to an unforeseen complication. ____________________________________________________________________________________________    

## 2022-11-24 ENCOUNTER — Telehealth: Payer: Self-pay

## 2022-11-24 NOTE — Telephone Encounter (Signed)
Post procedure follow up..  Patient states he is doing good 

## 2022-11-29 ENCOUNTER — Telehealth: Payer: Self-pay | Admitting: Pain Medicine

## 2022-11-29 NOTE — Telephone Encounter (Signed)
Patient states he is having numbness and pain on the back of his head since his procedure. No a headache. Wants to speak with a nurse.

## 2022-11-30 DIAGNOSIS — Z79899 Other long term (current) drug therapy: Secondary | ICD-10-CM | POA: Diagnosis not present

## 2022-11-30 DIAGNOSIS — Z0000 Encounter for general adult medical examination without abnormal findings: Secondary | ICD-10-CM | POA: Diagnosis not present

## 2022-11-30 DIAGNOSIS — M5090 Cervical disc disorder, unspecified, unspecified cervical region: Secondary | ICD-10-CM | POA: Diagnosis not present

## 2022-11-30 DIAGNOSIS — R739 Hyperglycemia, unspecified: Secondary | ICD-10-CM | POA: Diagnosis not present

## 2022-11-30 DIAGNOSIS — Z125 Encounter for screening for malignant neoplasm of prostate: Secondary | ICD-10-CM | POA: Diagnosis not present

## 2022-11-30 NOTE — Telephone Encounter (Signed)
Patient informed that due to the nature of a RFA, these symptoms are not unexpected, and are no reason for concern.

## 2022-12-06 DIAGNOSIS — D235 Other benign neoplasm of skin of trunk: Secondary | ICD-10-CM | POA: Diagnosis not present

## 2022-12-06 DIAGNOSIS — C44519 Basal cell carcinoma of skin of other part of trunk: Secondary | ICD-10-CM | POA: Diagnosis not present

## 2022-12-28 ENCOUNTER — Other Ambulatory Visit: Payer: Self-pay | Admitting: Pain Medicine

## 2022-12-28 DIAGNOSIS — G8918 Other acute postprocedural pain: Secondary | ICD-10-CM

## 2022-12-28 DIAGNOSIS — Z9889 Other specified postprocedural states: Secondary | ICD-10-CM

## 2022-12-28 DIAGNOSIS — M542 Cervicalgia: Secondary | ICD-10-CM

## 2022-12-28 DIAGNOSIS — G4486 Cervicogenic headache: Secondary | ICD-10-CM

## 2023-01-01 ENCOUNTER — Telehealth: Payer: Self-pay | Admitting: Pain Medicine

## 2023-01-01 NOTE — Telephone Encounter (Signed)
Advised patient to schedule follow-up appointment. Patient agreed to do so.

## 2023-01-01 NOTE — Telephone Encounter (Signed)
Patient had injections in Jan and Feb in his neck. He is still having scalp pain and is having nausea since injections. Wants to speak with someone about this.

## 2023-01-01 NOTE — Patient Instructions (Signed)

## 2023-01-01 NOTE — Progress Notes (Unsigned)
PROVIDER NOTE: Information contained herein reflects review and annotations entered in association with encounter. Interpretation of such information and data should be left to medically-trained personnel. Information provided to patient can be located elsewhere in the medical record under "Patient Instructions". Document created using STT-dictation technology, any transcriptional errors that may result from process are unintentional.    Patient: Johnathan Harvey  Service Category: E/M  Provider: Gaspar Cola, MD  DOB: 02/08/1956  DOS: 01/03/2023  Referring Provider: Rusty Aus, MD  MRN: [id number]  Specialty: Interventional Pain Management  PCP: Rusty Aus, MD  Type: Established Patient  Setting: Ambulatory outpatient    Location: Office  Delivery: Face-to-face     HPI  Johnathan Harvey, a 67 y.o. year old male, is here today because of his No primary diagnosis found.. Johnathan Harvey's primary complain today is No chief complaint on file.  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); Cervical facet arthropathy (Multilevel) (Bilateral); DDD (degenerative disc disease), cervical; Cervical foraminal stenosis (Bilateral: C6-7) (Right: C4-5) (Left: C5-6); Cervicogenic headache (2ry area of Pain) (Right); Cervical facet syndrome; Spondylosis without myelopathy or radiculopathy, cervical region; Occipital headache (Right); Neck pain of over 3 months duration; Cervical neck pain with evidence of disc disease; Chronic neck and back pain; Musculoskeletal disorder involving upper trapezius muscle (Right); Cervical facet hypertrophy (Multilevel) (Bilateral); Postoperative  pain after spinal surgery; Neck pain with history of cervical spinal surgery; and Spinal stenosis in cervical region on their pertinent problem list. Pain Assessment: Severity of   is reported as a  /10. Location:    / . Onset:  . Quality:  . Timing:  . Modifying factor(s):  Marland Kitchen Vitals:  vitals were not taken for this visit.  BMI: Estimated body mass index is 20.09 kg/m as calculated from the following:   Height as of 11/23/22: 5\' 10"  (1.778 m).   Weight as of 11/23/22: 140 lb (63.5 kg). Last encounter: 11/02/2022. Last procedure: 11/23/2022.  Reason for encounter: evaluation of worsening, or previously known (established) problem. ***  Pharmacotherapy Assessment  Analgesic: No chronic opioid analgesics therapy prescribed by our practice.  Oxycodone/APAP 5/325 (# 20) 1 4 times daily (last filled on 08/31/2022) prescribed by Melinda Crutch, MD  MME/day: 30 mg/day   Monitoring: [place] PMP: PDMP reviewed during this encounter.       Pharmacotherapy: No side-effects or adverse reactions reported. Compliance: No problems identified. Effectiveness: Clinically acceptable.  No notes on file  No results found for: "CBDTHCR" No results found for: "D8THCCBX" No results found for: "D9THCCBX"  UDS:  No results found for: "SUMMARY"    ROS  Constitutional: Denies any fever or chills Gastrointestinal: No reported hemesis, hematochezia, vomiting, or acute GI distress Musculoskeletal: Denies any acute onset joint swelling, redness, loss of ROM, or weakness Neurological: No reported episodes of acute onset apraxia, aphasia, dysarthria, agnosia, amnesia, paralysis, loss of coordination, or loss of consciousness  Medication Review  PRESCRIPTION MEDICATION, amLODipine, cyclobenzaprine, gabapentin, ondansetron, and oxyCODONE-acetaminophen  History Review  Allergy: Johnathan Harvey is allergic to protonix [pantoprazole]. Drug: Johnathan Harvey  reports that he does not currently use drugs after having used the  following drugs: Marijuana. Alcohol:  reports no history of alcohol use. Tobacco:  reports that he has quit  smoking. His smoking use included cigarettes. He has never used smokeless tobacco. Social: Johnathan Harvey  reports that he has quit smoking. His smoking use included cigarettes. He has never used smokeless tobacco. He reports that he does not currently use drugs after having used the following drugs: Marijuana. He reports that he does not drink alcohol. Medical:  has a past medical history of Arthritis, Esophageal stricture, GERD (gastroesophageal reflux disease), History of MRSA infection (2016), Hypertension, Migraine, and Skin cancer, basal cell. Surgical: Johnathan Harvey  has a past surgical history that includes Ankle arthroplasty; Esophagogastroduodenoscopy (N/A, 11/04/2018); and Inguinal hernia repair (Left, 09/26/2019). Family: family history includes Cancer in his father and mother.  Laboratory Chemistry Profile   Renal Lab Results  Component Value Date   BUN 21 03/17/2022   CREATININE 0.80 03/17/2022   GFRAA >60 10/10/2019   GFRNONAA >60 03/17/2022    Hepatic Lab Results  Component Value Date   AST 20 03/17/2022   ALT 13 03/17/2022   ALBUMIN 4.7 03/17/2022   ALKPHOS 62 03/17/2022   LIPASE 39 03/17/2022    Electrolytes Lab Results  Component Value Date   NA 137 03/17/2022   K 4.3 03/17/2022   CL 105 03/17/2022   CALCIUM 9.3 03/17/2022   MG 2.1 03/11/2021    Bone No results found for: "VD25OH", "VD125OH2TOT", "[id number]", "[id number]", "25OHVITD1", "25OHVITD2", "25OHVITD3", "TESTOFREE", "TESTOSTERONE"  Inflammation (CRP: Acute Phase) (ESR: Chronic Phase) Lab Results  Component Value Date   LATICACIDVEN 0.8 10/10/2019         Note: Above Lab results reviewed.  Recent Imaging Review  DG PAIN CLINIC C-ARM 1-60 MIN NO REPORT Fluoro was used, but no Radiologist interpretation will be provided.  Please refer to "NOTES" tab for provider progress note. Note: Reviewed         Physical Exam  General appearance: Well nourished, well developed, and well hydrated. In no apparent acute distress Mental status: Alert, oriented x 3 (person, place, & time)       Respiratory: No evidence of acute respiratory distress Eyes: PERLA Vitals: There were no vitals taken for this visit. BMI: Estimated body mass index is 20.09 kg/m as calculated from the following:   Height as of 11/23/22: 5\' 10"  (1.778 m).   Weight as of 11/23/22: 140 lb (63.5 kg). Ideal: Patient weight not recorded  Assessment   Diagnosis Status  No diagnosis found. Controlled Controlled Controlled   Updated Problems: No problems updated.  Plan of Care  Problem-specific:  No problem-specific Assessment & Plan notes found for this encounter.  Johnathan Harvey has a current medication list which includes the following long-term medication(s): amlodipine, cyclobenzaprine, and gabapentin.  Pharmacotherapy (Medications Ordered): No orders of the defined types were placed in this encounter.  Orders:  No orders of the defined types were placed in this encounter.  Follow-up plan:   No follow-ups on file.      Interventional Therapies  Risk  Complexity Considerations:   Note: Difficult IV stick.   Planned  Pending:   Therapeutic right C3-C8 cervical MB RFA #1    Under consideration:   Diagnostic right cervical facet C3-C7 RFA #1 & (B) C7 & T1 RFA #1    Completed:   Diagnostic bilateral cervical facet MBB x4 (10/19/2022) (100/100/35/35)  Diagnostic bilateral cervical facet C6-T1 MBB x1 (05/25/2022) (100/100/85/85)  Diagnostic right cervical facet C3-C5 MBB x1 (05/25/2022) (100/100/85/85)  Diagnostic right cervical facet C3-C7 MBB x1 (04/25/2022) (100/100/60/100) (100-Neck/60-H/A)  Diagnostic bilateral cervical facet C7 &  T1 MBB x1 (04/25/2022) (100/100/60/100) (100-Neck/60-H/A)  Diagnostic right Cervical ESI x1 (03/23/2022) (DOS: 10-10/10) (0/0/0/0) (patient indicated having gone to the  ED x4 since procedure.)   Completed by other providers:   Therapeutic right C3 and C4 medial branch RFA x1 (07/30/2020) by Sharlet Salina, DO [facility] PMR)  Diagnostic right C3 and C4 MBB x2 (06/30/2020, 07/15/2020) by Sharlet Salina, DO [facility]R)  Diagnostic right C3-4 zygapophyseal joint (facet joint) inj. x1 (03/05/2020) by Sharlet Salina, DO Therapeutic  ACDF C4/5, C5/6, C6/7 (07/04/2022) by Duffy Rhody, MD ([place] [facility]) (80% improvement)    Therapeutic  Palliative (PRN) options:   None established   Pharmacotherapy:  Nonopioid transferred (10/02/2022): Gabapentin (Neurontin) 300 mg, 3 tabs p.o. at bedtime; Flexeril 10 mg, 1 tab p.o. at bedtime. I believe this regimen to be appropriate for this patient however, we no longer have the manpower to continue managing medications on these patients and therefore I have kindly request that for his PCP to consider taking over it.  The patient was informed that should they decide not to, we do not plan on continuing with this regimen because of the above reasons. Recommendations:   None at this time.        Recent Visits Date Type Provider Dept  11/23/22 Procedure visit Milinda Pointer, MD [facility]  11/02/22 Office Visit Milinda Pointer, MD [facility]  10/19/22 Procedure visit Milinda Pointer, MD [facility]  Showing recent visits within past 90 days and meeting all other requirements Future Appointments Date Type Provider Dept  01/03/23 Appointment Milinda Pointer, MD [facility]  Showing future appointments within next 90 days and meeting all other requirements  I discussed the assessment and treatment plan with the patient. The patient was provided an opportunity to ask questions and all were answered. The patient agreed with the plan and demonstrated an understanding of the instructions.  Patient advised to call back or seek an in-person evaluation if the symptoms or condition  worsens.  Duration of encounter: *** minutes.  Total time on encounter, as per AMA guidelines included both the face-to-face and non-face-to-face time personally spent by the physician and/or other qualified health care professional(s) on the day of the encounter (includes time in activities that require the physician or other qualified health care professional and does not include time in activities normally performed by clinical staff). Physician's time may include the following activities when performed: Preparing to see the patient (e.g., pre-charting review of records, searching for previously ordered imaging, lab work, and nerve conduction tests) Review of prior analgesic pharmacotherapies. Reviewing PMP Interpreting ordered tests (e.g., lab work, imaging, nerve conduction tests) Performing post-procedure evaluations, including interpretation of diagnostic procedures Obtaining and/or reviewing separately obtained history Performing a medically appropriate examination and/or evaluation Counseling and educating the patient/family/caregiver Ordering medications, tests, or procedures Referring and communicating with other health care professionals (when not separately reported) Documenting clinical information in the electronic or other health record Independently interpreting results (not separately reported) and communicating results to the patient/ family/caregiver Care coordination (not separately reported)  Note by: Gaspar Cola, MD Date: 01/03/2023; Time: 2:48 PM

## 2023-01-03 ENCOUNTER — Ambulatory Visit: Payer: No Typology Code available for payment source | Attending: Pain Medicine | Admitting: Pain Medicine

## 2023-01-03 ENCOUNTER — Encounter: Payer: Self-pay | Admitting: Pain Medicine

## 2023-01-03 VITALS — BP 126/80 | HR 71 | Temp 97.5°F | Resp 14 | Ht 70.0 in | Wt 140.0 lb

## 2023-01-03 DIAGNOSIS — M47812 Spondylosis without myelopathy or radiculopathy, cervical region: Secondary | ICD-10-CM | POA: Insufficient documentation

## 2023-01-03 DIAGNOSIS — R937 Abnormal findings on diagnostic imaging of other parts of musculoskeletal system: Secondary | ICD-10-CM | POA: Insufficient documentation

## 2023-01-03 DIAGNOSIS — G4486 Cervicogenic headache: Secondary | ICD-10-CM | POA: Diagnosis not present

## 2023-01-03 DIAGNOSIS — M542 Cervicalgia: Secondary | ICD-10-CM | POA: Diagnosis not present

## 2023-01-03 DIAGNOSIS — R519 Headache, unspecified: Secondary | ICD-10-CM | POA: Diagnosis not present

## 2023-01-24 DIAGNOSIS — C44619 Basal cell carcinoma of skin of left upper limb, including shoulder: Secondary | ICD-10-CM | POA: Diagnosis not present

## 2023-02-19 DIAGNOSIS — M4802 Spinal stenosis, cervical region: Secondary | ICD-10-CM | POA: Diagnosis not present

## 2023-02-21 DIAGNOSIS — R110 Nausea: Secondary | ICD-10-CM | POA: Diagnosis not present

## 2023-02-21 DIAGNOSIS — R1084 Generalized abdominal pain: Secondary | ICD-10-CM | POA: Diagnosis not present

## 2023-02-21 DIAGNOSIS — R634 Abnormal weight loss: Secondary | ICD-10-CM | POA: Diagnosis not present

## 2023-02-22 ENCOUNTER — Other Ambulatory Visit: Payer: Self-pay | Admitting: Internal Medicine

## 2023-02-22 DIAGNOSIS — R634 Abnormal weight loss: Secondary | ICD-10-CM

## 2023-02-22 DIAGNOSIS — R1084 Generalized abdominal pain: Secondary | ICD-10-CM

## 2023-02-22 DIAGNOSIS — R110 Nausea: Secondary | ICD-10-CM

## 2023-03-02 ENCOUNTER — Ambulatory Visit: Admission: RE | Admit: 2023-03-02 | Payer: No Typology Code available for payment source | Source: Ambulatory Visit

## 2023-03-14 DIAGNOSIS — R634 Abnormal weight loss: Secondary | ICD-10-CM | POA: Diagnosis not present

## 2023-04-24 NOTE — Progress Notes (Unsigned)
PROVIDER NOTE: Information contained herein reflects review and annotations entered in association with encounter. Interpretation of such information and data should be left to medically-trained personnel. Information provided to patient can be located elsewhere in the medical record under "Patient Instructions". Document created using STT-dictation technology, any transcriptional errors that may result from process are unintentional.    Patient: Johnathan Harvey  Service Category: E/M  Provider: Oswaldo Done, MD  DOB: 1956/10/07  DOS: 04/25/2023  Referring Provider: Danella Penton, MD  MRN: [id number]  Specialty: Interventional Pain Management  PCP: Danella Penton, MD  Type: Established Patient  Setting: Ambulatory outpatient    Location: Office  Delivery: Face-to-face     HPI  Mr. Zayvien Canning Thakur, a 67 y.o. year old male, is here today because of his No primary diagnosis found.. Mr. Pelham's primary complain today is No chief complaint on file.  Pertinent problems: Mr. Chismar has Arthritis; Cervical disc disease; Cervical myofascial pain syndrome; Chronic tension-type headache, intractable; Gout; Headache disorder; Cervicalgia (1ry area of Pain); Septic prepatellar bursitis of knee (Left); Abnormal MRI, cervical spine (09/28/2021); Chronic pain syndrome; Grade 1 Anterolisthesis of cervical spine (C3/C4 & C7/T1); Cervical facet arthropathy (Multilevel) (Bilateral); DDD (degenerative disc disease), cervical; Cervical foraminal stenosis (Bilateral: C6-7) (Right: C4-5) (Left: C5-6); Cervicogenic headache (2ry area of Pain) (Right); Cervical facet syndrome; Spondylosis without myelopathy or radiculopathy, cervical region; Occipital headache (Right); Neck pain of over 3 months duration; Cervical neck pain with evidence of disc disease; Chronic neck and back pain; Musculoskeletal disorder involving upper trapezius muscle (Right); Cervical facet hypertrophy (Multilevel) (Bilateral); Postoperative  pain after spinal surgery; Neck pain with history of cervical spinal surgery; and Spinal stenosis in cervical region on their pertinent problem list. Pain Assessment: Severity of   is reported as a  /10. Location:    / . Onset:  . Quality:  . Timing:  . Modifying factor(s):  Marland Kitchen Vitals:  vitals were not taken for this visit.  BMI: Estimated body mass index is 20.09 kg/m as calculated from the following:   Height as of 01/03/23: 5\' 10"  (1.778 m).   Weight as of 01/03/23: 140 lb (63.5 kg). Last encounter: 01/03/2023. Last procedure: 11/23/2022.  Reason for encounter: evaluation of worsening, or previously known (established) problem. ***  Pharmacotherapy Assessment  Analgesic: No chronic opioid analgesics therapy prescribed by our practice.  Oxycodone/APAP 5/325 (# 20) 1 4 times daily (last filled on 08/31/2022) prescribed by Ander Gaster, MD  MME/day: 30 mg/day   Monitoring: [place] PMP: PDMP reviewed during this encounter.       Pharmacotherapy: No side-effects or adverse reactions reported. Compliance: No problems identified. Effectiveness: Clinically acceptable.  No notes on file  No results found for: "CBDTHCR" No results found for: "D8THCCBX" No results found for: "D9THCCBX"  UDS:  No results found for: "SUMMARY"    ROS  Constitutional: Denies any fever or chills Gastrointestinal: No reported hemesis, hematochezia, vomiting, or acute GI distress Musculoskeletal: Denies any acute onset joint swelling, redness, loss of ROM, or weakness Neurological: No reported episodes of acute onset apraxia, aphasia, dysarthria, agnosia, amnesia, paralysis, loss of coordination, or loss of consciousness  Medication Review  amLODipine, cyclobenzaprine, gabapentin, hydrochlorothiazide, and ondansetron  History Review  Allergy: Mr. Allman is allergic to protonix [pantoprazole]. Drug: Mr. Aversa  reports that he does not currently use drugs after having used the following drugs:  Marijuana. Alcohol:  reports no history of alcohol use. Tobacco:  reports that he has quit smoking. His  smoking use included cigarettes. He has never used smokeless tobacco. Social: Mr. Kouba  reports that he has quit smoking. His smoking use included cigarettes. He has never used smokeless tobacco. He reports that he does not currently use drugs after having used the following drugs: Marijuana. He reports that he does not drink alcohol. Medical:  has a past medical history of Arthritis, Esophageal stricture, GERD (gastroesophageal reflux disease), History of MRSA infection (2016), Hypertension, Migraine, and Skin cancer, basal cell. Surgical: Mr. Nordin  has a past surgical history that includes Ankle arthroplasty; Esophagogastroduodenoscopy (N/A, 11/04/2018); and Inguinal hernia repair (Left, 09/26/2019). Family: family history includes Cancer in his father and mother.  Laboratory Chemistry Profile   Renal Lab Results  Component Value Date   BUN 21 03/17/2022   CREATININE 0.80 03/17/2022   GFRAA >60 10/10/2019   GFRNONAA >60 03/17/2022    Hepatic Lab Results  Component Value Date   AST 20 03/17/2022   ALT 13 03/17/2022   ALBUMIN 4.7 03/17/2022   ALKPHOS 62 03/17/2022   LIPASE 39 03/17/2022    Electrolytes Lab Results  Component Value Date   NA 137 03/17/2022   K 4.3 03/17/2022   CL 105 03/17/2022   CALCIUM 9.3 03/17/2022   MG 2.1 03/11/2021    Bone No results found for: "VD25OH", "VD125OH2TOT", "[id number]", "[id number]", "25OHVITD1", "25OHVITD2", "25OHVITD3", "TESTOFREE", "TESTOSTERONE"  Inflammation (CRP: Acute Phase) (ESR: Chronic Phase) Lab Results  Component Value Date   LATICACIDVEN 0.8 10/10/2019         Note: Above Lab results reviewed.  Recent Imaging Review  DG PAIN CLINIC C-ARM 1-60 MIN NO REPORT Fluoro was used, but no Radiologist interpretation will be provided.  Please refer to "NOTES" tab for provider progress note. Note: Reviewed         Physical Exam  General appearance: Well nourished, well developed, and well hydrated. In no apparent acute distress Mental status: Alert, oriented x 3 (person, place, & time)       Respiratory: No evidence of acute respiratory distress Eyes: PERLA Vitals: There were no vitals taken for this visit. BMI: Estimated body mass index is 20.09 kg/m as calculated from the following:   Height as of 01/03/23: 5\' 10"  (1.778 m).   Weight as of 01/03/23: 140 lb (63.5 kg). Ideal: Patient weight not recorded  Assessment   Diagnosis Status  No diagnosis found. Controlled Controlled Controlled   Updated Problems: No problems updated.  Plan of Care  Problem-specific:  No problem-specific Assessment & Plan notes found for this encounter.  Mr. Netanel Yannuzzi Mccutchen has a current medication list which includes the following long-term medication(s): amlodipine, cyclobenzaprine, gabapentin, and hydrochlorothiazide.  Pharmacotherapy (Medications Ordered): No orders of the defined types were placed in this encounter.  Orders:  No orders of the defined types were placed in this encounter.  Follow-up plan:   No follow-ups on file.      Interventional Therapies  Risk  Complexity Considerations:   Note: Difficult IV stick.   Planned  Pending:      Under consideration:   Diagnostic right cervical facet C3-C7 RFA #1 & (B) C7 & T1 RFA #1    Completed:   Therapeutic right C3-C8 cervical MB RFA x1 (11/23/2022) (100/100/100/97) Diagnostic bilateral cervical facet MBB x4 (10/19/2022) (100/100/35/35)  Diagnostic bilateral cervical facet C6-T1 MBB x1 (05/25/2022) (100/100/85/85)  Diagnostic right cervical facet C3-C5 MBB x1 (05/25/2022) (100/100/85/85)  Diagnostic right cervical facet C3-C7 MBB x1 (04/25/2022) (100/100/60/100) (100-Neck/60-H/A)  Diagnostic bilateral cervical facet C7 &  T1 MBB x1 (04/25/2022) (100/100/60/100) (100-Neck/60-H/A)  Diagnostic right Cervical ESI x1 (03/23/2022) (DOS: 10-10/10)  (0/0/0/0) (patient indicated having gone to the ED x4 since procedure.)   Completed by other providers:   Therapeutic right C3 and C4 medial branch RFA x1 (07/30/2020) by Merri Ray, DO [facility] PMR)  Diagnostic right C3 and C4 MBB x2 (06/30/2020, 07/15/2020) by Merri Ray, DO [facility] PMR)  Diagnostic right C3-4 zygapophyseal joint (facet joint) inj. x1 (03/05/2020) by Merri Ray, DO Therapeutic  ACDF C4/5, C5/6, C6/7 (07/04/2022) by Hoyt Koch, MD ([place] [facility]) (80% improvement)    Therapeutic  Palliative (PRN) options:   None established   Pharmacotherapy:  Nonopioid transferred (10/02/2022): Gabapentin (Neurontin) 300 mg, 3 tabs p.o. at bedtime; Flexeril 10 mg, 1 tab p.o. at bedtime. I believe this regimen to be appropriate for this patient however, we no longer have the manpower to continue managing medications on these patients and therefore I have kindly request that for his PCP to consider taking over it.  The patient was informed that should they decide not to, we do not plan on continuing with this regimen because of the above reasons. Recommendations:   None at this time.         Recent Visits No visits were found meeting these conditions. Showing recent visits within past 90 days and meeting all other requirements Future Appointments Date Type Provider Dept  04/25/23 Appointment Delano Metz, MD [facility]  Showing future appointments within next 90 days and meeting all other requirements  I discussed the assessment and treatment plan with the patient. The patient was provided an opportunity to ask questions and all were answered. The patient agreed with the plan and demonstrated an understanding of the instructions.  Patient advised to call back or seek an in-person evaluation if the symptoms or condition worsens.  Duration of encounter: *** minutes.  Total time on encounter, as per AMA guidelines included both the face-to-face and non-face-to-face  time personally spent by the physician and/or other qualified health care professional(s) on the day of the encounter (includes time in activities that require the physician or other qualified health care professional and does not include time in activities normally performed by clinical staff). Physician's time may include the following activities when performed: Preparing to see the patient (e.g., pre-charting review of records, searching for previously ordered imaging, lab work, and nerve conduction tests) Review of prior analgesic pharmacotherapies. Reviewing PMP Interpreting ordered tests (e.g., lab work, imaging, nerve conduction tests) Performing post-procedure evaluations, including interpretation of diagnostic procedures Obtaining and/or reviewing separately obtained history Performing a medically appropriate examination and/or evaluation Counseling and educating the patient/family/caregiver Ordering medications, tests, or procedures Referring and communicating with other health care professionals (when not separately reported) Documenting clinical information in the electronic or other health record Independently interpreting results (not separately reported) and communicating results to the patient/ family/caregiver Care coordination (not separately reported)  Note by: Oswaldo Done, MD Date: 04/25/2023; Time: 8:54 AM

## 2023-04-25 ENCOUNTER — Encounter: Payer: Self-pay | Admitting: Pain Medicine

## 2023-04-25 ENCOUNTER — Ambulatory Visit: Payer: No Typology Code available for payment source | Attending: Pain Medicine | Admitting: Pain Medicine

## 2023-04-25 VITALS — BP 110/71 | HR 66 | Temp 98.2°F | Resp 16 | Ht 70.0 in | Wt 130.0 lb

## 2023-04-25 DIAGNOSIS — M542 Cervicalgia: Secondary | ICD-10-CM | POA: Diagnosis not present

## 2023-04-25 DIAGNOSIS — M47812 Spondylosis without myelopathy or radiculopathy, cervical region: Secondary | ICD-10-CM | POA: Diagnosis not present

## 2023-04-25 DIAGNOSIS — G4486 Cervicogenic headache: Secondary | ICD-10-CM | POA: Diagnosis not present

## 2023-04-25 DIAGNOSIS — M4312 Spondylolisthesis, cervical region: Secondary | ICD-10-CM | POA: Diagnosis not present

## 2023-04-25 DIAGNOSIS — G894 Chronic pain syndrome: Secondary | ICD-10-CM

## 2023-04-25 NOTE — Progress Notes (Signed)
Safety precautions to be maintained throughout the outpatient stay will include: orient to surroundings, keep bed in low position, maintain call bell within reach at all times, provide assistance with transfer out of bed and ambulation.  

## 2023-04-25 NOTE — Patient Instructions (Signed)

## 2023-05-14 NOTE — Patient Instructions (Signed)

## 2023-05-14 NOTE — Progress Notes (Unsigned)
PROVIDER NOTE: Interpretation of information contained herein should be left to medically-trained personnel. Specific patient instructions are provided elsewhere under "Patient Instructions" section of medical record. This document was created in part using STT-dictation technology, any transcriptional errors that may result from this process are unintentional.  Patient: Johnathan Harvey Type: Established DOB: 01-Dec-1955 MRN: [id number] PCP: Danella Penton, MD  Service: Procedure DOS: 05/15/2023 Setting: Ambulatory Location: Ambulatory outpatient facility Delivery: Face-to-face Provider: Oswaldo Done, MD Specialty: Interventional Pain Management Specialty designation: 09 Location: Outpatient facility Ref. Prov.: Delano Metz, MD       Interventional Therapy   Procedure: Cervical Facet Medial Branch Block(s) #5  Laterality: Bilateral  Level: C6, C7, and C8 Medial Branch Level(s). Injecting these levels blocks the C6-7 and C7-T1 cervical facet joints.  Imaging: Fluoroscopic guidance Anesthesia: Local anesthesia (1-2% Lidocaine) Anxiolysis: IV Versed 3.0 mg Sedation: Moderate Sedation Fentanyl 1 mL (50 mcg) DOS: 05/15/2023  Performed by: Oswaldo Done, MD  Purpose: Diagnostic/Therapeutic Indications: Cervicalgia (cervical spine axial pain) severe enough to impact quality of life or function. 1. Cervicalgia (1ry area of Pain)   2. Chronic neck and back pain   3. Cervical facet joint pain   4. Cervical facet syndrome   5. Cervical facet hypertrophy (Multilevel) (Bilateral)   6. Cervical facet arthropathy (Multilevel) (Bilateral)   7. Spondylosis without myelopathy or radiculopathy, cervical region   8. 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 DDD (degenerative disc disease), cervical   10. Abnormal MRI, cervical spine (09/28/2021)    NAS-11 Pain score:   Pre-procedure: 2 /10   Post-procedure: 0-No pain/10     Position / Prep / Materials:   Position: Prone. Head in cradle. C-spine slightly flexed. Prep solution: DuraPrep (Iodine Povacrylex [0.7% available iodine] and Isopropyl Alcohol, 74% w/w) Prep Area: Posterior Cervico-thoracic Region. From occipital ridge to tip of scapula, and from shoulder to shoulder. Entire posterior and lateral neck surface. Materials:  Tray: Block Needle(s):  Type: Spinal  Gauge (G): 22  Length: 3.5-in  Qty:  3      H&P (Pre-op Assessment):  Johnathan Harvey is a 67 y.o. (year old), male patient, seen today for interventional treatment. He  has a past surgical history that includes Ankle arthroplasty; Esophagogastroduodenoscopy (N/A, 11/04/2018); and Inguinal hernia repair (Left, 09/26/2019). Johnathan Harvey has a current medication list which includes the following prescription(s): amlodipine, aspirin-acetaminophen-caffeine, hydrochlorothiazide, omeprazole, cyclobenzaprine, and gabapentin, and the following Facility-Administered Medications: fentanyl. His primarily concern today is the Neck Pain (Shoulders bilateral)  Initial Vital Signs:  Pulse/HCG Rate: 65ECG Heart Rate: 61 (NSR) Temp: 98.1 F (36.7 C) Resp: 16 BP: 118/79 SpO2: 98 %  BMI: Estimated body mass index is 18.65 kg/m as calculated from the following:   Height as of this encounter: 5\' 10"  (1.778 m).   Weight as of this encounter: 130 lb (59 kg).  Risk Assessment: Allergies: Reviewed. He is allergic to protonix [pantoprazole].  Allergy Precautions: None required Coagulopathies: Reviewed. None identified.  Blood-thinner therapy: None at this time Active Infection(s): Reviewed. None identified. Johnathan Harvey is afebrile  Site Confirmation: Johnathan Harvey was asked to confirm the procedure and laterality before marking the site Procedure checklist: Completed Consent: Before the procedure and under the influence of no sedative(s), amnesic(s), or anxiolytics, the patient was informed of the treatment options, risks and possible  complications. To fulfill our ethical and legal obligations, as recommended by the American Medical Association's Code of Ethics, I have informed the patient of  my clinical impression; the nature and purpose of the treatment or procedure; the risks, benefits, and possible complications of the intervention; the alternatives, including doing nothing; the risk(s) and benefit(s) of the alternative treatment(s) or procedure(s); and the risk(s) and benefit(s) of doing nothing. The patient was provided information about the general risks and possible complications associated with the procedure. These may include, but are not limited to: failure to achieve desired goals, infection, bleeding, organ or nerve damage, allergic reactions, paralysis, and death. In addition, the patient was informed of those risks and complications associated to Spine-related procedures, such as failure to decrease pain; infection (i.e.: Meningitis, epidural or intraspinal abscess); bleeding (i.e.: epidural hematoma, subarachnoid hemorrhage, or any other type of intraspinal or peri-dural bleeding); organ or nerve damage (i.e.: Any type of peripheral nerve, nerve root, or spinal cord injury) with subsequent damage to sensory, motor, and/or autonomic systems, resulting in permanent pain, numbness, and/or weakness of one or several areas of the body; allergic reactions; (i.e.: anaphylactic reaction); and/or death. Furthermore, the patient was informed of those risks and complications associated with the medications. These include, but are not limited to: allergic reactions (i.e.: anaphylactic or anaphylactoid reaction(s)); adrenal axis suppression; blood sugar elevation that in diabetics may result in ketoacidosis or comma; water retention that in patients with history of congestive heart failure may result in shortness of breath, pulmonary edema, and decompensation with resultant heart failure; weight gain; swelling or edema; medication-induced  neural toxicity; particulate matter embolism and blood vessel occlusion with resultant organ, and/or nervous system infarction; and/or aseptic necrosis of one or more joints. Finally, the patient was informed that Medicine is not an exact science; therefore, there is also the possibility of unforeseen or unpredictable risks and/or possible complications that may result in a catastrophic outcome. The patient indicated having understood very clearly. We have given the patient no guarantees and we have made no promises. Enough time was given to the patient to ask questions, all of which were answered to the patient's satisfaction. Mr. Hill has indicated that he wanted to continue with the procedure. Attestation: I, the ordering provider, attest that I have discussed with the patient the benefits, risks, side-effects, alternatives, likelihood of achieving goals, and potential problems during recovery for the procedure that I have provided informed consent. Date  Time: 05/15/2023  8:02 AM   Pre-Procedure Preparation:  Monitoring: As per clinic protocol. Respiration, ETCO2, SpO2, BP, heart rate and rhythm monitor placed and checked for adequate function Safety Precautions: Patient was assessed for positional comfort and pressure points before starting the procedure. Time-out: I initiated and conducted the "Time-out" before starting the procedure, as per protocol. The patient was asked to participate by confirming the accuracy of the "Time Out" information. Verification of the correct person, site, and procedure were performed and confirmed by me, the nursing staff, and the patient. "Time-out" conducted as per Joint Commission's Universal Protocol (UP.01.01.01). Time: 0827 Start Time: 0827 hrs.  Description/Narrative of Procedure:          Laterality: See above. Targeted Levels: See above.  Rationale (medical necessity): procedure needed and proper for the diagnosis and/or treatment of the patient's  medical symptoms and needs. Procedural Technique Safety Precautions: Aspiration looking for blood return was conducted prior to all injections. At no point did we inject any substances, as a needle was being advanced. No attempts were made at seeking any paresthesias. Safe injection practices and needle disposal techniques used. Medications properly checked for expiration dates. SDV (single dose vial)  medications used. Description of the Procedure: Protocol guidelines were followed. The patient was assisted into a comfortable position. The target area was identified and the area prepped in the usual manner. Skin & deeper tissues infiltrated with local anesthetic. Appropriate amount of time allowed to pass for local anesthetics to take effect. The procedure needles were then advanced to the target area. Proper needle placement secured. Negative aspiration confirmed. Solution injected in intermittent fashion, asking for systemic symptoms every 0.5cc of injectate. The needles were then removed and the area cleansed, making sure to leave some of the prepping solution back to take advantage of its long term bactericidal properties.  Technical description of process:   C6 Medial Branch Nerve Block (MBB): The target area for the C6 dorsal medial articular branch is the lateral concave waist of the articular pillar of C6. Under fluoroscopic guidance, a Quincke needle was inserted until contact was made with os over the postero-lateral aspect of the articular pillar of C6 (target area). After negative aspiration for blood, 0.5 mL of the nerve block solution was injected without difficulty or complication. The needle was removed intact. C7 Medial Branch Nerve Block (MBB): The target for the C7 dorsal medial articular branch lies on the superior-medial tip of the C7 transverse process. Under fluoroscopic guidance, a Quincke needle was inserted until contact was made with os over the postero-lateral aspect of the  articular pillar of C7 (target area). After negative aspiration for blood, 0.5 mL of the nerve block solution was injected without difficulty or complication. The needle was removed intact. C8 Medial Branch Nerve Block (MBB): The target for the C8 dorsal medial articular branch lies on the superior-lateral aspect of the T1 transverse process. Under fluoroscopic guidance, a Quincke needle was inserted until contact was made with os over the postero-lateral aspect of the articular pillar of T1 (target area). After negative aspiration for blood, 0.5 mL of the nerve block solution was injected without difficulty or complication. The needle was removed intact.  Once the entire procedure was completed, the treated area was cleaned, making sure to leave some of the prepping solution back to take advantage of its long term bactericidal properties.  Anatomy Reference Guide:      Facet Joint Innervation  C1-2 Third occipital Nerve (TON)  C2-3 TON, C3  Medial Branch  C3-4 C3, C4         "          "  C4-5 C4, C5         "          "  C5-6 C5, C6         "          "  C6-7 C6, C7         "          "  C7-T1 C7, C8         "          "   Cervical Facet Pain Pattern overlap:   Vitals:   05/15/23 0852 05/15/23 0859 05/15/23 0910 05/15/23 0920  BP: (!) 116/91 108/66 102/66 108/65  Pulse:      Resp: 18 16 16 16   Temp:  98.2 F (36.8 C)    SpO2: 100% 99% 99% 100%  Weight:      Height:         Start Time: 0827 hrs. End Time: 0851 hrs.  Imaging Guidance (Spinal):  Type of Imaging Technique: Fluoroscopy Guidance (Spinal) Indication(s): Assistance in needle guidance and placement for procedures requiring needle placement in or near specific anatomical locations not easily accessible without such assistance. Exposure Time: Please see nurses notes. Contrast: None used. Fluoroscopic Guidance: I was personally present during the use of fluoroscopy. "Tunnel Vision Technique" used to obtain the  best possible view of the target area. Parallax error corrected before commencing the procedure. "Direction-depth-direction" technique used to introduce the needle under continuous pulsed fluoroscopy. Once target was reached, antero-posterior, oblique, and lateral fluoroscopic projection used confirm needle placement in all planes. Images permanently stored in EMR. Interpretation: No contrast injected. I personally interpreted the imaging intraoperatively. Adequate needle placement confirmed in multiple planes. Permanent images saved into the patient's record.  Post-operative Assessment:  Post-procedure Vital Signs:  Pulse/HCG Rate: 65(!) 55 Temp: 98.2 F (36.8 C) Resp: 16 BP: 108/65 SpO2: 100 %  EBL: None  Complications: No immediate post-treatment complications observed by team, or reported by patient.  Note: The patient tolerated the entire procedure well. A repeat set of vitals were taken after the procedure and the patient was kept under observation following institutional policy, for this type of procedure. Post-procedural neurological assessment was performed, showing return to baseline, prior to discharge. The patient was provided with post-procedure discharge instructions, including a section on how to identify potential problems. Should any problems arise concerning this procedure, the patient was given instructions to immediately contact us, at any time, without hesitation. In any case, we plan to contact the patient by telephone for a follow-up status report regarding this interventional procedure.  Comments:  No additional relevant information.  Plan of Care (POC)  Orders:  Orders Placed This Encounter  Procedures   CERVICAL FACET (MEDIAL BRANCH NERVE BLOCK)     Scheduling Instructions:     Side: Bilateral     Level: C6-7 and C7-T1 Facet joints (C6, C7, and C8 Medial Branch Nerves)     Sedation: Patient's choice.     Timeframe: Today    Order Specific Question:   Where will  this procedure be performed?    Answer:   [facility]   DG PAIN [facility] C-ARM 1-60 MIN NO REPORT    Intraoperative interpretation by procedural physician at [facility].    Standing Status:   Standing    Number of Occurrences:   1    Order Specific Question:   Reason for exam:    Answer:   Assistance in needle guidance and placement for procedures requiring needle placement in or near specific anatomical locations not easily accessible without such assistance.   Informed Consent Details: Physician/Practitioner Attestation; Transcribe to consent form and obtain patient signature    Note: Always confirm laterality of pain with Mr. Koglin, before procedure. Transcribe to consent form and obtain patient signature.    Order Specific Question:   Physician/Practitioner attestation of informed consent for procedure/surgical case    Answer:   I, the physician/practitioner, attest that I have discussed with the patient the benefits, risks, side effects, alternatives, likelihood of achieving goals and potential problems during recovery for the procedure that I have provided informed consent.    Order Specific Question:   Procedure    Answer:   Bilateral Cervical facet block under fluoroscopic guidance.    Order Specific Question:   Physician/Practitioner performing the procedure    Answer:   Suheyla Mortellaro A. Laban Emperor, MD    Order Specific Question:   Indication/Reason    Answer:  Chronic neck pain secondary to cervical facet syndrome   Provide equipment / supplies at bedside    Procedure tray: "Block Tray" (Disposable  single use) Skin infiltration needle: Regular 1.5-in, 25-G, (x1) Block Needle type: Spinal Amount/quantity: 3 Size: Regular (3.5-inch) Gauge: 22G    Standing Status:   Standing    Number of Occurrences:   1    Order Specific Question:   Specify    Answer:   Block Tray   Chronic Opioid Analgesic:  No chronic opioid analgesics therapy prescribed by our practice.   Oxycodone/APAP 5/325 (# 20) 1 4 times daily (last filled on 08/31/2022) prescribed by Ander Gaster, MD  MME/day: 30 mg/day   Medications ordered for procedure: Meds ordered this encounter  Medications   lidocaine (XYLOCAINE) 2 % (with pres) injection 400 mg   pentafluoroprop-tetrafluoroeth (GEBAUERS) aerosol   lactated ringers infusion   midazolam (VERSED) 5 MG/5ML injection 0.5-2 mg    Make sure Flumazenil is available in the pyxis when using this medication. If oversedation occurs, administer 0.2 mg IV over 15 sec. If after 45 sec no response, administer 0.2 mg again over 1 min; may repeat at 1 min intervals; not to exceed 4 doses (1 mg)   fentaNYL (SUBLIMAZE) injection 25-50 mcg    Make sure Narcan is available in the pyxis when using this medication. In the event of respiratory depression (RR< 8/min): Titrate NARCAN (naloxone) in increments of 0.1 to 0.2 mg IV at 2-3 minute intervals, until desired degree of reversal.   ropivacaine (PF) 2 mg/mL (0.2%) (NAROPIN) injection 18 mL   dexamethasone (DECADRON) injection 20 mg   Medications administered: We administered lidocaine, pentafluoroprop-tetrafluoroeth, lactated ringers, midazolam, fentaNYL, ropivacaine (PF) 2 mg/mL (0.2%), and dexamethasone.  See the medical record for exact dosing, route, and time of administration.  Follow-up plan:   Return in about 2 weeks (around 05/29/2023) for Proc-day (T,Th), (Face2F), (PPE).       Interventional Therapies  Risk  Complexity Considerations:   Note: Difficult IV stick.   Planned  Pending:      Under consideration:   Diagnostic left cervical facet C6-C8 RFA #1   Facet Joint Innervation  C1-2 Third occipital Nerve (TON)  C2-3 TON, C3  Medial Branch  C3-4 C3, C4         "          "  C4-5 C4, C5         "          "  C5-6 C5, C6         "          "  C6-7 C6, C7         "          "  C7-T1 C7, C8         "          "     Completed:   Therapeutic right C3-C8 cervical MB  RFA x1 (11/23/2022) (100/100/100/97) Diagnostic bilateral cervical facet MBB x4 (10/19/2022) (100/100/35/35)  Diagnostic bilateral cervical facet C6-T1 MBB x1 (05/25/2022) (100/100/85/85)  Diagnostic right cervical facet C3-C5 MBB x1 (05/25/2022) (100/100/85/85)  Diagnostic right cervical facet C3-C7 MBB x1 (04/25/2022) (100/100/60/100) (100-Neck/60-H/A)  Diagnostic bilateral cervical facet C7 & T1 MBB x1 (04/25/2022) (100/100/60/100) (100-Neck/60-H/A)  Diagnostic right Cervical ESI x1 (03/23/2022) (DOS: 10-10/10) (0/0/0/0) (patient indicated having gone to the ED x4 since procedure.)   Completed by other providers:   Therapeutic right  C3 and C4 medial branch RFA x1 (07/30/2020) by Merri Ray, DO [facility] PMR)  Diagnostic right C3 and C4 MBB x2 (06/30/2020, 07/15/2020) by Merri Ray, DO [facility] PMR)  Diagnostic right C3-4 zygapophyseal joint (facet joint) inj. x1 (03/05/2020) by Merri Ray, DO Therapeutic  ACDF C4/5, C5/6, C6/7 (07/04/2022) by Hoyt Koch, MD ([facility]) (80% improvement)    Therapeutic  Palliative (PRN) options:   None established   Pharmacotherapy:  Nonopioid transferred (10/02/2022): Gabapentin (Neurontin) 300 mg, 3 tabs p.o. at bedtime; Flexeril 10 mg, 1 tab p.o. at bedtime. I believe this regimen to be appropriate for this patient however, we no longer have the manpower to continue managing medications on these patients and therefore I have kindly request that for his PCP to consider taking over it.  The patient was informed that should they decide not to, we do not plan on continuing with this regimen because of the above reasons. Recommendations:   None at this time.       Recent Visits Date Type Provider Dept  04/25/23 Office Visit Delano Metz, MD [facility]  Showing recent visits within past 90 days and meeting all other requirements Today's Visits Date Type Provider Dept  05/15/23 Procedure visit Delano Metz, MD [facility]   Showing today's visits and meeting all other requirements Future Appointments Date Type Provider Dept  05/29/23 Appointment Delano Metz, MD [facility]  Showing future appointments within next 90 days and meeting all other requirements  Disposition: Discharge home  Discharge (Date  Time): 05/15/2023; 0924 hrs.   Primary Care Physician: Danella Penton, MD Location: [facility] Note by: Oswaldo Done, MD (TTS technology used. I apologize for any typographical errors that were not detected and corrected.) Date: 05/15/2023; Time: 9:57 AM  Disclaimer:  Medicine is not an Visual merchandiser. The only guarantee in medicine is that nothing is guaranteed. It is important to note that the decision to proceed with this intervention was based on the information collected from the patient. The Data and conclusions were drawn from the patient's questionnaire, the interview, and the physical examination. Because the information was provided in large part by the patient, it cannot be guaranteed that it has not been purposely or unconsciously manipulated. Every effort has been made to obtain as much relevant data as possible for this evaluation. It is important to note that the conclusions that lead to this procedure are derived in large part from the available data. Always take into account that the treatment will also be dependent on availability of resources and existing treatment guidelines, considered by other Pain Management Practitioners as being common knowledge and practice, at the time of the intervention. For Medico-Legal purposes, it is also important to point out that variation in procedural techniques and pharmacological choices are the acceptable norm. The indications, contraindications, technique, and results of the above procedure should only be interpreted and judged by a Board-Certified Interventional Pain Specialist with extensive familiarity and  expertise in the same exact procedure and technique.

## 2023-05-15 ENCOUNTER — Encounter: Payer: Self-pay | Admitting: Pain Medicine

## 2023-05-15 ENCOUNTER — Ambulatory Visit: Payer: No Typology Code available for payment source | Attending: Pain Medicine | Admitting: Pain Medicine

## 2023-05-15 ENCOUNTER — Ambulatory Visit
Admission: RE | Admit: 2023-05-15 | Discharge: 2023-05-15 | Disposition: A | Discharge status: Home / Self Care | Payer: No Typology Code available for payment source | Source: Ambulatory Visit | Attending: Pain Medicine | Admitting: Pain Medicine

## 2023-05-15 VITALS — BP 108/65 | HR 65 | Temp 98.2°F | Resp 16 | Ht 70.0 in | Wt 130.0 lb

## 2023-05-15 DIAGNOSIS — M549 Dorsalgia, unspecified: Secondary | ICD-10-CM

## 2023-05-15 DIAGNOSIS — M4312 Spondylolisthesis, cervical region: Secondary | ICD-10-CM

## 2023-05-15 DIAGNOSIS — G8929 Other chronic pain: Secondary | ICD-10-CM | POA: Insufficient documentation

## 2023-05-15 DIAGNOSIS — G4486 Cervicogenic headache: Secondary | ICD-10-CM | POA: Diagnosis not present

## 2023-05-15 DIAGNOSIS — R937 Abnormal findings on diagnostic imaging of other parts of musculoskeletal system: Secondary | ICD-10-CM

## 2023-05-15 DIAGNOSIS — M542 Cervicalgia: Secondary | ICD-10-CM | POA: Diagnosis present

## 2023-05-15 DIAGNOSIS — M5030 Other cervical disc degeneration, unspecified cervical region: Secondary | ICD-10-CM

## 2023-05-15 DIAGNOSIS — M47812 Spondylosis without myelopathy or radiculopathy, cervical region: Secondary | ICD-10-CM | POA: Diagnosis not present

## 2023-05-15 MED ORDER — FENTANYL CITRATE (PF) 100 MCG/2ML IJ SOLN
25.0000 ug | INTRAMUSCULAR | Status: DC | PRN
  Administered 2023-05-15: 50 ug via INTRAVENOUS

## 2023-05-15 MED ORDER — DEXAMETHASONE SODIUM PHOSPHATE 10 MG/ML IJ SOLN
20.0000 mg | Freq: Once | INTRAMUSCULAR | Status: AC
  Administered 2023-05-15: 20 mg

## 2023-05-15 MED ORDER — MIDAZOLAM HCL 5 MG/5ML IJ SOLN
INTRAMUSCULAR | Status: AC
  Filled 2023-05-15: qty 5

## 2023-05-15 MED ORDER — ROPIVACAINE HCL 2 MG/ML IJ SOLN
18.0000 mL | Freq: Once | INTRAMUSCULAR | Status: AC
  Administered 2023-05-15: 18 mL via PERINEURAL

## 2023-05-15 MED ORDER — LIDOCAINE HCL 2 % IJ SOLN
INTRAMUSCULAR | Status: AC
  Filled 2023-05-15: qty 20

## 2023-05-15 MED ORDER — PENTAFLUOROPROP-TETRAFLUOROETH EX AERO
INHALATION_SPRAY | Freq: Once | CUTANEOUS | Status: AC
  Administered 2023-05-15: 30 via TOPICAL
  Filled 2023-05-15: qty 30

## 2023-05-15 MED ORDER — DEXAMETHASONE SODIUM PHOSPHATE 10 MG/ML IJ SOLN
INTRAMUSCULAR | Status: AC
  Filled 2023-05-15: qty 2

## 2023-05-15 MED ORDER — FENTANYL CITRATE (PF) 100 MCG/2ML IJ SOLN
INTRAMUSCULAR | Status: AC
  Filled 2023-05-15: qty 2

## 2023-05-15 MED ORDER — LIDOCAINE HCL 2 % IJ SOLN
20.0000 mL | Freq: Once | INTRAMUSCULAR | Status: AC
  Administered 2023-05-15: 400 mg

## 2023-05-15 MED ORDER — LACTATED RINGERS IV SOLN: Freq: Once | INTRAVENOUS | Status: AC

## 2023-05-15 MED ORDER — ROPIVACAINE HCL 2 MG/ML IJ SOLN
INTRAMUSCULAR | Status: AC
  Filled 2023-05-15: qty 20

## 2023-05-15 MED ORDER — MIDAZOLAM HCL 5 MG/5ML IJ SOLN
0.5000 mg | Freq: Once | INTRAMUSCULAR | Status: AC
  Administered 2023-05-15: 3 mg via INTRAVENOUS

## 2023-05-15 NOTE — Progress Notes (Signed)
Safety precautions to be maintained throughout the outpatient stay will include: orient to surroundings, keep bed in low position, maintain call bell within reach at all times, provide assistance with transfer out of bed and ambulation.  

## 2023-05-16 ENCOUNTER — Telehealth: Payer: Self-pay | Admitting: *Deleted

## 2023-05-16 NOTE — Telephone Encounter (Signed)
Attempted to call for post procedure follow-up. Messge left. 

## 2023-05-28 NOTE — Progress Notes (Unsigned)
PROVIDER NOTE: Information contained herein reflects review and annotations entered in association with encounter. Interpretation of such information and data should be left to medically-trained personnel. Information provided to patient can be located elsewhere in the medical record under "Patient Instructions". Document created using STT-dictation technology, any transcriptional errors that may result from process are unintentional.    Patient: Johnathan Harvey  Service Category: E/M  Provider: Oswaldo Done, MD  DOB: [birth year]  DOS: 05/29/2023  Referring Provider: Danella Penton, MD  MRN: [id number]  Specialty: Interventional Pain Management  PCP: Johnathan Penton, MD  Type: Established Patient  Setting: Ambulatory outpatient    Location: Office  Delivery: Face-to-face     HPI  Johnathan Harvey, a 67 y.o. year old male, is here today because of his No primary diagnosis found.. Johnathan Harvey's primary complain today is No chief complaint on file.  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); Cervical facet arthropathy (Multilevel) (Bilateral); DDD (degenerative disc disease), cervical; Cervical foraminal stenosis (Bilateral: C6-7) (Right: C4-5) (Left: C5-6); Cervicogenic headache (2ry area of Pain) (Right); Cervical facet syndrome; Spondylosis without myelopathy or radiculopathy, cervical region; Occipital headache (Right); Neck pain of over 3 months duration; Cervical neck pain with evidence of disc disease; Chronic neck and back pain; Musculoskeletal disorder involving upper trapezius muscle (Right); Cervical facet hypertrophy (Multilevel) (Bilateral); Postoperative  pain after spinal surgery; Neck pain with history of cervical spinal surgery; Spinal stenosis in cervical region; and Cervical facet joint pain on their pertinent problem list. Pain Assessment: Severity of   is reported as a  /10. Location:    / . Onset:  . Quality:  . Timing:  . Modifying factor(s):  Marland Kitchen Vitals:  vitals were not taken for this visit.  BMI: Estimated body mass index is 18.65 kg/m as calculated from the following:   Height as of 05/15/23: 5\' 10"  (1.778 m).   Weight as of 05/15/23: 130 lb (59 kg). Last encounter: 04/25/2023. Last procedure: 05/15/2023.  Reason for encounter: post-procedure evaluation and assessment. ***  Post-procedure evaluation   Procedure: Cervical Facet Medial Branch Block(s) #5  Laterality: Bilateral  Level: C6, C7, and C8 Medial Branch Level(s). Injecting these levels blocks the C6-7 and C7-T1 cervical facet joints.  Imaging: Fluoroscopic guidance Anesthesia: Local anesthesia (1-2% Lidocaine) Anxiolysis: IV Versed 3.0 mg Sedation: Moderate Sedation Fentanyl 1 mL (50 mcg) DOS: 05/15/2023  Performed by: Johnathan Done, MD  Purpose: Diagnostic/Therapeutic Indications: Cervicalgia (cervical spine axial pain) severe enough to impact quality of life or function. 1. Cervicalgia (1ry area of Pain)   2. Chronic neck and back pain   3. Cervical facet joint pain   4. Cervical facet syndrome   5. Cervical facet hypertrophy (Multilevel) (Bilateral)   6. Cervical facet arthropathy (Multilevel) (Bilateral)   7. Spondylosis without myelopathy or radiculopathy, cervical region   8. 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 DDD (degenerative disc disease), cervical   10. Abnormal MRI, cervical spine (09/28/2021)    NAS-11 Pain score:   Pre-procedure: 2 /10   Post-procedure: 0-No pain/10      Effectiveness:  Initial hour after procedure:   ***. Subsequent 4-6 hours post-procedure:   ***. Analgesia past initial 6 hours:   ***.  Ongoing  improvement:  Analgesic:  *** Function:    ***    ROM:    ***     Pharmacotherapy Assessment  Analgesic: No chronic opioid analgesics therapy prescribed by our practice.  Oxycodone/APAP 5/325 (# 20) 1 4 times daily (last filled on 08/31/2022) prescribed by Johnathan Gaster, MD  MME/day: 30 mg/day   Monitoring: [place] PMP: PDMP reviewed during this encounter.       Pharmacotherapy: No side-effects or adverse reactions reported. Compliance: No problems identified. Effectiveness: Clinically acceptable.  No notes on file  No results found for: "CBDTHCR" No results found for: "D8THCCBX" No results found for: "D9THCCBX"  UDS:  No results found for: "SUMMARY"    ROS  Constitutional: Denies any fever or chills Gastrointestinal: No reported hemesis, hematochezia, vomiting, or acute GI distress Musculoskeletal: Denies any acute onset joint swelling, redness, loss of ROM, or weakness Neurological: No reported episodes of acute onset apraxia, aphasia, dysarthria, agnosia, amnesia, paralysis, loss of coordination, or loss of consciousness  Medication Review  amLODipine, aspirin-acetaminophen-caffeine, cyclobenzaprine, gabapentin, hydrochlorothiazide, and omeprazole  History Review  Allergy: Johnathan Harvey is allergic to protonix [pantoprazole]. Drug: Johnathan Harvey  reports that he does not currently use drugs after having used the following drugs: Marijuana. Alcohol:  reports no history of alcohol use. Tobacco:  reports that he has quit smoking. His smoking use included cigarettes. He has never used smokeless tobacco. Social: Johnathan Harvey  reports that he has quit smoking. His smoking use included cigarettes. He has never used smokeless tobacco. He reports that he does not currently use drugs after having used the following drugs: Marijuana. He reports that he does not drink alcohol. Medical:  has a past medical history of Arthritis, Esophageal stricture, GERD (gastroesophageal reflux  disease), History of MRSA infection (2016), Hypertension, Migraine, and Skin cancer, basal cell. Surgical: Johnathan Harvey  has a past surgical history that includes Ankle arthroplasty; Esophagogastroduodenoscopy (N/A, 11/04/2018); and Inguinal hernia repair (Left, 09/26/2019). Family: family history includes Cancer in his father and mother.  Laboratory Chemistry Profile   Renal Lab Results  Component Value Date   BUN 21 03/17/2022   CREATININE 0.80 03/17/2022   GFRAA >60 10/10/2019   GFRNONAA >60 03/17/2022    Hepatic Lab Results  Component Value Date   AST 20 03/17/2022   ALT 13 03/17/2022   ALBUMIN 4.7 03/17/2022   ALKPHOS 62 03/17/2022   LIPASE 39 03/17/2022    Electrolytes Lab Results  Component Value Date   NA 137 03/17/2022   K 4.3 03/17/2022   CL 105 03/17/2022   CALCIUM 9.3 03/17/2022   MG 2.1 03/11/2021    Bone No results found for: "VD25OH", "VD125OH2TOT", "UY4034VQ2", "VZ5638VF6", "25OHVITD1", "25OHVITD2", "25OHVITD3", "TESTOFREE", "TESTOSTERONE"  Inflammation (CRP: Acute Phase) (ESR: Chronic Phase) Lab Results  Component Value Date   LATICACIDVEN 0.8 10/10/2019         Note: Above Lab results reviewed.  Recent Imaging Review  DG PAIN CLINIC C-ARM 1-60 MIN NO REPORT Fluoro was used, but no Radiologist interpretation will be provided.  Please refer to "NOTES" tab for provider progress note. Note: Reviewed        Physical Exam  General appearance: Well nourished, well developed, and well hydrated. In no apparent acute distress Mental status: Alert, oriented x 3 (person, place, & time)       Respiratory: No evidence of acute respiratory distress Eyes: PERLA Vitals: There were no vitals taken for this visit. BMI: Estimated body mass index is 18.65 kg/m  as calculated from the following:   Height as of 05/15/23: 5\' 10"  (1.778 m).   Weight as of 05/15/23: 130 lb (59 kg). Ideal: Ideal body weight: 73 kg (160 lb 15 oz)  Assessment   Diagnosis Status  No  diagnosis found. Controlled Controlled Controlled   Updated Problems: No problems updated.  Plan of Care  Problem-specific:  No problem-specific Assessment & Plan notes found for this encounter.  Johnathan Harvey has a current medication list which includes the following long-term medication(s): amlodipine, cyclobenzaprine, gabapentin, and hydrochlorothiazide.  Pharmacotherapy (Medications Ordered): No orders of the defined types were placed in this encounter.  Orders:  No orders of the defined types were placed in this encounter.  Follow-up plan:   No follow-ups on file.      Interventional Therapies  Risk  Complexity Considerations:   Note: Difficult IV stick.   Planned  Pending:      Under consideration:   Diagnostic left cervical facet C6-C8 RFA #1   Facet Joint Innervation  C1-2 Third occipital Nerve (TON)  C2-3 TON, C3  Medial Branch  C3-4 C3, C4         "          "  C4-5 C4, C5         "          "  C5-6 C5, C6         "          "  C6-7 C6, C7         "          "  C7-T1 C7, C8         "          "     Completed:   Therapeutic right C3-C8 cervical MB RFA x1 (11/23/2022) (100/100/100/97) Diagnostic bilateral cervical facet MBB x4 (10/19/2022) (100/100/35/35)  Diagnostic bilateral cervical facet C6-T1 MBB x1 (05/25/2022) (100/100/85/85)  Diagnostic right cervical facet C3-C5 MBB x1 (05/25/2022) (100/100/85/85)  Diagnostic right cervical facet C3-C7 MBB x1 (04/25/2022) (100/100/60/100) (100-Neck/60-H/A)  Diagnostic bilateral cervical facet C7 & T1 MBB x1 (04/25/2022) (100/100/60/100) (100-Neck/60-H/A)  Diagnostic right Cervical ESI x1 (03/23/2022) (DOS: 10-10/10) (0/0/0/0) (patient indicated having gone to the ED x4 since procedure.)   Completed by other providers:   Therapeutic right C3 and C4 medial branch RFA x1 (07/30/2020) by Merri Ray, DO [facility])  Diagnostic right C3 and C4 MBB x2 (06/30/2020, 07/15/2020) by Merri Ray, DO [facility])   Diagnostic right C3-4 zygapophyseal joint (facet joint) inj. x1 (03/05/2020) by Merri Ray, DO Therapeutic  ACDF C4/5, C5/6, C6/7 (07/04/2022) by Hoyt Koch, MD ([facility]) (80% improvement)    Therapeutic  Palliative (PRN) options:   None established   Pharmacotherapy:  Nonopioid transferred (10/02/2022): Gabapentin (Neurontin) 300 mg, 3 tabs p.o. at bedtime; Flexeril 10 mg, 1 tab p.o. at bedtime. I believe this regimen to be appropriate for this patient however, we no longer have the manpower to continue managing medications on these patients and therefore I have kindly request that for his PCP to consider taking over it.  The patient was informed that should they decide not to, we do not plan on continuing with this regimen because of the above reasons. Recommendations:   None at this time.        Recent Visits Date Type Provider Dept  05/15/23 Procedure visit Delano Metz, MD [facility]  04/25/23 Office Visit Delano Metz, MD  [facility]  Showing recent visits within past 90 days and meeting all other requirements Future Appointments Date Type Provider Dept  05/29/23 Appointment Delano Metz, MD [facility]  Showing future appointments within next 90 days and meeting all other requirements  I discussed the assessment and treatment plan with the patient. The patient was provided an opportunity to ask questions and all were answered. The patient agreed with the plan and demonstrated an understanding of the instructions.  Patient advised to call back or seek an in-person evaluation if the symptoms or condition worsens.  Duration of encounter: *** minutes.  Total time on encounter, as per AMA guidelines included both the face-to-face and non-face-to-face time personally spent by the physician and/or other qualified health care professional(s) on the day of the encounter (includes time in activities that require the physician or  other qualified health care professional and does not include time in activities normally performed by clinical staff). Physician's time may include the following activities when performed: Preparing to see the patient (e.g., pre-charting review of records, searching for previously ordered imaging, lab work, and nerve conduction tests) Review of prior analgesic pharmacotherapies. Reviewing PMP Interpreting ordered tests (e.g., lab work, imaging, nerve conduction tests) Performing post-procedure evaluations, including interpretation of diagnostic procedures Obtaining and/or reviewing separately obtained history Performing a medically appropriate examination and/or evaluation Counseling and educating the patient/family/caregiver Ordering medications, tests, or procedures Referring and communicating with other health care professionals (when not separately reported) Documenting clinical information in the electronic or other health record Independently interpreting results (not separately reported) and communicating results to the patient/ family/caregiver Care coordination (not separately reported)  Note by: Johnathan Done, MD Date: 05/29/2023; Time: 3:47 PM

## 2023-05-29 ENCOUNTER — Encounter: Payer: Self-pay | Admitting: Pain Medicine

## 2023-05-29 ENCOUNTER — Ambulatory Visit: Payer: No Typology Code available for payment source | Attending: Pain Medicine | Admitting: Pain Medicine

## 2023-05-29 VITALS — BP 125/84 | HR 59 | Temp 97.1°F | Resp 16 | Ht 70.0 in | Wt 130.0 lb

## 2023-05-29 DIAGNOSIS — G8929 Other chronic pain: Secondary | ICD-10-CM | POA: Diagnosis not present

## 2023-05-29 DIAGNOSIS — Z9889 Other specified postprocedural states: Secondary | ICD-10-CM

## 2023-05-29 DIAGNOSIS — Z09 Encounter for follow-up examination after completed treatment for conditions other than malignant neoplasm: Secondary | ICD-10-CM

## 2023-05-29 DIAGNOSIS — M549 Dorsalgia, unspecified: Secondary | ICD-10-CM | POA: Insufficient documentation

## 2023-05-29 DIAGNOSIS — M542 Cervicalgia: Secondary | ICD-10-CM | POA: Diagnosis not present

## 2023-05-29 DIAGNOSIS — G4486 Cervicogenic headache: Secondary | ICD-10-CM

## 2023-05-29 DIAGNOSIS — M47812 Spondylosis without myelopathy or radiculopathy, cervical region: Secondary | ICD-10-CM

## 2023-05-29 NOTE — Progress Notes (Signed)
Safety precautions to be maintained throughout the outpatient stay will include: orient to surroundings, keep bed in low position, maintain call bell within reach at all times, provide assistance with transfer out of bed and ambulation.  

## 2023-07-12 DIAGNOSIS — G894 Chronic pain syndrome: Secondary | ICD-10-CM | POA: Diagnosis not present

## 2023-07-12 DIAGNOSIS — I10 Essential (primary) hypertension: Secondary | ICD-10-CM | POA: Diagnosis not present

## 2023-07-12 DIAGNOSIS — Z2821 Immunization not carried out because of patient refusal: Secondary | ICD-10-CM | POA: Diagnosis not present

## 2023-07-12 DIAGNOSIS — Z23 Encounter for immunization: Secondary | ICD-10-CM | POA: Diagnosis not present

## 2023-09-19 ENCOUNTER — Other Ambulatory Visit: Payer: Self-pay | Admitting: Pain Medicine

## 2023-09-19 DIAGNOSIS — Z9889 Other specified postprocedural states: Secondary | ICD-10-CM

## 2023-09-19 DIAGNOSIS — M542 Cervicalgia: Secondary | ICD-10-CM

## 2023-09-19 DIAGNOSIS — G8918 Other acute postprocedural pain: Secondary | ICD-10-CM

## 2023-09-19 DIAGNOSIS — G4486 Cervicogenic headache: Secondary | ICD-10-CM

## 2023-09-20 DIAGNOSIS — L820 Inflamed seborrheic keratosis: Secondary | ICD-10-CM | POA: Diagnosis not present

## 2023-09-20 DIAGNOSIS — L570 Actinic keratosis: Secondary | ICD-10-CM | POA: Diagnosis not present

## 2023-09-20 DIAGNOSIS — D2261 Melanocytic nevi of right upper limb, including shoulder: Secondary | ICD-10-CM | POA: Diagnosis not present

## 2023-09-20 DIAGNOSIS — D2262 Melanocytic nevi of left upper limb, including shoulder: Secondary | ICD-10-CM | POA: Diagnosis not present

## 2023-09-20 DIAGNOSIS — L821 Other seborrheic keratosis: Secondary | ICD-10-CM | POA: Diagnosis not present

## 2023-09-20 DIAGNOSIS — L853 Xerosis cutis: Secondary | ICD-10-CM | POA: Diagnosis not present

## 2023-09-20 DIAGNOSIS — Z85828 Personal history of other malignant neoplasm of skin: Secondary | ICD-10-CM | POA: Diagnosis not present

## 2023-09-20 DIAGNOSIS — D225 Melanocytic nevi of trunk: Secondary | ICD-10-CM | POA: Diagnosis not present

## 2023-09-23 NOTE — Patient Instructions (Signed)

## 2023-09-23 NOTE — Progress Notes (Unsigned)
PROVIDER NOTE: Information contained herein reflects review and annotations entered in association with encounter. Interpretation of such information and data should be left to medically-trained personnel. Information provided to patient can be located elsewhere in the medical record under "Patient Instructions". Document created using STT-dictation technology, any transcriptional errors that may result from process are unintentional.    Patient: Johnathan Harvey  Service Category: E/M  Provider: Oswaldo Done, MD  DOB: [birth year]  DOS: 09/24/2023  Referring Provider: Danella Penton, MD  MRN: [id number]  Specialty: Interventional Pain Management  PCP: Danella Penton, MD  Type: Established Patient  Setting: Ambulatory outpatient    Location: Office  Delivery: Face-to-face     HPI  Johnathan Harvey, a 67 y.o. year old male, is here today because of his Cervicalgia [M54.2]. 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); Cervical facet arthropathy (Multilevel) (Bilateral); DDD (degenerative disc disease), cervical; Cervical foraminal stenosis (Bilateral: C6-7) (Right: C4-5) (Left: C5-6); Cervicogenic headache (2ry area of Pain) (Right); Cervical facet syndrome; Spondylosis without myelopathy or radiculopathy, cervical region; Occipital headache (Right); Neck pain of over 3 months duration; Cervical neck pain with evidence of disc disease; Chronic neck and back pain; Musculoskeletal disorder involving upper trapezius muscle (Right); Cervical facet hypertrophy (Multilevel) (Bilateral); Postoperative pain after spinal  surgery; Neck pain with history of cervical spinal surgery; Spinal stenosis in cervical region; and Cervical facet joint pain on their pertinent problem list. Pain Assessment: Severity of Chronic pain is reported as a 6 /10. Location: Neck Mid/Denies. Onset: More than a month ago. Quality: Aching. Timing: Constant. Modifying factor(s): Meds. Vitals:  height is 5\' 10"  (1.778 m) and weight is 130 lb (59 kg). His temperature is 98.4 F (36.9 C). His blood pressure is 133/81 and his pulse is 64. His oxygen saturation is 99%.  BMI: Estimated body mass index is 18.65 kg/m as calculated from the following:   Height as of this encounter: 5\' 10"  (1.778 m).   Weight as of this encounter: 130 lb (59 kg). Last encounter: 05/29/2023. Last procedure: 05/15/2023.  Reason for encounter: evaluation of worsening, or previously known (established) problem.  Discussed the use of AI scribe software for clinical note transcription with the patient, who gave verbal consent to proceed.  History of Present Illness   The patient, with a history of chronic neck pain, presents with persistent symptoms despite previous interventions. They report waking up every morning with a neck ache that subsequently triggers a headache. The pain is described as being located in the middle of the neck and is severe enough to disrupt sleep, with the patient only managing four to five hours of sleep per night. The headaches are also described as being centrally located at the back of the head.  The patient has previously undergone a bilateral cervical block, which initially provided complete relief. However, the pain relief was temporary, lasting about four days before the symptoms started to return. The patient has also undergone a cervical facet radiofrequency procedure on the right side, which provided significant relief for several weeks. However, the left side has not been treated.  The patient has been on Gabapentin, which they ran out  of five days prior  to the consultation. They are unsure of the medication's effectiveness, as they have not noticed a significant change in their symptoms since discontinuing the medication. The patient has expressed a desire to wait and assess their symptoms before deciding whether to continue with the Gabapentin.     Pharmacotherapy Assessment  Analgesic: No chronic opioid analgesics therapy prescribed by our practice.  Oxycodone/APAP 5/325 (# 20) 1 4 times daily (last filled on 08/31/2022) prescribed by Ander Gaster, MD  MME/day: 30 mg/day   Monitoring: [place] PMP: PDMP reviewed during this encounter.       Pharmacotherapy: No side-effects or adverse reactions reported. Compliance: No problems identified. Effectiveness: Clinically acceptable.  Brigitte Pulse, RN  09/24/2023  8:43 AM  Sign when Signing Visit Safety precautions to be maintained throughout the outpatient stay will include: orient to surroundings, keep bed in low position, maintain call bell within reach at all times, provide assistance with transfer out of bed and ambulation.     No results found for: "CBDTHCR" No results found for: "D8THCCBX" No results found for: "D9THCCBX"  UDS:  No results found for: "SUMMARY"    ROS  Constitutional: Denies any fever or chills Gastrointestinal: No reported hemesis, hematochezia, vomiting, or acute GI distress Musculoskeletal: Denies any acute onset joint swelling, redness, loss of ROM, or weakness Neurological: No reported episodes of acute onset apraxia, aphasia, dysarthria, agnosia, amnesia, paralysis, loss of coordination, or loss of consciousness  Medication Review  amLODipine, aspirin-acetaminophen-caffeine, cyclobenzaprine, gabapentin, hydrochlorothiazide, and omeprazole  History Review  Allergy: Johnathan Harvey is allergic to protonix [pantoprazole]. Drug: Johnathan Harvey  reports that he does not currently use drugs after having used the following drugs:  Marijuana. Alcohol:  reports no history of alcohol use. Tobacco:  reports that he has quit smoking. His smoking use included cigarettes. He has never used smokeless tobacco. Social: Johnathan Harvey  reports that he has quit smoking. His smoking use included cigarettes. He has never used smokeless tobacco. He reports that he does not currently use drugs after having used the following drugs: Marijuana. He reports that he does not drink alcohol. Medical:  has a past medical history of Arthritis, Esophageal stricture, GERD (gastroesophageal reflux disease), History of MRSA infection (2016), Hypertension, Migraine, and Skin cancer, basal cell. Surgical: Johnathan Harvey  has a past surgical history that includes Ankle arthroplasty; Esophagogastroduodenoscopy (N/A, 11/04/2018); and Inguinal hernia repair (Left, 09/26/2019). Family: family history includes Cancer in his father and mother.  Laboratory Chemistry Profile   Renal Lab Results  Component Value Date   BUN 21 03/17/2022   CREATININE 0.80 03/17/2022   GFRAA >60 10/10/2019   GFRNONAA >60 03/17/2022    Hepatic Lab Results  Component Value Date   AST 20 03/17/2022   ALT 13 03/17/2022   ALBUMIN 4.7 03/17/2022   ALKPHOS 62 03/17/2022   LIPASE 39 03/17/2022    Electrolytes Lab Results  Component Value Date   NA 137 03/17/2022   K 4.3 03/17/2022   CL 105 03/17/2022   CALCIUM 9.3 03/17/2022   MG 2.1 03/11/2021    Bone No results found for: "VD25OH", "VD125OH2TOT", "PP2951OA4", "ZY6063KZ6", "25OHVITD1", "25OHVITD2", "25OHVITD3", "TESTOFREE", "TESTOSTERONE"  Inflammation (CRP: Acute Phase) (ESR: Chronic Phase) Lab Results  Component Value Date   LATICACIDVEN 0.8 10/10/2019         Note: Above Lab results reviewed.  Recent Imaging Review  DG PAIN CLINIC C-ARM 1-60 MIN NO REPORT Fluoro was used, but no Radiologist interpretation will be provided.  Please refer to "  NOTES" tab for provider progress note. Note: Reviewed         Physical Exam  General appearance: Well nourished, well developed, and well hydrated. In no apparent acute distress Mental status: Alert, oriented x 3 (person, place, & time)       Respiratory: No evidence of acute respiratory distress Eyes: PERLA Vitals: BP 133/81   Pulse 64   Temp 98.4 F (36.9 C)   Ht 5\' 10"  (1.778 m)   Wt 130 lb (59 kg)   SpO2 99%   BMI 18.65 kg/m  BMI: Estimated body mass index is 18.65 kg/m as calculated from the following:   Height as of this encounter: 5\' 10"  (1.778 m).   Weight as of this encounter: 130 lb (59 kg). Ideal: Ideal body weight: 73 kg (160 lb 15 oz)  Assessment   Diagnosis Status  1. Cervicalgia (1ry area of Pain)   2. Neck pain of over 3 months duration   3. Cervical facet joint pain   4. Cervical facet syndrome   5. Cervical facet arthropathy (Multilevel) (Bilateral)   6. Cervical facet hypertrophy (Multilevel) (Bilateral)   7. 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 Chronic neck and back pain   9. Spondylosis without myelopathy or radiculopathy, cervical region   10. Cervicogenic headache (2ry area of Pain) (Right)   11. Abnormal MRI, cervical spine (09/28/2021)    Worsening Persistent Recurring   Updated Problems: Problem  Esophageal Stricture   Formatting of this note is different from the original. Etiology/CA: peptic Location: GE junction (39 cm) Notes: s/p fundoplication in 1980's   Date Start Finish Type Steroids Notes  05/11/14 10 13.5 TTS Balloon    05/27/14 8 15  TTS Balloon    07/24/14 10 15  TTS Balloon  Scope did not traverse  08/21/14 12 15  TTS Balloon Kenalog 40 mg/1 mL x 3 mL Scope did not traverse; Bx's  10/02/14 12 15  TTS Balloon  Scope traverse w mod resistance  11/13/14 12 15  TTS Balloon    12/25/14 12 15  TTS Balloon    02/19/15 11 17  [facility]  Scope did not traverse  06/18/15 11 17  [facility]ary  Scope did not traverse  08/06/15 12 15  TTS Balloon  Scope did not traverse; Bx's; restart  PPI  09/24/15 12.8 17 Sav[facility]  Scope traverse w mild resistance  12/13/15 12.8 18 Savary  Moderate resistance  01/31/16 12.8 18 Savary    05/11/16 12.8 18 [facility] Kenalog 40 mg/1 mL x 3 mL (consider bx at next EGD) Formatting of this note might be different from the original. 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) Notes: s/p fundoplication in 1980's  Date Start Finish Type Steroids Notes  05/11/14 10 13.5 TTS Balloon    05/27/14 8 15  TTS Balloon    07/24/14 10 15  TTS Balloon  Scope did not traverse  08/21/14 12 15  TTS Balloon Kenalog 40 mg/1 mL x 3 mL Scope did not traverse; Bx's  10/02/14 12 15  TTS Balloon  Scope traverse w mod resistance  11/13/14 12 15  TTS Balloon    12/25/14 12 15  TTS Balloon    02/19/15 11 17  [facility]  Scope did not traverse  06/18/15 11 17  [facility]  Scope did not traverse  08/06/15 12 15  TTS Balloon  Scope did not traverse; Bx's; restart PPI  09/24/15 12.8 17 [facility]  Scope traverse w mild resistance  12/13/15 12.8 18 [facility]  Moderate resistance  01/31/16 12.8 18 Savary    05/11/16 12.8 18 [facility] Kenalog 40 mg/1 mL x 3 mL (consider bx at next EGD)     Plan of Care  Problem-specific:  Assessment and Plan    Chronic Cervical Pain They exhibit chronic cervical pain with associated headaches, primarily in the middle of the neck and radiating to the back of the head. A previous bilateral cervical block provided immediate 100% pain relief and 97% relief for six weeks on the right side. An MRI from 2022 revealed severe bilateral foraminal narrowing at C6-7 and anterolisthesis at C7-T1, with current symptoms suggesting facet joint involvement. Despite being prescribed gabapentin, its efficacy remains uncertain. It was explained that gabapentin requires about three weeks to reach therapeutic levels and a similar timeframe to clear from the system. We will order radiofrequency ablation for C2-C3 and  C3-C4 levels bilaterally and discuss the continuation of gabapentin with their primary care physician. Monitoring for increased pain or symptoms indicative of gabapentin withdrawal over the next three weeks is essential. If insurance approval for the procedure is delayed, repeating the cervical facet radiofrequency or facet block will be considered.  Headaches Secondary to Cervical Pain Their headaches, secondary to chronic cervical pain, are primarily in the middle and back of the head, exacerbated by poor sleep due to pain. Managing the underlying cervical pain will alleviate the headaches. Sleep hygiene measures will be considered to improve sleep quality.  General Health Maintenance General health maintenance was not explicitly discussed during this visit. They should follow up with their primary care physician for routine health maintenance and screenings.  Follow-up A follow-up appointment after the radiofrequency ablation will be scheduled to assess efficacy. They will also follow up with their primary care physician regarding the gabapentin prescription.       Johnathan Harvey has a current medication list which includes the following long-term medication(s): amlodipine, hydrochlorothiazide, cyclobenzaprine, and gabapentin.  Pharmacotherapy (Medications Ordered): No orders of the defined types were placed in this encounter.  Orders:  Orders Placed This Encounter  Procedures   Radiofrequency,Cervical    Standing Status:   Future    Standing Expiration Date:   01/23/2024    Scheduling Instructions:     Side(s): Bilateral     Level(s): TON and C3 Medial Branch Nerves     Sedation: Patient's choice.     Scheduling Timeframe: As soon as pre-approved    Order Specific Question:   Where will this procedure be performed?    Answer:   [facility]   Follow-up plan:   Return for (ECT): (B) C3 + TON RFA #1.      Interventional Therapies  Risk  Complexity Considerations:    Note: Difficult IV stick.   Planned  Pending:   Diagnostic/therapeutic bilateral (C3 + TON) cervical facet RFA #1    Under consideration:   Diagnostic left cervical facet C6-C8 RFA #1   Facet Joint Innervation  C1-2 Third occipital Nerve (TON)  C2-3 TON, C3  Medial Branch  C3-4 C3, C4         "          "  C4-5 C4, C5         "          "  C5-6 C5, C6         "          "  C6-7 C6, C7         "          "  C7-T1 C7, C8         "          "     Completed:   Therapeutic right C3-C8 cervical MB RFA x1 (11/23/2022) (100/100/100/97) Diagnostic bilateral cervical facet MBB x4 (10/19/2022) (100/100/35/35)  Diagnostic bilateral cervical facet C6-T1 MBB x1 (05/25/2022) (100/100/85/85)  Diagnostic right cervical facet C3-C5 MBB x1 (05/25/2022) (100/100/85/85)  Diagnostic right cervical facet C3-C7 MBB x1 (04/25/2022) (100/100/60/100) (100-Neck/60-H/A)  Diagnostic bilateral cervical facet C7 & T1 MBB x1 (04/25/2022) (100/100/60/100) (100-Neck/60-H/A)  Diagnostic right Cervical ESI x1 (03/23/2022) (DOS: 10-10/10) (0/0/0/0) (patient indicated having gone to the ED x4 since procedure.)   Completed by other providers:   Therapeutic right C3 and C4 medial branch RFA x1 (07/30/2020) by Merri Ray, DO [facility])  Diagnostic right C3 and C4 MBB x2 (06/30/2020, 07/15/2020) by Merri Ray, DO [facility])  Diagnostic right C3-4 zygapophyseal joint (facet joint) inj. x1 (03/05/2020) by Merri Ray, DO Therapeutic  ACDF C4/5, C5/6, C6/7 (07/04/2022) by Hoyt Koch, MD ([facility]) (80% improvement)    Therapeutic  Palliative (PRN) options:   None established   Pharmacotherapy:  Nonopioid transferred (10/02/2022): Gabapentin (Neurontin) 300 mg, 3 tabs p.o. at bedtime; Flexeril 10 mg, 1 tab p.o. at bedtime. I believe this regimen to be appropriate for this patient however, we no longer have the manpower to continue managing medications on these patients and therefore I have kindly request that for  his PCP to consider taking over it.  The patient was informed that should they decide not to, we do not plan on continuing with this regimen because of the above reasons. Recommendations:   None at this time.      Recent Visits No visits were found meeting these conditions. Showing recent visits within past 90 days and meeting all other requirements Today's Visits Date Type Provider Dept  09/24/23 Office Visit Delano Metz, MD [facility]  Showing today's visits and meeting all other requirements Future Appointments No visits were found meeting these conditions. Showing future appointments within next 90 days and meeting all other requirements  I discussed the assessment and treatment plan with the patient. The patient was provided an opportunity to ask questions and all were answered. The patient agreed with the plan and demonstrated an understanding of the instructions.  Patient advised to call back or seek an in-person evaluation if the symptoms or condition worsens.  Duration of encounter: 30 minutes.  Total time on encounter, as per AMA guidelines included both the face-to-face and non-face-to-face time personally spent by the physician and/or other qualified health care professional(s) on the day of the encounter (includes time in activities that require the physician or other qualified health care professional and does not include time in activities normally performed by clinical staff). Physician's time may include the following activities when performed: Preparing to see the patient (e.g., pre-charting review of records, searching for previously ordered imaging, lab work, and nerve conduction tests) Review of prior analgesic pharmacotherapies. Reviewing PMP Interpreting ordered tests (e.g., lab work, imaging, nerve conduction tests) Performing post-procedure evaluations, including interpretation of diagnostic procedures Obtaining and/or reviewing separately  obtained history Performing a medically appropriate examination and/or evaluation Counseling and educating the patient/family/caregiver Ordering medications, tests, or procedures Referring and communicating with other health care professionals (when not separately reported) Documenting clinical information in the electronic or other health record Independently interpreting results (not separately reported) and communicating results to the patient/ family/caregiver Care coordination (not separately reported)  Note  by: Oswaldo Done, MD Date: 09/24/2023; Time: 11:54 AM

## 2023-09-24 ENCOUNTER — Ambulatory Visit: Payer: No Typology Code available for payment source | Attending: Pain Medicine | Admitting: Pain Medicine

## 2023-09-24 ENCOUNTER — Encounter: Payer: Self-pay | Admitting: Pain Medicine

## 2023-09-24 VITALS — BP 133/81 | HR 64 | Temp 98.4°F | Ht 70.0 in | Wt 130.0 lb

## 2023-09-24 DIAGNOSIS — M4312 Spondylolisthesis, cervical region: Secondary | ICD-10-CM | POA: Diagnosis not present

## 2023-09-24 DIAGNOSIS — R937 Abnormal findings on diagnostic imaging of other parts of musculoskeletal system: Secondary | ICD-10-CM | POA: Diagnosis not present

## 2023-09-24 DIAGNOSIS — G4486 Cervicogenic headache: Secondary | ICD-10-CM | POA: Diagnosis not present

## 2023-09-24 DIAGNOSIS — M549 Dorsalgia, unspecified: Secondary | ICD-10-CM | POA: Insufficient documentation

## 2023-09-24 DIAGNOSIS — M542 Cervicalgia: Secondary | ICD-10-CM | POA: Insufficient documentation

## 2023-09-24 DIAGNOSIS — G8929 Other chronic pain: Secondary | ICD-10-CM | POA: Insufficient documentation

## 2023-09-24 DIAGNOSIS — M47812 Spondylosis without myelopathy or radiculopathy, cervical region: Secondary | ICD-10-CM | POA: Diagnosis not present

## 2023-09-24 NOTE — Progress Notes (Signed)
Safety precautions to be maintained throughout the outpatient stay will include: orient to surroundings, keep bed in low position, maintain call bell within reach at all times, provide assistance with transfer out of bed and ambulation.  

## 2023-10-03 NOTE — Progress Notes (Unsigned)
PROVIDER NOTE: Interpretation of information contained herein should be left to medically-trained personnel. Specific patient instructions are provided elsewhere under "Patient Instructions" section of medical record. This document was created in part using STT-dictation technology, any transcriptional errors that may result from this process are unintentional.  Patient: Johnathan Harvey Type: Established DOB: 30-May-1956 MRN: [id number] PCP: Johnathan Penton, MD  Service: Procedure DOS: 10/04/2023 Setting: Ambulatory Location: Ambulatory outpatient facility Delivery: Face-to-face Provider: Oswaldo Done, MD Specialty: Interventional Pain Management Specialty designation: 09 Location: Outpatient facility Ref. Prov.: Johnathan Metz, MD       Interventional Therapy   Primary Reason for Visit: Interventional Pain Management Treatment. CC: No chief complaint on file.    Procedure:          Anesthesia, Analgesia, Anxiolysis:  Type: Cervical C2 Medial Branch + Third Occipital Nerve (TON) Radiofrequency Ablation  #1  Primary Purpose: Diagnostic Region: Posterolateral cervical spine region Level: C2 & C3 Medial Branch Level(s). Lesioning of these levels should completely denervate the occipital nerve. Laterality:  ***  Paraspinal  Anesthesia: Local (1-2% Lidocaine)  Anxiolysis: None  Sedation: None  Guidance: Fluoroscopy           Position: Prone   Indications: 1. Cervicogenic headache (2ry area of Pain) (Right)   2. Chronic tension-type headache, intractable   3. Headache disorder   4. Cervicalgia (1ry area of Pain)   5. Cervical facet joint pain   6. Cervical facet syndrome   7. Cervical facet hypertrophy (Multilevel) (Bilateral)   8. Cervical facet arthropathy (Multilevel) (Bilateral)   9. Cervical neck pain with evidence of disc disease   10. Chronic neck and back pain   11. 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 Abnormal MRI, cervical spine  (09/28/2021)    Johnathan Harvey has been dealing with the above chronic pain for longer than three months and has either failed to respond, was unable to tolerate, or simply did not get enough benefit from other more conservative therapies including, but not limited to: 1. Over-the-counter medications 2. Anti-inflammatory medications 3. Muscle relaxants 4. Membrane stabilizers 5. Opioids 6. Physical therapy and/or chiropractic manipulation 7. Modalities (Heat, ice, etc.) 8. Invasive techniques such as nerve blocks. Johnathan Harvey has attained more than 50% relief of the pain from a series of diagnostic injections conducted in separate occasions.  Pain Score: Pre-procedure:  /10 Post-procedure:  /10     H&P (Pre-op Assessment):  Johnathan Harvey is a 67 y.o. (year old), male patient, seen today for interventional treatment. He  has a past surgical history that includes Ankle arthroplasty; Esophagogastroduodenoscopy (N/A, 11/04/2018); and Inguinal hernia repair (Left, 09/26/2019). Johnathan Harvey has a current medication list which includes the following prescription(s): amlodipine, aspirin-acetaminophen-caffeine, cyclobenzaprine, gabapentin, hydrochlorothiazide, and omeprazole. His primarily concern today is the No chief complaint on file.  Initial Vital Signs:  Pulse/HCG Rate:    Temp:   Resp:   BP:   SpO2:    BMI: Estimated body mass index is 18.65 kg/m as calculated from the following:   Height as of 09/24/23: 5\' 10"  (1.778 m).   Weight as of 09/24/23: 130 lb (59 kg).  Risk Assessment: Allergies: Reviewed. He is allergic to protonix [pantoprazole].  Allergy Precautions: None required Coagulopathies: Reviewed. None identified.  Blood-thinner therapy: None at this time Active Infection(s): Reviewed. None identified. Johnathan Harvey is afebrile  Site Confirmation: Johnathan Harvey was asked to confirm the procedure and laterality before marking the site Procedure checklist:  Completed  Consent: Before the procedure and under the influence of no sedative(s), amnesic(s), or anxiolytics, the patient was informed of the treatment options, risks and possible complications. To fulfill our ethical and legal obligations, as recommended by the American Medical Association's Code of Ethics, I have informed the patient of my clinical impression; the nature and purpose of the treatment or procedure; the risks, benefits, and possible complications of the intervention; the alternatives, including doing nothing; the risk(s) and benefit(s) of the alternative treatment(s) or procedure(s); and the risk(s) and benefit(s) of doing nothing. The patient was provided information about the general risks and possible complications associated with the procedure. These may include, but are not limited to: failure to achieve desired goals, infection, bleeding, organ or nerve damage, allergic reactions, paralysis, and death. In addition, the patient was informed of those risks and complications associated to Spine-related procedures, such as failure to decrease pain; infection (i.e.: Meningitis, epidural or intraspinal abscess); bleeding (i.e.: epidural hematoma, subarachnoid hemorrhage, or any other type of intraspinal or peri-dural bleeding); organ or nerve damage (i.e.: Any type of peripheral nerve, nerve root, or spinal cord injury) with subsequent damage to sensory, motor, and/or autonomic systems, resulting in permanent pain, numbness, and/or weakness of one or several areas of the body; allergic reactions; (i.e.: anaphylactic reaction); and/or death. Furthermore, the patient was informed of those risks and complications associated with the medications. These include, but are not limited to: allergic reactions (i.e.: anaphylactic or anaphylactoid reaction(s)); adrenal axis suppression; blood sugar elevation that in diabetics may result in ketoacidosis or comma; water retention that in patients with history  of congestive heart failure may result in shortness of breath, pulmonary edema, and decompensation with resultant heart failure; weight gain; swelling or edema; medication-induced neural toxicity; particulate matter embolism and blood vessel occlusion with resultant organ, and/or nervous system infarction; and/or aseptic necrosis of one or more joints. Finally, the patient was informed that Medicine is not an exact science; therefore, there is also the possibility of unforeseen or unpredictable risks and/or possible complications that may result in a catastrophic outcome. The patient indicated having understood very clearly. We have given the patient no guarantees and we have made no promises. Enough time was given to the patient to ask questions, all of which were answered to the patient's satisfaction. Mr. Dafoe has indicated that he wanted to continue with the procedure. Attestation: I, the ordering provider, attest that I have discussed with the patient the benefits, risks, side-effects, alternatives, likelihood of achieving goals, and potential problems during recovery for the procedure that I have provided informed consent. Date  Time: {CHL [facility]-PAIN TIME CHOICES:[id number]}   Pre-Procedure Preparation:  Monitoring: As per clinic protocol. Respiration, ETCO2, SpO2, BP, heart rate and rhythm monitor placed and checked for adequate function Safety Precautions: Patient was assessed for positional comfort and pressure points before starting the procedure. Time-out: I initiated and conducted the "Time-out" before starting the procedure, as per protocol. The patient was asked to participate by confirming the accuracy of the "Time Out" information. Verification of the correct person, site, and procedure were performed and confirmed by me, the nursing staff, and the patient. "Time-out" conducted as per Joint Commission's Universal Protocol (UP.01.01.01). Time:   Start Time:   hrs.  Description of  Procedure:          Laterality:  ***  Level: C3, C4, C5, C6, & C7 Medial Branch Level(s). Area Prepped: Entire Posterior Cervico-thoracic Region ChloraPrep (2% chlorhexidine gluconate and 70% isopropyl alcohol) Safety Precautions: Aspiration  looking for blood return was conducted prior to all injections. At no point did we inject any substances, as a needle was being advanced. Before injecting, the patient was told to immediately notify me if he was experiencing any new onset of "ringing in the ears, or metallic taste in the mouth". No attempts were made at seeking any paresthesias. Safe injection practices and needle disposal techniques used. Medications properly checked for expiration dates. SDV (single dose vial) medications used. After the completion of the procedure, all disposable equipment used was discarded in the proper designated medical waste containers. Local Anesthesia: Protocol guidelines were followed. The patient was positioned over the fluoroscopy table. The area was prepped in the usual manner. The time-out was completed. The target area was identified using fluoroscopy. A 12-in long, straight, sterile hemostat was used with fluoroscopic guidance to locate the targets for each level blocked. Once located, the skin was marked with an approved surgical skin marker. Once all sites were marked, the skin (epidermis, dermis, and hypodermis), as well as deeper tissues (fat, connective tissue and muscle) were infiltrated with a small amount of a short-acting local anesthetic, loaded on a 10cc syringe with a 25G, 1.5-in  Needle. An appropriate amount of time was allowed for local anesthetics to take effect before proceeding to the next step. Local Anesthetic: Lidocaine 2.0% The unused portion of the local anesthetic was discarded in the proper designated containers. Technical explanation of process:  Radiofrequency Ablation (RFA) C2 Medial Branch Nerve RFA: The target area for the C2 dorsal medial  articular branch is the lateral concave waist of the articular pillar of C2. Under fluoroscopic guidance, a Radiofrequency needle was inserted until contact was made with os over the postero-lateral aspect of the articular pillar of C2 (target area). Sensory and motor testing was conducted to properly adjust the position of the needle. Once satisfactory placement of the needle was achieved, the numbing solution was slowly injected after negative aspiration for blood. 2.0 mL of the nerve block solution was injected without difficulty or complication. After waiting for at least 3 minutes, the ablation was performed. Once completed, the needle was removed intact. C3 Medial Branch Nerve RFA: The target area for the C3 dorsal medial articular branch is the lateral concave waist of the articular pillar of C3. Under fluoroscopic guidance, a Radiofrequency needle was inserted until contact was made with os over the postero-lateral aspect of the articular pillar of C3 (target area). Sensory and motor testing was conducted to properly adjust the position of the needle. Once satisfactory placement of the needle was achieved, the numbing solution was slowly injected after negative aspiration for blood. 2.0 mL of the nerve block solution was injected without difficulty or complication. After waiting for at least 3 minutes, the ablation was performed. Once completed, the needle was removed intact. Radiofrequency lesioning (ablation):  Radiofrequency Generator: Medtronic AccurianTM AG 1000 RF Generator Sensory Stimulation Parameters: 50 Hz was used to locate & identify the nerve, making sure that the needle was positioned such that there was no sensory stimulation below 0.3 V or above 0.7 V. Motor Stimulation Parameters: 2 Hz was used to evaluate the motor component. Care was taken not to lesion any nerves that demonstrated motor stimulation of the lower extremities at an output of less than 2.5 times that of the sensory  threshold, or a maximum of 2.0 V. Lesioning Technique Parameters: Standard Radiofrequency settings. (Not bipolar or pulsed.) Temperature Settings: 80 degrees C Lesioning time: 60 seconds Stationary intra-operative compliance:  Compliant Materials & Medications: Needle(s) (Electrode/Cannula) Type: Teflon-coated, curved tip, Radiofrequency needle(s) Gauge: 22G Length: 10cm Numbing solution: 0.2% PF-Ropivacaine + Triamcinolone (40 mg/mL) diluted to a final concentration of 4 mg of Triamcinolone/mL of Ropivacaine The unused portion of the solution was discarded in the proper designated containers.  Once the entire procedure was completed, the treated area was cleaned, making sure to leave some of the prepping solution back to take advantage of its long term bactericidal properties.    Intra-operative Compliance: Compliant  There were no vitals filed for this visit.  Start Time:   hrs. End Time:   hrs.  Imaging Guidance (Spinal):          Type of Imaging Technique: Fluoroscopy Guidance (Spinal) Indication(s): Fluoroscopy guidance for needle placement to enhance accuracy in procedures requiring precise needle localization for targeted delivery of medication in or near specific anatomical locations not easily accessible without such real-time imaging assistance. Exposure Time: Please see nurses notes. Contrast: None used. Fluoroscopic Guidance: I was personally present during the use of fluoroscopy. "Tunnel Vision Technique" used to obtain the best possible view of the target area. Parallax error corrected before commencing the procedure. "Direction-depth-direction" technique used to introduce the needle under continuous pulsed fluoroscopy. Once target was reached, antero-posterior, oblique, and lateral fluoroscopic projection used confirm needle placement in all planes. Images permanently stored in EMR. Interpretation: No contrast injected. I personally interpreted the imaging intraoperatively.  Adequate needle placement confirmed in multiple planes. Permanent images saved into the patient's record.  Antibiotic Prophylaxis:   Anti-infectives (From admission, onward)    None      Indication(s): None identified  Post-operative Assessment:  Post-procedure Vital Signs:  Pulse/HCG Rate:    Temp:   Resp:   BP:   SpO2:    EBL: None  Complications: No immediate post-treatment complications observed by team, or reported by patient.  Note: The patient tolerated the entire procedure well. A repeat set of vitals were taken after the procedure and the patient was kept under observation following institutional policy, for this type of procedure. Post-procedural neurological assessment was performed, showing return to baseline, prior to discharge. The patient was provided with post-procedure discharge instructions, including a section on how to identify potential problems. Should any problems arise concerning this procedure, the patient was given instructions to immediately contact us, at any time, without hesitation. In any case, we plan to contact the patient by telephone for a follow-up status report regarding this interventional procedure.  Comments:  No additional relevant information.  Plan of Care (POC)  Orders:  No orders of the defined types were placed in this encounter.  Chronic Opioid Analgesic:   No chronic opioid analgesics therapy prescribed by our practice.  Oxycodone/APAP 5/325 (# 20) 1 4 times daily (last filled on 08/31/2022) prescribed by Ander Gaster, MD  MME/day: 30 mg/day   Medications ordered for procedure: No orders of the defined types were placed in this encounter.  Medications administered: Marguarite Arbour. Arciga had no medications administered during this visit.  See the medical record for exact dosing, route, and time of administration.  Follow-up plan:   No follow-ups on file.       Interventional Therapies  Risk  Complexity  Considerations:   Note: Difficult IV stick.   Planned  Pending:   Diagnostic/therapeutic bilateral (C3 + TON) cervical facet RFA #1    Under consideration:   Diagnostic left cervical facet C6-C8 RFA #1   Facet Joint Innervation  C1-2 Third occipital  Nerve (TON)  C2-3 TON, C3  Medial Branch  C3-4 C3, C4         "          "  C4-5 C4, C5         "          "  C5-6 C5, C6         "          "  C6-7 C6, C7         "          "  C7-T1 C7, C8         "          "     Completed:   Therapeutic right C3-C8 cervical MB RFA x1 (11/23/2022) (100/100/100/97) Diagnostic bilateral cervical facet MBB x4 (10/19/2022) (100/100/35/35)  Diagnostic bilateral cervical facet C6-T1 MBB x1 (05/25/2022) (100/100/85/85)  Diagnostic right cervical facet C3-C5 MBB x1 (05/25/2022) (100/100/85/85)  Diagnostic right cervical facet C3-C7 MBB x1 (04/25/2022) (100/100/60/100) (100-Neck/60-H/A)  Diagnostic bilateral cervical facet C7 & T1 MBB x1 (04/25/2022) (100/100/60/100) (100-Neck/60-H/A)  Diagnostic right Cervical ESI x1 (03/23/2022) (DOS: 10-10/10) (0/0/0/0) (patient indicated having gone to the ED x4 since procedure.)   Completed by other providers:   Therapeutic right C3 and C4 medial branch RFA x1 (07/30/2020) by Merri Ray, DO [facility] PMR)  Diagnostic right C3 and C4 MBB x2 (06/30/2020, 07/15/2020) by Merri Ray, DO [facility] PMR)  Diagnostic right C3-4 zygapophyseal joint (facet joint) inj. x1 (03/05/2020) by Merri Ray, DO Therapeutic  ACDF C4/5, C5/6, C6/7 (07/04/2022) by Hoyt Koch, MD ([facility]) (80% improvement)    Therapeutic  Palliative (PRN) options:   None established   Pharmacotherapy:  Nonopioid transferred (10/02/2022): Gabapentin (Neurontin) 300 mg, 3 tabs p.o. at bedtime; Flexeril 10 mg, 1 tab p.o. at bedtime. I believe this regimen to be appropriate for this patient however, we no longer have the manpower to continue managing medications on these patients and therefore I have kindly  request that for his PCP to consider taking over it.  The patient was informed that should they decide not to, we do not plan on continuing with this regimen because of the above reasons. Recommendations:   None at this time.      Recent Visits Date Type Provider Dept  09/24/23 Office Visit Johnathan Metz, MD [facility]  Showing recent visits within past 90 days and meeting all other requirements Future Appointments Date Type Provider Dept  10/04/23 Appointment Johnathan Metz, MD [facility]  Showing future appointments within next 90 days and meeting all other requirements  Disposition: Discharge home  Discharge (Date  Time): 10/04/2023;   hrs.   Primary Care Physician: Johnathan Penton, MD Location: [facility] Note by: Johnathan Done, MD (TTS technology used. I apologize for any typographical errors that were not detected and corrected.) Date: 10/04/2023; Time: 12:23 PM  Disclaimer:  Medicine is not an Visual merchandiser. The only guarantee in medicine is that nothing is guaranteed. It is important to note that the decision to proceed with this intervention was based on the information collected from the patient. The Data and conclusions were drawn from the patient's questionnaire, the interview, and the physical examination. Because the information was provided in large part by the patient, it cannot be guaranteed that it has not been purposely or unconsciously manipulated. Every effort has been made to obtain as much relevant data as possible  for this evaluation. It is important to note that the conclusions that lead to this procedure are derived in large part from the available data. Always take into account that the treatment will also be dependent on availability of resources and existing treatment guidelines, considered by other Pain Management Practitioners as being common knowledge and practice, at the time of the  intervention. For Medico-Legal purposes, it is also important to point out that variation in procedural techniques and pharmacological choices are the acceptable norm. The indications, contraindications, technique, and results of the above procedure should only be interpreted and judged by a Board-Certified Interventional Pain Specialist with extensive familiarity and expertise in the same exact procedure and technique.

## 2023-10-03 NOTE — Patient Instructions (Signed)

## 2023-10-04 ENCOUNTER — Ambulatory Visit: Payer: No Typology Code available for payment source | Attending: Pain Medicine | Admitting: Pain Medicine

## 2023-10-04 ENCOUNTER — Ambulatory Visit
Admission: RE | Admit: 2023-10-04 | Discharge: 2023-10-04 | Disposition: A | Discharge status: Home / Self Care | Payer: No Typology Code available for payment source | Source: Ambulatory Visit | Attending: Pain Medicine | Admitting: Pain Medicine

## 2023-10-04 ENCOUNTER — Encounter: Payer: Self-pay | Admitting: Pain Medicine

## 2023-10-04 VITALS — BP 116/85 | Temp 97.4°F | Resp 23 | Ht 70.0 in | Wt 130.0 lb

## 2023-10-04 DIAGNOSIS — M4312 Spondylolisthesis, cervical region: Secondary | ICD-10-CM | POA: Diagnosis not present

## 2023-10-04 DIAGNOSIS — M5090 Cervical disc disorder, unspecified, unspecified cervical region: Secondary | ICD-10-CM | POA: Insufficient documentation

## 2023-10-04 DIAGNOSIS — M542 Cervicalgia: Secondary | ICD-10-CM | POA: Insufficient documentation

## 2023-10-04 DIAGNOSIS — Z5189 Encounter for other specified aftercare: Secondary | ICD-10-CM | POA: Insufficient documentation

## 2023-10-04 DIAGNOSIS — M549 Dorsalgia, unspecified: Secondary | ICD-10-CM | POA: Diagnosis not present

## 2023-10-04 DIAGNOSIS — R519 Headache, unspecified: Secondary | ICD-10-CM | POA: Diagnosis present

## 2023-10-04 DIAGNOSIS — G8918 Other acute postprocedural pain: Secondary | ICD-10-CM | POA: Diagnosis not present

## 2023-10-04 DIAGNOSIS — G44221 Chronic tension-type headache, intractable: Secondary | ICD-10-CM | POA: Diagnosis not present

## 2023-10-04 DIAGNOSIS — R937 Abnormal findings on diagnostic imaging of other parts of musculoskeletal system: Secondary | ICD-10-CM | POA: Insufficient documentation

## 2023-10-04 DIAGNOSIS — G4486 Cervicogenic headache: Secondary | ICD-10-CM | POA: Insufficient documentation

## 2023-10-04 DIAGNOSIS — G8929 Other chronic pain: Secondary | ICD-10-CM | POA: Diagnosis not present

## 2023-10-04 DIAGNOSIS — M47812 Spondylosis without myelopathy or radiculopathy, cervical region: Secondary | ICD-10-CM | POA: Diagnosis not present

## 2023-10-04 MED ORDER — LIDOCAINE HCL 2 % IJ SOLN
INTRAMUSCULAR | Status: AC
  Filled 2023-10-04: qty 20

## 2023-10-04 MED ORDER — HYDROCODONE-ACETAMINOPHEN 5-325 MG PO TABS: 1.0000 | ORAL_TABLET | Freq: Three times a day (TID) | ORAL | 0 refills | Status: DC | PRN

## 2023-10-04 MED ORDER — ROPIVACAINE HCL 2 MG/ML IJ SOLN
INTRAMUSCULAR | Status: AC
  Filled 2023-10-04: qty 20

## 2023-10-04 MED ORDER — FENTANYL CITRATE (PF) 100 MCG/2ML IJ SOLN
INTRAMUSCULAR | Status: AC
  Filled 2023-10-04: qty 2

## 2023-10-04 MED ORDER — MIDAZOLAM HCL 5 MG/5ML IJ SOLN
0.5000 mg | Freq: Once | INTRAMUSCULAR | Status: AC
  Administered 2023-10-04: 2 mg via INTRAVENOUS

## 2023-10-04 MED ORDER — ROPIVACAINE HCL 2 MG/ML IJ SOLN
9.0000 mL | Freq: Once | INTRAMUSCULAR | Status: AC
  Administered 2023-10-04: 9 mL via PERINEURAL

## 2023-10-04 MED ORDER — DEXAMETHASONE SODIUM PHOSPHATE 10 MG/ML IJ SOLN
INTRAMUSCULAR | Status: AC
  Filled 2023-10-04: qty 1

## 2023-10-04 MED ORDER — LIDOCAINE HCL 2 % IJ SOLN
20.0000 mL | Freq: Once | INTRAMUSCULAR | Status: AC
Start: 2023-10-04 — End: 2023-10-04
  Administered 2023-10-04: 400 mg

## 2023-10-04 MED ORDER — MIDAZOLAM HCL 5 MG/5ML IJ SOLN
INTRAMUSCULAR | Status: AC
  Filled 2023-10-04: qty 5

## 2023-10-04 MED ORDER — LACTATED RINGERS IV SOLN: Freq: Once | INTRAVENOUS | Status: AC

## 2023-10-04 MED ORDER — PENTAFLUOROPROP-TETRAFLUOROETH EX AERO
INHALATION_SPRAY | Freq: Once | CUTANEOUS | Status: AC
  Administered 2023-10-04: 30 via TOPICAL

## 2023-10-04 MED ORDER — DEXAMETHASONE SODIUM PHOSPHATE 10 MG/ML IJ SOLN
10.0000 mg | Freq: Once | INTRAMUSCULAR | Status: AC
  Administered 2023-10-04: 10 mg

## 2023-10-04 MED ORDER — FENTANYL CITRATE (PF) 100 MCG/2ML IJ SOLN
25.0000 ug | INTRAMUSCULAR | Status: DC | PRN
  Administered 2023-10-04: 100 ug via INTRAVENOUS

## 2023-10-04 NOTE — Progress Notes (Signed)
Safety precautions to be maintained throughout the outpatient stay will include: orient to surroundings, keep bed in low position, maintain call bell within reach at all times, provide assistance with transfer out of bed and ambulation.  

## 2023-10-05 ENCOUNTER — Telehealth: Payer: Self-pay | Admitting: *Deleted

## 2023-10-05 NOTE — Telephone Encounter (Signed)
Post procedure call; voicemail full

## 2023-10-22 NOTE — Progress Notes (Signed)
 PROVIDER NOTE: Information contained herein reflects review and annotations entered in association with encounter. Interpretation of such information and data should be left to medically-trained personnel. Information provided to patient can be located elsewhere in the medical record under Patient Instructions. Document created using STT-dictation technology, any transcriptional errors that may result from process are unintentional.    Patient: Johnathan Harvey  Service Category: E/M  Provider: Eric DELENA Como, MD  DOB: [birth year]  DOS: 10/23/2023  Referring Provider: Cleotilde Oneil FALCON, MD  MRN: [id number]  Specialty: Interventional Pain Management  PCP: Cleotilde Oneil FALCON, MD  Type: Established Patient  Setting: Ambulatory outpatient    Location: Office  Delivery: Face-to-face     HPI  Johnathan Harvey, a 68 y.o. year old male, is here today because of his Acute postoperative pain [G89.18]. 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); Cervical facet arthropathy (Multilevel) (Bilateral); DDD (degenerative disc disease), cervical; Cervical foraminal stenosis (Bilateral: C6-7) (Right: C4-5) (Left: C5-6); Cervicogenic headache (2ry area of Pain) (Right); Cervical facet syndrome; Spondylosis without myelopathy or radiculopathy, cervical region; Occipital headache (Right); Neck pain of over 3 months duration; Cervical neck pain with evidence of disc disease; Chronic neck and back pain; Musculoskeletal disorder involving upper trapezius muscle (Right); Cervical facet hypertrophy (Multilevel) (Bilateral); Postoperative pain after  spinal surgery; Neck pain with history of cervical spinal surgery; Spinal stenosis in cervical region; and Cervical facet joint pain on their pertinent problem list. Pain Assessment: Severity of Chronic pain is reported as a 4 /10. Location: Neck  /denies. Onset: More than a month ago. Quality: Sharp. Timing: Intermittent. Modifying factor(s): Excedrin, pain pill. Vitals:  height is 5' 10 (1.778 m) and weight is 130 lb (59 kg). His temporal temperature is 97.5 F (36.4 C) (abnormal). His blood pressure is 136/80 and his pulse is 71. His respiration is 16 and oxygen saturation is 100%.  BMI: Estimated body mass index is 18.65 kg/m as calculated from the following:   Height as of this encounter: 5' 10 (1.778 m).   Weight as of this encounter: 130 lb (59 kg). Last encounter: 09/24/2023. Last procedure: 10/04/2023.  Reason for encounter: post-procedure evaluation and assessment.   Discussed the use of AI scribe software for clinical note transcription with the patient, who gave verbal consent to proceed.  History of Present Illness   The patient underwent a radiofrequency procedure on the 19th of the previous month for chronic pain management. He reports that the pain has not improved and, in fact, seems to have worsened since the procedure. The pain is so severe in the mornings that it causes nausea, although he does not vomit. The patient also reports that the pain wakes him up after two or three hours of sleep, forcing him to get up for an hour before he can return to bed. He also notes that the pain seems to be located in the middle of his back, which is a new development.  The patient has been taking prescribed pain medication, having already gone through two prescriptions since the procedure. He also reports having taken a medication for nausea in the past that seemed to help, and he requests a new prescription for this.  The  patient was previously on gabapentin  and Flexeril , but these were  transferred to his primary care physician's care. The patient reports that he has not been taking these medications as he believed they were no longer needed.      Post-procedure evaluation    Procedure:          Anesthesia, Analgesia, Anxiolysis:  Type: Cervical C3 Medial Branch + Third Occipital Nerve (TON) Radiofrequency Ablation  #1  Primary Purpose: Diagnostic Region: Posterolateral cervical spine region Level: C3 Medial Branch Level(s). Lesioning of these levels should completely denervate the occipital nerve. Laterality:  Bilateral  Paraspinal  Anesthesia: Local (1-2% Lidocaine )  Anxiolysis: IV Versed  2mg   Sedation: Moderate Fentanyl  2cc Guidance: Fluoroscopy Spinal ([id number])   Position: Prone   Indications: 1. Cervicogenic headache (2ry area of Pain) (Right)   2. Chronic tension-type headache, intractable   3. Headache disorder   4. Cervicalgia (1ry area of Pain)   5. Cervical facet joint pain   6. Cervical facet syndrome   7. Cervical facet hypertrophy (Multilevel) (Bilateral)   8. Cervical facet arthropathy (Multilevel) (Bilateral)   9. Cervical neck pain with evidence of disc disease   10. Chronic neck and back pain   11. 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 Abnormal MRI, cervical spine (09/28/2021)    Pain Score: Pre-procedure: 2 /10 Post-procedure: 0-No pain/10    Effectiveness:  Initial hour after procedure: 100 %. Subsequent 4-6 hours post-procedure: 100 %. Analgesia past initial 6 hours: 0 %. Ongoing improvement:  Analgesic: The patient returns to the clinic today after having had the second week of pain medication run out.  Apparently his gabapentin  was also stopped, which has contributed to some of his cervicogenic pain returning. Function: The patient is still within the acute postop pain period. ROM: The patient is still within the acute postop pain period.   Pharmacotherapy Assessment  Analgesic: No chronic opioid  analgesics therapy prescribed by our practice.  Oxycodone/APAP 5/325 (# 20) 1 4 times daily (last filled on 08/31/2022) prescribed by Dorn Zachary Ned, MD  MME/day: 30 mg/day   Monitoring: [place] PMP: PDMP reviewed during this encounter.       Pharmacotherapy: No side-effects or adverse reactions reported. Compliance: No problems identified. Effectiveness: Clinically acceptable.  No notes on file  No results found for: CBDTHCR No results found for: D8THCCBX No results found for: D9THCCBX  UDS:  No results found for: SUMMARY    ROS  Constitutional: Denies any fever or chills Gastrointestinal: No reported hemesis, hematochezia, vomiting, or acute GI distress Musculoskeletal: Denies any acute onset joint swelling, redness, loss of ROM, or weakness Neurological: No reported episodes of acute onset apraxia, aphasia, dysarthria, agnosia, amnesia, paralysis, loss of coordination, or loss of consciousness  Medication Review  HYDROcodone -acetaminophen , amLODipine, aspirin-acetaminophen -caffeine , cyclobenzaprine , gabapentin , hydrochlorothiazide, omeprazole , ondansetron , and predniSONE   History Review  Allergy: Johnathan Harvey is allergic to protonix  [pantoprazole ]. Drug: Johnathan Harvey  reports that he does not currently use drugs after having used the following drugs: Marijuana. Alcohol:  reports no history of alcohol use. Tobacco:  reports that he has quit smoking. His smoking use included cigarettes. He has never used smokeless tobacco. Social: Johnathan Harvey  reports that he has quit smoking. His smoking use included cigarettes. He has never used smokeless tobacco. He reports that he does not currently use drugs after having used the following drugs: Marijuana. He reports that he does not drink alcohol. Medical:  has a past medical history of Arthritis,  Esophageal stricture, GERD (gastroesophageal reflux disease), History of MRSA infection (2016), Hypertension, Migraine, and Skin  cancer, basal cell. Surgical: Johnathan Harvey  has a past surgical history that includes Ankle arthroplasty; Esophagogastroduodenoscopy (N/A, 11/04/2018); and Inguinal hernia repair (Left, 09/26/2019). Family: family history includes Cancer in his father and mother.  Laboratory Chemistry Profile   Renal Lab Results  Component Value Date   BUN 21 03/17/2022   CREATININE 0.80 03/17/2022   GFRAA >60 10/10/2019   GFRNONAA >60 03/17/2022    Hepatic Lab Results  Component Value Date   AST 20 03/17/2022   ALT 13 03/17/2022   ALBUMIN 4.7 03/17/2022   ALKPHOS 62 03/17/2022   LIPASE 39 03/17/2022    Electrolytes Lab Results  Component Value Date   NA 137 03/17/2022   K 4.3 03/17/2022   CL 105 03/17/2022   CALCIUM 9.3 03/17/2022   MG 2.1 03/11/2021    Bone No results found for: VD25OH, CI874NY7UNU, CI6874NY7, CI7874NY7, 25OHVITD1, 25OHVITD2, 25OHVITD3, TESTOFREE, TESTOSTERONE  Inflammation (CRP: Acute Phase) (ESR: Chronic Phase) Lab Results  Component Value Date   LATICACIDVEN 0.8 10/10/2019         Note: Above Lab results reviewed.  Recent Imaging Review  DG PAIN CLINIC C-ARM 1-60 MIN NO REPORT Fluoro was used, but no Radiologist interpretation will be provided.  Please refer to NOTES tab for provider progress note. Note: Reviewed        Physical Exam  General appearance: Well nourished, well developed, and well hydrated. In no apparent acute distress Mental status: Alert, oriented x 3 (person, place, & time)       Respiratory: No evidence of acute respiratory distress Eyes: PERLA Vitals: BP 136/80   Pulse 71   Temp (!) 97.5 F (36.4 C) (Temporal)   Resp 16   Ht 5' 10 (1.778 m)   Wt 130 lb (59 kg)   SpO2 100%   BMI 18.65 kg/m  BMI: Estimated body mass index is 18.65 kg/m as calculated from the following:   Height as of this encounter: 5' 10 (1.778 m).   Weight as of this encounter: 130 lb (59 kg). Ideal: Ideal body weight: 73 kg (160 lb 15  oz)  Assessment   Diagnosis Status  1. Acute postoperative pain   2. Cervicogenic headache (2ry area of Pain) (Right)   3. Chronic tension-type headache, intractable   4. Headache disorder   5. Cervicalgia (1ry area of Pain)   6. Cervical facet joint pain   7. Cervical facet syndrome   8. Cervical facet hypertrophy (Multilevel) (Bilateral)   9. Cervical facet arthropathy (Multilevel) (Bilateral)   10. Cervical neck pain with evidence of disc disease   11. Chronic neck and back pain   12. 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 Abnormal MRI, cervical spine (09/28/2021)   14. Postop check   15. Nausea without vomiting    Controlled Controlled Controlled   Updated Problems: Problem  Nausea Without Vomiting    Plan of Care  Problem-specific:  Assessment and Plan    Post-Radiofrequency Ablation Pain   He underwent radiofrequency ablation on October 04, 2023, and is experiencing severe pain in the treated area, which is expected as it may worsen before improving, typically taking six weeks to heal. The discontinuation of gabapentin  and Flexeril  has exacerbated his pain, causing morning nausea. We will prescribe one more week of hydrocodone  and prednisone  for potential swelling. A follow-up appointment is scheduled for six weeks post-procedure.  Nausea Secondary to Pain   His morning nausea is due to severe pain. He previously found a sublingual medication for nausea effective, so we will prescribe this sublingual medication again.  Chronic Pain Management   His chronic pain was previously managed with gabapentin  and Flexeril . Currently, he is not taking these medications, contributing to his pain. We instruct him to contact his primary care physician to resume gabapentin  and Flexeril .  Follow-up   We have ensured a follow-up appointment is scheduled for November 15, 2023.       Johnathan Harvey has a current medication list which includes the  following long-term medication(s): amlodipine, hydrochlorothiazide, cyclobenzaprine , gabapentin , and hydrocodone -acetaminophen .  Pharmacotherapy (Medications Ordered): Meds ordered this encounter  Medications   predniSONE  (DELTASONE ) 20 MG tablet    Sig: Take 3 tablets (60 mg total) by mouth daily with breakfast for 3 days, THEN 2 tablets (40 mg total) daily with breakfast for 3 days, THEN 1 tablet (20 mg total) daily with breakfast for 3 days.    Dispense:  18 tablet    Refill:  0   HYDROcodone -acetaminophen  (NORCO/VICODIN) 5-325 MG tablet    Sig: Take 1 tablet by mouth every 8 (eight) hours as needed for up to 7 days for severe pain (pain score 7-10). Must last for 7 days.    Dispense:  21 tablet    Refill:  0    For acute post-operative pain. Not to be refilled. Must last 7 days.   ondansetron  (ZOFRAN -ODT) 4 MG disintegrating tablet    Sig: Take 1 tablet (4 mg total) by mouth every 8 (eight) hours as needed for nausea or vomiting.    Dispense:  20 tablet    Refill:  0   Orders:  Orders Placed This Encounter  Procedures   Nursing Instructions:    Please complete this patient's postprocedure evaluation.    Scheduling Instructions:     Please complete this patient's postprocedure evaluation.   Follow-up plan:   Return for scheduled encounter.      Interventional Therapies  Risk  Complexity Considerations:   Note: Difficult IV stick.   Planned  Pending:   Diagnostic/therapeutic bilateral (C3 + TON) cervical facet RFA #1    Under consideration:   Diagnostic left cervical facet C6-C8 RFA #1   Facet Joint Innervation  C1-2 Third occipital Nerve (TON)  C2-3 TON, C3  Medial Branch  C3-4 C3, C4                     C4-5 C4, C5                     C5-6 C5, C6                     C6-7 C6, C7                     C7-T1 C7, C8                        Completed:   Therapeutic right C3-C8 cervical MB RFA x1 (11/23/2022) (100/100/100/97) Diagnostic bilateral cervical  facet MBB x4 (10/19/2022) (100/100/35/35)  Diagnostic bilateral cervical facet C6-T1 MBB x1 (05/25/2022) (100/100/85/85)  Diagnostic right cervical facet C3-C5 MBB x1 (05/25/2022) (100/100/85/85)  Diagnostic right cervical facet C3-C7 MBB x1 (04/25/2022) (100/100/60/100) (100-Neck/60-H/A)  Diagnostic bilateral cervical facet C7 & T1 MBB x1 (04/25/2022) (100/100/60/100) (100-Neck/60-H/A)  Diagnostic right Cervical ESI  x1 (03/23/2022) (DOS: 10-10/10) (0/0/0/0) (patient indicated having gone to the ED x4 since procedure.)   Completed by other providers:   Therapeutic right C3 and C4 medial branch RFA x1 (07/30/2020) by Morene Falcon, DO [facility])  Diagnostic right C3 and C4 MBB x2 (06/30/2020, 07/15/2020) by Morene Falcon, DO [facility])  Diagnostic right C3-4 zygapophyseal joint (facet joint) inj. x1 (03/05/2020) by Morene Falcon, DO Therapeutic  ACDF C4/5, C5/6, C6/7 (07/04/2022) by Dorn Ned, MD ([facility]) (80% improvement)    Therapeutic  Palliative (PRN) options:   None established   Pharmacotherapy:  Nonopioid transferred (10/02/2022): Gabapentin  (Neurontin ) 300 mg, 3 tabs p.o. at bedtime; Flexeril  10 mg, 1 tab p.o. at bedtime. I believe this regimen to be appropriate for this patient however, we no longer have the manpower to continue managing medications on these patients and therefore I have kindly request that for his PCP to consider taking over it.  The patient was informed that should they decide not to, we do not plan on continuing with this regimen because of the above reasons. Recommendations:   None at this time.      Recent Visits Date Type Provider Dept  10/04/23 Procedure visit Tanya Glisson, MD [facility]  09/24/23 Office Visit Tanya Glisson, MD [facility]  Showing recent visits within past 90 days and meeting all other requirements Today's Visits Date Type Provider Dept  10/23/23 Office Visit Tanya Glisson, MD [facility]   Showing today's visits and meeting all other requirements Future Appointments Date Type Provider Dept  12/06/23 Appointment Tanya Glisson, MD [facility]  Showing future appointments within next 90 days and meeting all other requirements  I discussed the assessment and treatment plan with the patient. The patient was provided an opportunity to ask questions and all were answered. The patient agreed with the plan and demonstrated an understanding of the instructions.  Patient advised to call back or seek an in-person evaluation if the symptoms or condition worsens.  Duration of encounter: 30 minutes.  Total time on encounter, as per AMA guidelines included both the face-to-face and non-face-to-face time personally spent by the physician and/or other qualified health care professional(s) on the day of the encounter (includes time in activities that require the physician or other qualified health care professional and does not include time in activities normally performed by clinical staff). Physician's time may include the following activities when performed: Preparing to see the patient (e.g., pre-charting review of records, searching for previously ordered imaging, lab work, and nerve conduction tests) Review of prior analgesic pharmacotherapies. Reviewing PMP Interpreting ordered tests (e.g., lab work, imaging, nerve conduction tests) Performing post-procedure evaluations, including interpretation of diagnostic procedures Obtaining and/or reviewing separately obtained history Performing a medically appropriate examination and/or evaluation Counseling and educating the patient/family/caregiver Ordering medications, tests, or procedures Referring and communicating with other health care professionals (when not separately reported) Documenting clinical information in the electronic or other health record Independently interpreting results (not separately reported) and  communicating results to the patient/ family/caregiver Care coordination (not separately reported)  Note by: Glisson DELENA Tanya, MD Date: 10/23/2023; Time: 1:51 PM

## 2023-10-23 ENCOUNTER — Ambulatory Visit: Payer: No Typology Code available for payment source | Attending: Pain Medicine | Admitting: Pain Medicine

## 2023-10-23 VITALS — BP 136/80 | HR 71 | Temp 97.5°F | Resp 16 | Ht 70.0 in | Wt 130.0 lb

## 2023-10-23 DIAGNOSIS — M47812 Spondylosis without myelopathy or radiculopathy, cervical region: Secondary | ICD-10-CM | POA: Diagnosis not present

## 2023-10-23 DIAGNOSIS — M5090 Cervical disc disorder, unspecified, unspecified cervical region: Secondary | ICD-10-CM | POA: Diagnosis not present

## 2023-10-23 DIAGNOSIS — M4312 Spondylolisthesis, cervical region: Secondary | ICD-10-CM | POA: Insufficient documentation

## 2023-10-23 DIAGNOSIS — R519 Headache, unspecified: Secondary | ICD-10-CM | POA: Insufficient documentation

## 2023-10-23 DIAGNOSIS — G8929 Other chronic pain: Secondary | ICD-10-CM | POA: Insufficient documentation

## 2023-10-23 DIAGNOSIS — Z09 Encounter for follow-up examination after completed treatment for conditions other than malignant neoplasm: Secondary | ICD-10-CM | POA: Insufficient documentation

## 2023-10-23 DIAGNOSIS — R937 Abnormal findings on diagnostic imaging of other parts of musculoskeletal system: Secondary | ICD-10-CM | POA: Diagnosis not present

## 2023-10-23 DIAGNOSIS — M542 Cervicalgia: Secondary | ICD-10-CM | POA: Insufficient documentation

## 2023-10-23 DIAGNOSIS — R110 Nausea: Secondary | ICD-10-CM | POA: Insufficient documentation

## 2023-10-23 DIAGNOSIS — M549 Dorsalgia, unspecified: Secondary | ICD-10-CM | POA: Insufficient documentation

## 2023-10-23 DIAGNOSIS — G8918 Other acute postprocedural pain: Secondary | ICD-10-CM | POA: Insufficient documentation

## 2023-10-23 DIAGNOSIS — G44221 Chronic tension-type headache, intractable: Secondary | ICD-10-CM | POA: Insufficient documentation

## 2023-10-23 DIAGNOSIS — G4486 Cervicogenic headache: Secondary | ICD-10-CM | POA: Insufficient documentation

## 2023-10-23 MED ORDER — PREDNISONE 20 MG PO TABS: ORAL_TABLET | ORAL | 0 refills | Status: AC

## 2023-10-23 MED ORDER — ONDANSETRON 4 MG PO TBDP: 4.0000 mg | ORAL_TABLET | Freq: Three times a day (TID) | ORAL | 0 refills | Status: DC | PRN

## 2023-10-23 MED ORDER — HYDROCODONE-ACETAMINOPHEN 5-325 MG PO TABS: 1.0000 | ORAL_TABLET | Freq: Three times a day (TID) | ORAL | 0 refills | Status: AC | PRN

## 2023-11-06 ENCOUNTER — Other Ambulatory Visit: Payer: Self-pay

## 2023-11-06 ENCOUNTER — Emergency Department
Admission: EM | Admit: 2023-11-06 | Discharge: 2023-11-07 | Disposition: A | Discharge status: Home / Self Care | Payer: No Typology Code available for payment source | Attending: Emergency Medicine | Admitting: Emergency Medicine

## 2023-11-06 DIAGNOSIS — E876 Hypokalemia: Secondary | ICD-10-CM | POA: Insufficient documentation

## 2023-11-06 DIAGNOSIS — I10 Essential (primary) hypertension: Secondary | ICD-10-CM | POA: Diagnosis not present

## 2023-11-06 DIAGNOSIS — Z79899 Other long term (current) drug therapy: Secondary | ICD-10-CM | POA: Diagnosis not present

## 2023-11-06 DIAGNOSIS — R519 Headache, unspecified: Secondary | ICD-10-CM | POA: Diagnosis not present

## 2023-11-06 DIAGNOSIS — Z85828 Personal history of other malignant neoplasm of skin: Secondary | ICD-10-CM | POA: Diagnosis not present

## 2023-11-06 DIAGNOSIS — D72829 Elevated white blood cell count, unspecified: Secondary | ICD-10-CM | POA: Diagnosis not present

## 2023-11-06 LAB — CBC WITH DIFFERENTIAL/PLATELET
Abs Immature Granulocytes: 0.05 10*3/uL (ref 0.00–0.07)
Basophils Absolute: 0.1 10*3/uL (ref 0.0–0.1)
Basophils Relative: 1 %
Eosinophils Absolute: 0.1 10*3/uL (ref 0.0–0.5)
Eosinophils Relative: 1 %
HCT: 50.4 % (ref 39.0–52.0)
Hemoglobin: 17.8 g/dL — ABNORMAL HIGH (ref 13.0–17.0)
Immature Granulocytes: 0 %
Lymphocytes Relative: 21 %
Lymphs Abs: 2.5 10*3/uL (ref 0.7–4.0)
MCH: 31.2 pg (ref 26.0–34.0)
MCHC: 35.3 g/dL (ref 30.0–36.0)
MCV: 88.4 fL (ref 80.0–100.0)
Monocytes Absolute: 1.2 10*3/uL — ABNORMAL HIGH (ref 0.1–1.0)
Monocytes Relative: 9 %
Neutro Abs: 8.4 10*3/uL — ABNORMAL HIGH (ref 1.7–7.7)
Neutrophils Relative %: 68 %
Platelets: 403 10*3/uL — ABNORMAL HIGH (ref 150–400)
RBC: 5.7 MIL/uL (ref 4.22–5.81)
RDW: 12.3 % (ref 11.5–15.5)
WBC: 12.3 10*3/uL — ABNORMAL HIGH (ref 4.0–10.5)
nRBC: 0 % (ref 0.0–0.2)

## 2023-11-06 MED ORDER — ONDANSETRON HCL 4 MG/2ML IJ SOLN
4.0000 mg | Freq: Once | INTRAMUSCULAR | Status: AC | PRN
  Administered 2023-11-06: 4 mg via INTRAVENOUS
  Filled 2023-11-06: qty 2

## 2023-11-06 NOTE — ED Triage Notes (Signed)
Pt arrives via POV with CC of migraine x3 days and vomiting x1 day. Pt repeatedly dry heaving throughout triage. Pt reports hx of same with "migraine cocktail" resolving symptoms. Pt reports goes to pain clinic for injections r/t chronic back pain.

## 2023-11-07 ENCOUNTER — Telehealth: Payer: Self-pay

## 2023-11-07 LAB — URINALYSIS, ROUTINE W REFLEX MICROSCOPIC
Bilirubin Urine: NEGATIVE
Glucose, UA: NEGATIVE mg/dL
Hgb urine dipstick: NEGATIVE
Ketones, ur: NEGATIVE mg/dL
Leukocytes,Ua: NEGATIVE
Nitrite: NEGATIVE
Protein, ur: 30 mg/dL — AB
Specific Gravity, Urine: 1.031 — ABNORMAL HIGH (ref 1.005–1.030)
pH: 5 (ref 5.0–8.0)

## 2023-11-07 LAB — COMPREHENSIVE METABOLIC PANEL
ALT: 22 U/L (ref 0–44)
AST: 28 U/L (ref 15–41)
Albumin: 4.8 g/dL (ref 3.5–5.0)
Alkaline Phosphatase: 76 U/L (ref 38–126)
Anion gap: 17 — ABNORMAL HIGH (ref 5–15)
BUN: 20 mg/dL (ref 8–23)
CO2: 26 mmol/L (ref 22–32)
Calcium: 9.6 mg/dL (ref 8.9–10.3)
Chloride: 88 mmol/L — ABNORMAL LOW (ref 98–111)
Creatinine, Ser: 1.14 mg/dL (ref 0.61–1.24)
GFR, Estimated: >60 mL/min (ref >60)
Glucose, Bld: 114 mg/dL — ABNORMAL HIGH (ref 70–99)
Potassium: 2.7 mmol/L — CL (ref 3.5–5.1)
Sodium: 131 mmol/L — ABNORMAL LOW (ref 135–145)
Total Bilirubin: 1.2 mg/dL (ref 0.0–1.2)
Total Protein: 8.6 g/dL — ABNORMAL HIGH (ref 6.5–8.1)

## 2023-11-07 LAB — LIPASE, BLOOD: Lipase: 50 U/L (ref 11–51)

## 2023-11-07 LAB — MAGNESIUM: Magnesium: 2.3 mg/dL (ref 1.7–2.4)

## 2023-11-07 MED ORDER — METOCLOPRAMIDE HCL 5 MG/ML IJ SOLN
10.0000 mg | Freq: Once | INTRAMUSCULAR | Status: AC
Start: 2023-11-07 — End: 2023-11-07
  Administered 2023-11-07: 10 mg via INTRAVENOUS
  Filled 2023-11-07: qty 2

## 2023-11-07 MED ORDER — SODIUM CHLORIDE 0.9 % IV BOLUS (SEPSIS)
1000.0000 mL | Freq: Once | INTRAVENOUS | Status: AC
  Administered 2023-11-07: 1000 mL via INTRAVENOUS

## 2023-11-07 MED ORDER — KETOROLAC TROMETHAMINE 30 MG/ML IJ SOLN
30.0000 mg | Freq: Once | INTRAMUSCULAR | Status: AC
  Administered 2023-11-07: 30 mg via INTRAVENOUS
  Filled 2023-11-07: qty 1

## 2023-11-07 MED ORDER — DIPHENHYDRAMINE HCL 50 MG/ML IJ SOLN
25.0000 mg | Freq: Once | INTRAMUSCULAR | Status: AC
  Administered 2023-11-07: 25 mg via INTRAVENOUS
  Filled 2023-11-07: qty 1

## 2023-11-07 MED ORDER — POTASSIUM CHLORIDE CRYS ER 20 MEQ PO TBCR: 40.0000 meq | EXTENDED_RELEASE_TABLET | Freq: Every day | ORAL | 0 refills | Status: DC

## 2023-11-07 MED ORDER — POTASSIUM CHLORIDE CRYS ER 20 MEQ PO TBCR
40.0000 meq | EXTENDED_RELEASE_TABLET | Freq: Once | ORAL | Status: AC
  Administered 2023-11-07: 40 meq via ORAL
  Filled 2023-11-07: qty 2

## 2023-11-07 MED ORDER — ONDANSETRON 4 MG PO TBDP: 4.0000 mg | ORAL_TABLET | Freq: Four times a day (QID) | ORAL | 0 refills | Status: AC | PRN

## 2023-11-07 NOTE — ED Provider Notes (Signed)
[facility] Provider Note    Event Date/Time   First MD Initiated Contact with Patient 11/06/23 2358     (approximate)   History   Migraine and Emesis   HPI  Johnathan Harvey is a 68 y.o. male with history of hypertension, chronic headaches who presents to the emergency department with a posterior headache that started several days ago.  States it started gradually and progressively worsened.  He has had photophobia, phonophobia and nausea and vomiting.  This feels similar to have his previous headaches.  Denies any head injury.  Not on blood thinners.  No numbness, tingling or weakness.  He denies any chest pain, shortness of breath, abdominal pain.  No fever.  Reports when he has these headaches he normally gets a migraine cocktail.   History provided by patient.    Past Medical History:  Diagnosis Date   Arthritis    Esophageal stricture    GERD (gastroesophageal reflux disease)    History of MRSA infection 2016   knee wound   Hypertension    was put on medications in [place] stopped shortly after, no longer needs meds   Migraine    Skin cancer, basal cell     Past Surgical History:  Procedure Laterality Date   ANKLE ARTHROPLASTY     ESOPHAGOGASTRODUODENOSCOPY N/A 11/04/2018   Procedure: ESOPHAGOGASTRODUODENOSCOPY (EGD);  Surgeon: Toney Reil, MD;  Location: [facility] ENDOSCOPY;  Service: Gastroenterology;  Laterality: N/A;   INGUINAL HERNIA REPAIR Left 09/26/2019   Procedure: LEFT INGUINAL HERNIA REPAIR;  Surgeon: Luretha Murphy, MD;  Location: [facility];  Service: General;  Laterality: Left;    MEDICATIONS:  Prior to Admission medications   Medication Sig Start Date End Date Taking? Authorizing Provider  amLODipine (NORVASC) 5 MG tablet Take 5 mg by mouth daily. 08/23/22   [provider]  aspirin-acetaminophen-caffeine (EXCEDRIN MIGRAINE) [phone] MG tablet Take by mouth every 6 (six) hours as needed  for headache. Takes 3 pills in am , Excedrin migraine    [provider]  cyclobenzaprine (FLEXERIL) 10 MG tablet Take 1 tablet (10 mg total) by mouth at bedtime. 10/02/22 05/29/23  Delano Metz, MD  gabapentin (NEURONTIN) 300 MG capsule Take 3 capsules (900 mg total) by mouth at bedtime. 10/02/22 05/29/23  Delano Metz, MD  hydrochlorothiazide (HYDRODIURIL) 25 MG tablet Take 12.5 mg by mouth daily. 12/14/22 12/14/23  [provider]  HYDROcodone-acetaminophen (NORCO/VICODIN) 5-325 MG tablet Take 1 tablet by mouth every 8 (eight) hours as needed for up to 7 days for severe pain (pain score 7-10). Must last for 7 days. 10/23/23 10/30/23  Delano Metz, MD  omeprazole (PRILOSEC) 40 MG capsule Take by mouth. 03/14/23 03/13/24  [provider]  ondansetron (ZOFRAN-ODT) 4 MG disintegrating tablet Take 1 tablet (4 mg total) by mouth every 8 (eight) hours as needed for nausea or vomiting. 10/23/23   Delano Metz, MD    Physical Exam   Triage Vital Signs: ED Triage Vitals  Encounter Vitals Group     BP 11/06/23 2214 (!) 151/95     Systolic BP Percentile --      Diastolic BP Percentile --      Pulse Rate 11/06/23 2214 (!) 112     Resp 11/06/23 2214 (!) 22     Temp 11/06/23 2214 98.4 F (36.9 C)     Temp Source 11/06/23 2214 Oral     SpO2 11/06/23 2214 100 %     Weight 11/06/23  2216 130 lb (59 kg)     Height 11/06/23 2216 5\' 10"  (1.778 m)     Head Circumference --      Peak Flow --      Pain Score 11/06/23 2215 8     Pain Loc --      Pain Education --      Exclude from Growth Chart --     Most recent vital signs: Vitals:   11/07/23 0123 11/07/23 0235  BP: (!) 139/92 124/82  Pulse: 82 75  Resp: 18 17  Temp: 98 F (36.7 C) 97.9 F (36.6 C)  SpO2: 97% 98%    CONSTITUTIONAL: Alert, responds appropriately to questions. Well-appearing; well-nourished HEAD: Normocephalic, atraumatic EYES: Conjunctivae clear, pupils appear equal, sclera  nonicteric ENT: normal nose; moist mucous membranes NECK: Supple, normal ROM, no midline spinal tenderness or step-off or deformity, no meningismus CARD: Regular and tachycardic; S1 and S2 appreciated RESP: Normal chest excursion without splinting or tachypnea; breath sounds clear and equal bilaterally; no wheezes, no rhonchi, no rales, no hypoxia or respiratory distress, speaking full sentences ABD/GI: Non-distended; soft, non-tender, no rebound, no guarding, no peritoneal signs BACK: The back appears normal EXT: Normal ROM in all joints; no deformity noted, no edema SKIN: Normal color for age and race; warm; no rash on exposed skin NEURO: Moves all extremities equally, normal speech, normal sensation diffusely, no facial asymmetry, normal gait PSYCH: The patient's mood and manner are appropriate.   ED Results / Procedures / Treatments   LABS: (all labs ordered are listed, but only abnormal results are displayed) Labs Reviewed  CBC WITH DIFFERENTIAL/PLATELET - Abnormal; Notable for the following components:      Result Value   WBC 12.3 (*)    Hemoglobin 17.8 (*)    Platelets 403 (*)    Neutro Abs 8.4 (*)    Monocytes Absolute 1.2 (*)    All other components within normal limits  COMPREHENSIVE METABOLIC PANEL - Abnormal; Notable for the following components:   Sodium 131 (*)    Potassium 2.7 (*)    Chloride 88 (*)    Glucose, Bld 114 (*)    Total Protein 8.6 (*)    Anion gap 17 (*)    All other components within normal limits  URINALYSIS, ROUTINE W REFLEX MICROSCOPIC - Abnormal; Notable for the following components:   Color, Urine AMBER (*)    APPearance CLEAR (*)    Specific Gravity, Urine 1.031 (*)    Protein, ur 30 (*)    Bacteria, UA RARE (*)    All other components within normal limits  MAGNESIUM  LIPASE, BLOOD     EKG:  EKG Interpretation Date/Time:  Wednesday November 07 2023 00:38:09 EST Ventricular Rate:  84 PR Interval:  138 QRS Duration:  78 QT  Interval:  402 QTC Calculation: 475 R Axis:   -69  Text Interpretation: Normal sinus rhythm Left anterior fascicular block Cannot rule out Inferior infarct (masked by fascicular block?) , age undetermined Abnormal ECG When compared with ECG of 11-Mar-2022 07:06, T wave inversion no longer evident in Inferior leads Nonspecific T wave abnormality, worse in Lateral leads Confirmed by Rochele Raring [id number]) on 11/07/2023 12:45:39 AM         RADIOLOGY: My personal review and interpretation of imaging:    I have personally reviewed all radiology reports.   No results found.   PROCEDURES:  Critical Care performed: No      Procedures    IMPRESSION / MDM /  ASSESSMENT AND PLAN / ED COURSE  I reviewed the triage vital signs and the nursing notes.    Patient here with chronic headache.  History of the same.  Has had nausea and vomiting.  No neurodeficits.  The patient is on the cardiac monitor to evaluate for evidence of arrhythmia and/or significant heart rate changes.   DIFFERENTIAL DIAGNOSIS (includes but not limited to):   Migraine, tension headache, doubt intracranial hemorrhage, stroke, meningitis, CVT   Patient's presentation is most consistent with acute complicated illness / injury requiring diagnostic workup.   PLAN: Will give migraine cocktail.  Labs obtained from triage currently pending.  No focal neurologic deficits.  No sudden onset, thunderclap, worst headache of his life.  No indication for emergent head imaging.   MEDICATIONS GIVEN IN ED: Medications  ondansetron (ZOFRAN) injection 4 mg (4 mg Intravenous Given 11/06/23 2232)  ketorolac (TORADOL) 30 MG/ML injection 30 mg (30 mg Intravenous Given 11/07/23 0018)  metoCLOPramide (REGLAN) injection 10 mg (10 mg Intravenous Given 11/07/23 0019)  diphenhydrAMINE (BENADRYL) injection 25 mg (25 mg Intravenous Given 11/07/23 0019)  sodium chloride 0.9 % bolus 1,000 mL (0 mLs Intravenous Stopped 11/07/23 0225)  potassium  chloride SA (KLOR-CON M) CR tablet 40 mEq (40 mEq Oral Given 11/07/23 0120)     ED COURSE: Labs obtained from triage show leukocytosis of 12.3.  Potassium of 2.7 likely from GI loss.  EKG shows no interval changes.  Will add on magnesium level.    Magnesium level normal.  Patient reports headache is gone.  Tolerating p.o.  Will discharge with potassium tablets and have him follow-up with his PCP to have this rechecked.  Patient verbalized understanding.    At this time, I do not feel there is any life-threatening condition present. I reviewed all nursing notes, vitals, pertinent previous records.  All lab and urine results, EKGs, imaging ordered have been independently reviewed and interpreted by myself.  I reviewed all available radiology reports from any imaging ordered this visit.  Based on my assessment, I feel the patient is safe to be discharged home without further emergent workup and can continue workup as an outpatient as needed. Discussed all findings, treatment plan as well as usual and customary return precautions.  They verbalize understanding and are comfortable with this plan.  Outpatient follow-up has been provided as needed.  All questions have been answered.  CONSULTS:  none   OUTSIDE RECORDS REVIEWED: Reviewed previous pain management note on 10/23/2023 for chronic tension type headache, cervicalgia.       FINAL CLINICAL IMPRESSION(S) / ED DIAGNOSES   Final diagnoses:  Generalized headache  Hypokalemia     Rx / DC Orders   ED Discharge Orders          Ordered    potassium chloride SA (KLOR-CON M) 20 MEQ tablet  Daily        11/07/23 0240    ondansetron (ZOFRAN-ODT) 4 MG disintegrating tablet  Every 6 hours PRN        11/07/23 0240             Note:  This document was prepared using Dragon voice recognition software and may include unintentional dictation errors.   Lexxi Koslow, Layla Maw, DO 11/07/23 [phone]

## 2023-11-07 NOTE — ED Notes (Signed)
Pt states he thinks he will be able to tolerate po after nausea medications.

## 2023-11-07 NOTE — ED Notes (Signed)
Pt states improved nausea.  

## 2023-11-07 NOTE — Discharge Instructions (Addendum)
Your potassium level today was low at 2.7.  This could be secondary to vomiting from your headache but also can happen when you are on diuretics chronically such as hydrochlorothiazide.  I recommend that you take potassium tablets for the next several days and follow-up with your PCP to have your potassium level rechecked.  You may continue over-the-counter Excedrin Migraine as needed for headaches.  We have discharged you with a prescription of Zofran to take as needed for nausea and vomiting.

## 2023-11-07 NOTE — Telephone Encounter (Signed)
He went to the ER last night for migraines. They gave him a cocktail IV. He has been taking hydrocodone which helps but is out. I will schedule an appt.

## 2023-11-08 ENCOUNTER — Encounter: Payer: Self-pay | Admitting: Emergency Medicine

## 2023-11-08 ENCOUNTER — Emergency Department
Admission: EM | Admit: 2023-11-08 | Discharge: 2023-11-09 | Disposition: A | Discharge status: Home / Self Care | Payer: No Typology Code available for payment source | Attending: Emergency Medicine | Admitting: Emergency Medicine

## 2023-11-08 ENCOUNTER — Other Ambulatory Visit: Payer: Self-pay

## 2023-11-08 DIAGNOSIS — R519 Headache, unspecified: Secondary | ICD-10-CM | POA: Insufficient documentation

## 2023-11-08 DIAGNOSIS — I10 Essential (primary) hypertension: Secondary | ICD-10-CM | POA: Insufficient documentation

## 2023-11-08 LAB — BASIC METABOLIC PANEL
Anion gap: 16 — ABNORMAL HIGH (ref 5–15)
BUN: 15 mg/dL (ref 8–23)
CO2: 26 mmol/L (ref 22–32)
Calcium: 9.5 mg/dL (ref 8.9–10.3)
Chloride: 94 mmol/L — ABNORMAL LOW (ref 98–111)
Creatinine, Ser: 0.85 mg/dL (ref 0.61–1.24)
GFR, Estimated: >60 mL/min (ref >60)
Glucose, Bld: 120 mg/dL — ABNORMAL HIGH (ref 70–99)
Potassium: 3.2 mmol/L — ABNORMAL LOW (ref 3.5–5.1)
Sodium: 136 mmol/L (ref 135–145)

## 2023-11-08 MED ORDER — POTASSIUM CHLORIDE CRYS ER 20 MEQ PO TBCR
40.0000 meq | EXTENDED_RELEASE_TABLET | Freq: Once | ORAL | Status: AC
  Administered 2023-11-09: 40 meq via ORAL
  Filled 2023-11-08: qty 2

## 2023-11-08 MED ORDER — KETOROLAC TROMETHAMINE 30 MG/ML IJ SOLN
30.0000 mg | Freq: Once | INTRAMUSCULAR | Status: AC
  Administered 2023-11-08: 30 mg via INTRAVENOUS
  Filled 2023-11-08: qty 1

## 2023-11-08 MED ORDER — DIPHENHYDRAMINE HCL 50 MG/ML IJ SOLN
25.0000 mg | Freq: Once | INTRAMUSCULAR | Status: AC
  Administered 2023-11-08: 25 mg via INTRAVENOUS
  Filled 2023-11-08: qty 1

## 2023-11-08 MED ORDER — SODIUM CHLORIDE 0.9 % IV BOLUS
500.0000 mL | Freq: Once | INTRAVENOUS | Status: AC
  Administered 2023-11-08: 500 mL via INTRAVENOUS

## 2023-11-08 MED ORDER — METOCLOPRAMIDE HCL 5 MG/ML IJ SOLN
10.0000 mg | Freq: Once | INTRAMUSCULAR | Status: AC
  Administered 2023-11-08: 10 mg via INTRAVENOUS
  Filled 2023-11-08: qty 2

## 2023-11-08 NOTE — ED Provider Notes (Signed)
[facility] Emergency Department Provider Note     Event Date/Time   First MD Initiated Contact with Patient 11/08/23 2119     (approximate)   History   Migraine   HPI  Johnathan Harvey is a 68 y.o. male with a history of hypertension, chronic headaches who presents to the emergency department with a posterior headache that started today.  Patient was evaluated in this ED for the same complaint 2 days prior, and found to have a reassuring workup at this time.  Denies any preceding injury, trauma, fall.  No recent illness or fevers reported.  He was found 2 days ago to be hypokalemic.  Patient was discharged to follow-up with primary provider.  He has tried OTC Excedrin and his prescription Excedrin with limited benefit.  Physical Exam   Triage Vital Signs: ED Triage Vitals  Encounter Vitals Group     BP 11/08/23 1931 (!) 148/91     Systolic BP Percentile --      Diastolic BP Percentile --      Pulse Rate 11/08/23 1931 97     Resp 11/08/23 1931 20     Temp 11/08/23 1931 98.9 F (37.2 C)     Temp Source 11/08/23 1931 Oral     SpO2 11/08/23 1931 95 %     Weight 11/08/23 1931 130 lb (59 kg)     Height 11/08/23 1931 5\' 10"  (1.778 m)     Head Circumference --      Peak Flow --      Pain Score 11/08/23 1935 10     Pain Loc --      Pain Education --      Exclude from Growth Chart --     Most recent vital signs: Vitals:   11/08/23 1931  BP: (!) 148/91  Pulse: 97  Resp: 20  Temp: 98.9 F (37.2 C)  SpO2: 95%    General Awake, no distress. NAD.  HEENT NCAT. PERRL. EOMI. No rhinorrhea. Mucous membranes are moist.  CV:  Good peripheral perfusion. RRR RESP:  Normal effort. CTA ABD:  No distention.  Patient retching and dry heaving prior evaluation.  No active vomiting is appreciated. NEURO: Cranial nerves II to XII grossly intact   ED Results / Procedures / Treatments   Labs (all labs ordered are listed, but only abnormal results are  displayed) Labs Reviewed  BASIC METABOLIC PANEL     EKG   RADIOLOGY  No results found.   PROCEDURES:  Critical Care performed: No  Procedures   MEDICATIONS ORDERED IN ED: Medications  sodium chloride 0.9 % bolus 500 mL (500 mLs Intravenous New Bag/Given 11/08/23 2304)  ketorolac (TORADOL) 30 MG/ML injection 30 mg (30 mg Intravenous Given 11/08/23 2303)  diphenhydrAMINE (BENADRYL) injection 25 mg (25 mg Intravenous Given 11/08/23 2303)  metoCLOPramide (REGLAN) injection 10 mg (10 mg Intravenous Given 11/08/23 2303)     IMPRESSION / MDM / ASSESSMENT AND PLAN / ED COURSE  I reviewed the triage vital signs and the nursing notes.                              Differential diagnosis includes, but is not limited to, intracranial hemorrhage, meningitis/encephalitis, previous head trauma, cavernous venous thrombosis, tension headache, temporal arteritis, migraine or migraine equivalent, idiopathic intracranial hypertension, and non-specific headache.   Patient's presentation is most consistent with acute complicated illness / injury requiring diagnostic workup.  Patient's diagnosis is consistent with bad migraine headache.  Patient is receiving a fluid bolus as well as IV headache cocktail including Reglan, Benadryl, and ketorolac.   ----------------------------------------- 11:14 PM on 11/08/2023 ----------------------------------------- Care is transferred to my attending K. Ward, DO at shift change for reassessment after labs and meds are provided.   FINAL CLINICAL IMPRESSION(S) / ED DIAGNOSES   Final diagnoses:  Bad headache     Rx / DC Orders   ED Discharge Orders     None        Note:  This document was prepared using Dragon voice recognition software and may include unintentional dictation errors.    Lissa Hoard, PA-C 11/08/23 2314    Ward, Layla Maw, DO 11/09/23 [phone]

## 2023-11-08 NOTE — ED Triage Notes (Signed)
Patient ambulatory to triage with complaints of migraine today with "vomiting". Patient was found to be sticking his fingers down his throat in triage to induce vomiting. Patient states he called his PCP who recommended he come in again. Patient tried excedrin and zofran PTA without relief.

## 2023-11-09 DIAGNOSIS — R519 Headache, unspecified: Secondary | ICD-10-CM | POA: Diagnosis not present

## 2023-11-09 DIAGNOSIS — I10 Essential (primary) hypertension: Secondary | ICD-10-CM | POA: Diagnosis not present

## 2023-11-09 DIAGNOSIS — R142 Eructation: Secondary | ICD-10-CM | POA: Diagnosis not present

## 2023-11-09 DIAGNOSIS — Z87891 Personal history of nicotine dependence: Secondary | ICD-10-CM | POA: Diagnosis not present

## 2023-11-09 DIAGNOSIS — R112 Nausea with vomiting, unspecified: Secondary | ICD-10-CM | POA: Diagnosis not present

## 2023-11-09 NOTE — ED Provider Notes (Signed)
11:40 PM  Assumed care at shift change.  Patient with history of chronic headaches who presents today with similar presentation.  Reports headaches improved with migraine cocktail.  Was seen several days ago for the same.  Was hypokalemic at that time.  Repeat BMP pending.   12:25 AM  Pt reports headache is gone.  Potassium has improved to 3.2.  I feel he is safe for discharge.  He has outpatient follow-up.  He is comfortable with this plan.   Sable Knoles, Layla Maw, DO 11/09/23 0025

## 2023-11-10 DIAGNOSIS — R519 Headache, unspecified: Secondary | ICD-10-CM | POA: Diagnosis not present

## 2023-11-10 DIAGNOSIS — I10 Essential (primary) hypertension: Secondary | ICD-10-CM | POA: Diagnosis not present

## 2023-11-10 DIAGNOSIS — R1110 Vomiting, unspecified: Secondary | ICD-10-CM | POA: Diagnosis not present

## 2023-11-10 DIAGNOSIS — R112 Nausea with vomiting, unspecified: Secondary | ICD-10-CM | POA: Diagnosis not present

## 2023-11-11 DIAGNOSIS — G44001 Cluster headache syndrome, unspecified, intractable: Secondary | ICD-10-CM | POA: Diagnosis not present

## 2023-11-11 DIAGNOSIS — R519 Headache, unspecified: Secondary | ICD-10-CM | POA: Diagnosis not present

## 2023-11-14 DIAGNOSIS — R51 Headache with orthostatic component, not elsewhere classified: Secondary | ICD-10-CM | POA: Diagnosis not present

## 2023-11-14 DIAGNOSIS — M542 Cervicalgia: Secondary | ICD-10-CM | POA: Diagnosis not present

## 2023-11-14 DIAGNOSIS — R0683 Snoring: Secondary | ICD-10-CM | POA: Diagnosis not present

## 2023-11-14 DIAGNOSIS — G43811 Other migraine, intractable, with status migrainosus: Secondary | ICD-10-CM | POA: Diagnosis not present

## 2023-11-14 DIAGNOSIS — R519 Headache, unspecified: Secondary | ICD-10-CM | POA: Diagnosis not present

## 2023-11-14 DIAGNOSIS — R5382 Chronic fatigue, unspecified: Secondary | ICD-10-CM | POA: Diagnosis not present

## 2023-11-15 ENCOUNTER — Ambulatory Visit: Payer: No Typology Code available for payment source | Admitting: Pain Medicine

## 2023-11-15 NOTE — Progress Notes (Deleted)
Marland Kitchen

## 2023-11-19 ENCOUNTER — Encounter: Payer: No Typology Code available for payment source | Admitting: Pain Medicine

## 2023-11-19 DIAGNOSIS — M47812 Spondylosis without myelopathy or radiculopathy, cervical region: Secondary | ICD-10-CM

## 2023-11-19 DIAGNOSIS — M542 Cervicalgia: Secondary | ICD-10-CM

## 2023-11-19 DIAGNOSIS — G4486 Cervicogenic headache: Secondary | ICD-10-CM

## 2023-11-20 ENCOUNTER — Other Ambulatory Visit: Payer: Self-pay | Admitting: Physician Assistant

## 2023-11-20 DIAGNOSIS — G4486 Cervicogenic headache: Secondary | ICD-10-CM

## 2023-11-20 DIAGNOSIS — R51 Headache with orthostatic component, not elsewhere classified: Secondary | ICD-10-CM

## 2023-11-20 DIAGNOSIS — M542 Cervicalgia: Secondary | ICD-10-CM

## 2023-11-20 DIAGNOSIS — R29818 Other symptoms and signs involving the nervous system: Secondary | ICD-10-CM

## 2023-11-21 ENCOUNTER — Encounter: Payer: Self-pay | Admitting: Physician Assistant

## 2023-12-06 ENCOUNTER — Ambulatory Visit: Payer: No Typology Code available for payment source | Admitting: Pain Medicine

## 2023-12-07 DIAGNOSIS — I6523 Occlusion and stenosis of bilateral carotid arteries: Secondary | ICD-10-CM | POA: Diagnosis not present

## 2023-12-17 ENCOUNTER — Ambulatory Visit
Admission: RE | Admit: 2023-12-17 | Discharge: 2023-12-17 | Disposition: A | Discharge status: Home / Self Care | Payer: No Typology Code available for payment source | Source: Ambulatory Visit | Attending: Physician Assistant | Admitting: Physician Assistant

## 2023-12-17 DIAGNOSIS — M542 Cervicalgia: Secondary | ICD-10-CM

## 2023-12-17 DIAGNOSIS — R51 Headache with orthostatic component, not elsewhere classified: Secondary | ICD-10-CM

## 2023-12-17 DIAGNOSIS — R29818 Other symptoms and signs involving the nervous system: Secondary | ICD-10-CM

## 2023-12-17 DIAGNOSIS — G4486 Cervicogenic headache: Secondary | ICD-10-CM

## 2023-12-17 MED ORDER — GADOPICLENOL 0.5 MMOL/ML IV SOLN
6.0000 mL | Freq: Once | INTRAVENOUS | Status: AC | PRN
Start: 2023-12-17 — End: 2023-12-17
  Administered 2023-12-17: 6 mL via INTRAVENOUS

## 2023-12-17 NOTE — Progress Notes (Unsigned)
 PROVIDER NOTE: Information contained herein reflects review and annotations entered in association with encounter. Interpretation of such information and data should be left to medically-trained personnel. Information provided to patient can be located elsewhere in the medical record under "Patient Instructions". Document created using STT-dictation technology, any transcriptional errors that may result from process are unintentional.    Patient: Johnathan Harvey  Service Category: E/M  Provider: Oswaldo Done, MD  DOB: 1956/04/06  DOS: 12/18/2023  Referring Provider: Danella Penton, MD  MRN: [id number]  Specialty: Interventional Pain Management  PCP: Danella Penton, MD  Type: Established Patient  Setting: Ambulatory outpatient    Location: Office  Delivery: Face-to-face     HPI  [name], a 68 y.o. year old male, is here today because of his Cervicogenic headache [G44.86]. [name] primary complain today is No chief complaint on file.  Pertinent problems: 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); Cervical facet arthropathy (Multilevel) (Bilateral); DDD (degenerative disc disease), cervical; Cervical foraminal stenosis (Bilateral: C6-7) (Right: C4-5) (Left: C5-6); Cervicogenic headache (2ry area of Pain) (Right); Cervical facet syndrome; Spondylosis without myelopathy or radiculopathy, cervical region; Occipital headache (Right); Neck pain of over 3 months duration; Cervical neck pain with evidence of disc disease; Chronic neck and back pain; Musculoskeletal disorder involving upper trapezius muscle (Right); Cervical facet hypertrophy (Multilevel) (Bilateral);  Postoperative pain after spinal surgery; Neck pain with history of cervical spinal surgery; Spinal stenosis in cervical region; and Cervical facet joint pain on their pertinent problem list. Pain Assessment: Severity of   is reported as a  /10. Location:    / . Onset:  . Quality:  . Timing:  . Modifying factor(s):  Marland Kitchen Vitals:  vitals were not taken for this visit.  BMI: Estimated body mass index is 18.65 kg/m as calculated from the following:   Height as of 11/08/23: 5\' 10"  (1.778 m).   Weight as of 11/08/23: 130 lb (59 kg). Last encounter: 10/23/2023. Last procedure: 10/04/2023.  Reason for encounter: post-procedure evaluation and assessment. ***  Discussed the use of AI scribe software for clinical note transcription with the patient, who gave verbal consent to proceed.  History of Present Illness          Post-procedure evaluation    Procedure:          Anesthesia, Analgesia, Anxiolysis:  Type: Cervical C3 Medial Branch + Third Occipital Nerve (TON) Radiofrequency Ablation  #1  Primary Purpose: Diagnostic Region: Posterolateral cervical spine region Level: C3 Medial Branch Level(s). Lesioning of these levels should completely denervate the occipital nerve. Laterality:  Bilateral  Paraspinal  Anesthesia: Local (1-2% Lidocaine)  Anxiolysis: IV Versed 2mg   Sedation: Moderate Fentanyl 2cc Guidance: Fluoroscopy Spinal ([id number])   Position: Prone   Indications: 1. Cervicogenic headache (2ry area of Pain) (Right)   2. Chronic tension-type headache, intractable   3. Headache disorder   4. Cervicalgia (1ry area of Pain)   5. Cervical facet joint pain   6. Cervical facet syndrome   7. Cervical facet hypertrophy (Multilevel) (Bilateral)   8. Cervical facet arthropathy (Multilevel) (Bilateral)   9. Cervical neck pain with evidence of disc disease   10. Chronic neck and back pain   11. Grade 1 Anterolisthesis of cervical spine (C3/C4 &  C7/T1)   12. Abnormal MRI, cervical spine  (09/28/2021)    Pain Score: Pre-procedure: 2 /10 Post-procedure: 0-No pain/10      Effectiveness:  Initial hour after procedure:   ***. Subsequent 4-6 hours post-procedure:   ***. Analgesia past initial 6 hours:   ***. Ongoing improvement:  Analgesic:  *** Function:    ***    ROM:    ***      Pharmacotherapy Assessment  Analgesic: No chronic opioid analgesics therapy prescribed by our practice.  Oxycodone/APAP 5/325 (# 20) 1 4 times daily (last filled on 08/31/2022) prescribed by Ander Gaster, MD  MME/day: 30 mg/day   Monitoring: [place] PMP: PDMP reviewed during this encounter.       Pharmacotherapy: No side-effects or adverse reactions reported. Compliance: No problems identified. Effectiveness: Clinically acceptable.  No notes on file  No results found for: "CBDTHCR" No results found for: "D8THCCBX" No results found for: "D9THCCBX"  UDS:  No results found for: "SUMMARY"    ROS  Constitutional: Denies any fever or chills Gastrointestinal: No reported hemesis, hematochezia, vomiting, or acute GI distress Musculoskeletal: Denies any acute onset joint swelling, redness, loss of ROM, or weakness Neurological: No reported episodes of acute onset apraxia, aphasia, dysarthria, agnosia, amnesia, paralysis, loss of coordination, or loss of consciousness  Medication Review  HYDROcodone-acetaminophen, amLODipine, aspirin-acetaminophen-caffeine, cyclobenzaprine, gabapentin, hydrochlorothiazide, omeprazole, ondansetron, and potassium chloride SA  History Review  Allergy: [name] is allergic to protonix [pantoprazole]. Drug: [name]  reports that he does not currently use drugs after having used the following drugs: Marijuana. Alcohol:  reports no history of alcohol use. Tobacco:  reports that he has quit smoking. His smoking use included cigarettes. He has never used smokeless tobacco. Social: [name]  reports that he has quit smoking. His smoking use  included cigarettes. He has never used smokeless tobacco. He reports that he does not currently use drugs after having used the following drugs: Marijuana. He reports that he does not drink alcohol. Medical:  has a past medical history of Arthritis, Esophageal stricture, GERD (gastroesophageal reflux disease), History of MRSA infection (2016), Hypertension, Migraine, and Skin cancer, basal cell. Surgical: [name]  has a past surgical history that includes Ankle arthroplasty; Esophagogastroduodenoscopy (N/A, 11/04/2018); and Inguinal hernia repair (Left, 09/26/2019). Family: family history includes Cancer in his father and mother.  Laboratory Chemistry Profile   Renal Lab Results  Component Value Date   BUN 15 11/08/2023   CREATININE 0.85 11/08/2023   GFRAA >60 10/10/2019   GFRNONAA >60 11/08/2023    Hepatic Lab Results  Component Value Date   AST 28 11/06/2023   ALT 22 11/06/2023   ALBUMIN 4.8 11/06/2023   ALKPHOS 76 11/06/2023   LIPASE 50 11/06/2023    Electrolytes Lab Results  Component Value Date   NA 136 11/08/2023   K 3.2 (L) 11/08/2023   CL 94 (L) 11/08/2023   CALCIUM 9.5 11/08/2023   MG 2.3 11/07/2023    Bone No results found for: "VD25OH", "VD125OH2TOT", "ZO1096EA5", "WU9811BJ4", "25OHVITD1", "25OHVITD2", "25OHVITD3", "TESTOFREE", "TESTOSTERONE"  Inflammation (CRP: Acute Phase) (ESR: Chronic Phase) Lab Results  Component Value Date   LATICACIDVEN 0.8 10/10/2019         Note: Above Lab results reviewed.  Recent Imaging Review  DG PAIN CLINIC C-ARM 1-60 MIN NO REPORT Fluoro was used, but no Radiologist interpretation will be provided.  Please refer to "NOTES" tab for provider progress note. Note: Reviewed        Physical Exam  General appearance: Well nourished, well developed, and well hydrated. In no apparent acute distress Mental status: Alert, oriented x 3 (person, place, & time)       Respiratory: No evidence of acute respiratory distress Eyes:  PERLA Vitals: There were no vitals taken for this visit. BMI: Estimated body mass index is 18.65 kg/m as calculated from the following:   Height as of 11/08/23: 5\' 10"  (1.778 m).   Weight as of 11/08/23: 130 lb (59 kg). Ideal: Patient weight not recorded  Assessment   Diagnosis Status  1. Cervicogenic headache (2ry area of Pain) (Right)   2. Cervicalgia (1ry area of Pain)   3. Cervical facet joint pain   4. Cervical facet syndrome   5. Postop check    Controlled Controlled Controlled   Updated Problems: No problems updated.  Plan of Care  Problem-specific:  Assessment and Plan            Johnathan Harvey has a current medication list which includes the following long-term medication(s): amlodipine, cyclobenzaprine, gabapentin, hydrochlorothiazide, hydrocodone-acetaminophen, and potassium chloride sa.  Pharmacotherapy (Medications Ordered): No orders of the defined types were placed in this encounter.  Orders:  No orders of the defined types were placed in this encounter.  Follow-up plan:   No follow-ups on file.      Interventional Therapies  Risk  Complexity Considerations:   Note: Difficult IV stick.   Planned  Pending:   Diagnostic/therapeutic bilateral (C3 + TON) cervical facet RFA #1    Under consideration:   Diagnostic left cervical facet C6-C8 RFA #1   Facet Joint Innervation  C1-2 Third occipital Nerve (TON)  C2-3 TON, C3  Medial Branch  C3-4 C3, C4         "          "  C4-5 C4, C5         "          "  C5-6 C5, C6         "          "  C6-7 C6, C7         "          "  C7-T1 C7, C8         "          "     Completed:   Therapeutic right C3-C8 cervical MB RFA x1 (11/23/2022) (100/100/100/97) Diagnostic bilateral cervical facet MBB x4 (10/19/2022) (100/100/35/35)  Diagnostic bilateral cervical facet C6-T1 MBB x1 (05/25/2022) (100/100/85/85)  Diagnostic right cervical facet C3-C5 MBB x1 (05/25/2022) (100/100/85/85)  Diagnostic right  cervical facet C3-C7 MBB x1 (04/25/2022) (100/100/60/100) (100-Neck/60-H/A)  Diagnostic bilateral cervical facet C7 & T1 MBB x1 (04/25/2022) (100/100/60/100) (100-Neck/60-H/A)  Diagnostic right Cervical ESI x1 (03/23/2022) (DOS: 10-10/10) (0/0/0/0) (patient indicated having gone to the ED x4 since procedure.)   Completed by other providers:   Therapeutic right C3 and C4 medial branch RFA x1 (07/30/2020) by Merri Ray, DO [facility])  Diagnostic right C3 and C4 MBB x2 (06/30/2020, 07/15/2020) by Merri Ray, DO [facility] PMR)  Diagnostic right C3-4 zygapophyseal joint (facet joint) inj. x1 (03/05/2020) by Merri Ray, DO Therapeutic  ACDF C4/5, C5/6, C6/7 (07/04/2022) by Hoyt Koch, MD ([place] [facility]) (80% improvement)    Therapeutic  Palliative (PRN) options:   None established   Pharmacotherapy:  Nonopioid transferred (10/02/2022): Gabapentin (Neurontin) 300 mg, 3 tabs p.o. at bedtime; Flexeril 10 mg, 1 tab p.o. at bedtime.  I believe this regimen to be appropriate for this patient however, we no longer have the manpower to continue managing medications on these patients and therefore I have kindly request that for his PCP to consider taking over it.  The patient was informed that should they decide not to, we do not plan on continuing with this regimen because of the above reasons. Recommendations:   None at this time.      Recent Visits Date Type Provider Dept  10/23/23 Office Visit Delano Metz, MD [facility]  10/04/23 Procedure visit Delano Metz, MD [facility]  09/24/23 Office Visit Delano Metz, MD [facility]  Showing recent visits within past 90 days and meeting all other requirements Future Appointments Date Type Provider Dept  12/18/23 Appointment Delano Metz, MD [facility]  Showing future appointments within next 90 days and meeting all other requirements  I discussed the assessment and treatment plan with  the patient. The patient was provided an opportunity to ask questions and all were answered. The patient agreed with the plan and demonstrated an understanding of the instructions.  Patient advised to call back or seek an in-person evaluation if the symptoms or condition worsens.  Duration of encounter: *** minutes.  Total time on encounter, as per AMA guidelines included both the face-to-face and non-face-to-face time personally spent by the physician and/or other qualified health care professional(s) on the day of the encounter (includes time in activities that require the physician or other qualified health care professional and does not include time in activities normally performed by clinical staff). Physician's time may include the following activities when performed: Preparing to see the patient (e.g., pre-charting review of records, searching for previously ordered imaging, lab work, and nerve conduction tests) Review of prior analgesic pharmacotherapies. Reviewing PMP Interpreting ordered tests (e.g., lab work, imaging, nerve conduction tests) Performing post-procedure evaluations, including interpretation of diagnostic procedures Obtaining and/or reviewing separately obtained history Performing a medically appropriate examination and/or evaluation Counseling and educating the patient/family/caregiver Ordering medications, tests, or procedures Referring and communicating with other health care professionals (when not separately reported) Documenting clinical information in the electronic or other health record Independently interpreting results (not separately reported) and communicating results to the patient/ family/caregiver Care coordination (not separately reported)  Note by: Oswaldo Done, MD Date: 12/18/2023; Time: 7:52 AM

## 2023-12-18 ENCOUNTER — Ambulatory Visit (HOSPITAL_BASED_OUTPATIENT_CLINIC_OR_DEPARTMENT_OTHER): Payer: No Typology Code available for payment source | Admitting: Pain Medicine

## 2023-12-18 DIAGNOSIS — M47812 Spondylosis without myelopathy or radiculopathy, cervical region: Secondary | ICD-10-CM

## 2023-12-18 DIAGNOSIS — G4486 Cervicogenic headache: Secondary | ICD-10-CM

## 2023-12-18 DIAGNOSIS — M542 Cervicalgia: Secondary | ICD-10-CM

## 2023-12-18 DIAGNOSIS — Z91199 Patient's noncompliance with other medical treatment and regimen due to unspecified reason: Secondary | ICD-10-CM

## 2023-12-18 DIAGNOSIS — Z09 Encounter for follow-up examination after completed treatment for conditions other than malignant neoplasm: Secondary | ICD-10-CM

## 2023-12-26 NOTE — Progress Notes (Unsigned)
 PROVIDER NOTE: Information contained herein reflects review and annotations entered in association with encounter. Interpretation of such information and data should be left to medically-trained personnel. Information provided to patient can be located elsewhere in the medical record under "Patient Instructions". Document created using STT-dictation technology, any transcriptional errors that may result from process are unintentional.    Patient: Johnathan Harvey  Service Category: E/M  Provider: Oswaldo Done, MD  DOB: 08-Dec-1955  DOS: 12/27/2023  Referring Provider: Danella Penton, MD  MRN: [id number]  Specialty: Interventional Pain Management  PCP: Danella Penton, MD  Type: Established Patient  Setting: Ambulatory outpatient    Location: Office  Delivery: Face-to-face     HPI  [name], a 68 y.o. year old male, is here today because of his Cervicogenic headache [G44.86]. Mr. Ramp's primary complain today is No chief complaint on file.  Pertinent problems: 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); Cervical facet arthropathy (Multilevel) (Bilateral); DDD (degenerative disc disease), cervical; Cervical foraminal stenosis (Bilateral: C6-7) (Right: C4-5) (Left: C5-6); Cervicogenic headache (2ry area of Pain) (Right); Cervical facet syndrome; Spondylosis without myelopathy or radiculopathy, cervical region; Occipital headache (Right); Neck pain of over 3 months duration; Cervical neck pain with evidence of disc disease; Chronic neck and back pain; Musculoskeletal disorder involving upper trapezius muscle (Right); Cervical facet hypertrophy (Multilevel) (Bilateral);  Postoperative pain after spinal surgery; Neck pain with history of cervical spinal surgery; Spinal stenosis in cervical region; and Cervical facet joint pain on their pertinent problem list. Pain Assessment: Severity of   is reported as a  /10. Location:    / . Onset:  . Quality:  . Timing:  . Modifying factor(s):  Marland Kitchen Vitals:  vitals were not taken for this visit.  BMI: Estimated body mass index is 18.65 kg/m as calculated from the following:   Height as of 11/08/23: 5\' 10"  (1.778 m).   Weight as of 11/08/23: 130 lb (59 kg). Last encounter: 12/18/2023. Last procedure: 10/04/2023.  Reason for encounter: post-procedure evaluation and assessment. ***  Discussed the use of AI scribe software for clinical note transcription with the patient, who gave verbal consent to proceed.  History of Present Illness          Post-procedure evaluation     Procedure:          Anesthesia, Analgesia, Anxiolysis:  Type: Cervical C3 Medial Branch + Third Occipital Nerve (TON) Radiofrequency Ablation  #1  Primary Purpose: Diagnostic Region: Posterolateral cervical spine region Level: C3 Medial Branch Level(s). Lesioning of these levels should completely denervate the occipital nerve. Laterality:  Bilateral  Paraspinal  Anesthesia: Local (1-2% Lidocaine)  Anxiolysis: IV Versed 2mg   Sedation: Moderate Fentanyl 2cc Guidance: Fluoroscopy Spinal ([id number])   Position: Prone   Indications: 1. Cervicogenic headache (2ry area of Pain) (Right)   2. Chronic tension-type headache, intractable   3. Headache disorder   4. Cervicalgia (1ry area of Pain)   5. Cervical facet joint pain   6. Cervical facet syndrome   7. Cervical facet hypertrophy (Multilevel) (Bilateral)   8. Cervical facet arthropathy (Multilevel) (Bilateral)   9. Cervical neck pain with evidence of disc disease   10. Chronic neck and back pain   11. Grade 1 Anterolisthesis of cervical spine (C3/C4 &  C7/T1)   12. Abnormal MRI, cervical spine  (09/28/2021)    Pain Score: Pre-procedure: 2 /10 Post-procedure: 0-No pain/10      Effectiveness:  Initial hour after procedure:   ***. Subsequent 4-6 hours post-procedure:   ***. Analgesia past initial 6 hours:   ***. Ongoing improvement:  Analgesic:  *** Function:    ***    ROM:    ***      Pharmacotherapy Assessment  Analgesic: No chronic opioid analgesics therapy prescribed by our practice.  Oxycodone/APAP 5/325 (# 20) 1 4 times daily (last filled on 08/31/2022) prescribed by Ander Gaster, MD  MME/day: 30 mg/day   Monitoring: [place] PMP: PDMP reviewed during this encounter.       Pharmacotherapy: No side-effects or adverse reactions reported. Compliance: No problems identified. Effectiveness: Clinically acceptable.  No notes on file  No results found for: "CBDTHCR" No results found for: "D8THCCBX" No results found for: "D9THCCBX"  UDS:  No results found for: "SUMMARY"    ROS  Constitutional: Denies any fever or chills Gastrointestinal: No reported hemesis, hematochezia, vomiting, or acute GI distress Musculoskeletal: Denies any acute onset joint swelling, redness, loss of ROM, or weakness Neurological: No reported episodes of acute onset apraxia, aphasia, dysarthria, agnosia, amnesia, paralysis, loss of coordination, or loss of consciousness  Medication Review  HYDROcodone-acetaminophen, amLODipine, aspirin-acetaminophen-caffeine, cyclobenzaprine, gabapentin, hydrochlorothiazide, omeprazole, ondansetron, and potassium chloride SA  History Review  Allergy: [name] is allergic to protonix [pantoprazole]. Drug: [name]  reports that he does not currently use drugs after having used the following drugs: Marijuana. Alcohol:  reports no history of alcohol use. Tobacco:  reports that he has quit smoking. His smoking use included cigarettes. He has never used smokeless tobacco. Social: [name]  reports that he has quit smoking. His smoking use  included cigarettes. He has never used smokeless tobacco. He reports that he does not currently use drugs after having used the following drugs: Marijuana. He reports that he does not drink alcohol. Medical:  has a past medical history of Arthritis, Esophageal stricture, GERD (gastroesophageal reflux disease), History of MRSA infection (2016), Hypertension, Migraine, and Skin cancer, basal cell. Surgical: Mr. Shearn  has a past surgical history that includes Ankle arthroplasty; Esophagogastroduodenoscopy (N/A, 11/04/2018); and Inguinal hernia repair (Left, 09/26/2019). Family: family history includes Cancer in his father and mother.  Laboratory Chemistry Profile   Renal Lab Results  Component Value Date   BUN 15 11/08/2023   CREATININE 0.85 11/08/2023   GFRAA >60 10/10/2019   GFRNONAA >60 11/08/2023    Hepatic Lab Results  Component Value Date   AST 28 11/06/2023   ALT 22 11/06/2023   ALBUMIN 4.8 11/06/2023   ALKPHOS 76 11/06/2023   LIPASE 50 11/06/2023    Electrolytes Lab Results  Component Value Date   NA 136 11/08/2023   K 3.2 (L) 11/08/2023   CL 94 (L) 11/08/2023   CALCIUM 9.5 11/08/2023   MG 2.3 11/07/2023    Bone No results found for: "VD25OH", "VD125OH2TOT", "WU9811BJ4", "NW2956OZ3", "25OHVITD1", "25OHVITD2", "25OHVITD3", "TESTOFREE", "TESTOSTERONE"  Inflammation (CRP: Acute Phase) (ESR: Chronic Phase) Lab Results  Component Value Date   LATICACIDVEN 0.8 10/10/2019         Note: Above Lab results reviewed.  Recent Imaging Review  DG PAIN CLINIC C-ARM 1-60 MIN NO REPORT Fluoro was used, but no Radiologist interpretation will be provided.  Please refer to "NOTES" tab for provider progress note. Note: Reviewed        Physical Exam  General appearance: Well nourished, well developed, and well hydrated. In no apparent acute distress Mental status: Alert, oriented x 3 (person, place, & time)       Respiratory: No evidence of acute respiratory distress Eyes:  PERLA Vitals: There were no vitals taken for this visit. BMI: Estimated body mass index is 18.65 kg/m as calculated from the following:   Height as of 11/08/23: 5\' 10"  (1.778 m).   Weight as of 11/08/23: 130 lb (59 kg). Ideal: Patient weight not recorded  Assessment   Diagnosis Status  1. Cervicogenic headache (2ry area of Pain) (Right)   2. Chronic tension-type headache, intractable   3. Headache disorder   4. Cervicalgia (1ry area of Pain)   5. Cervical facet joint pain   6. Postop check    Controlled Controlled Controlled   Updated Problems: No problems updated.  Plan of Care  Problem-specific:  Assessment and Plan            Mr. Ayvion Kavanagh Clayborn has a current medication list which includes the following long-term medication(s): amlodipine, cyclobenzaprine, gabapentin, hydrochlorothiazide, hydrocodone-acetaminophen, and potassium chloride sa.  Pharmacotherapy (Medications Ordered): No orders of the defined types were placed in this encounter.  Orders:  No orders of the defined types were placed in this encounter.  Follow-up plan:   No follow-ups on file.      Interventional Therapies  Risk  Complexity Considerations:   Note: Difficult IV stick.   Planned  Pending:   Diagnostic/therapeutic bilateral (C3 + TON) cervical facet RFA #1    Under consideration:   Diagnostic left cervical facet C6-C8 RFA #1   Facet Joint Innervation  C1-2 Third occipital Nerve (TON)  C2-3 TON, C3  Medial Branch  C3-4 C3, C4         "          "  C4-5 C4, C5         "          "  C5-6 C5, C6         "          "  C6-7 C6, C7         "          "  C7-T1 C7, C8         "          "     Completed:   Therapeutic right C3-C8 cervical MB RFA x1 (11/23/2022) (100/100/100/97) Diagnostic bilateral cervical facet MBB x4 (10/19/2022) (100/100/35/35)  Diagnostic bilateral cervical facet C6-T1 MBB x1 (05/25/2022) (100/100/85/85)  Diagnostic right cervical facet C3-C5 MBB x1 (05/25/2022)  (100/100/85/85)  Diagnostic right cervical facet C3-C7 MBB x1 (04/25/2022) (100/100/60/100) (100-Neck/60-H/A)  Diagnostic bilateral cervical facet C7 & T1 MBB x1 (04/25/2022) (100/100/60/100) (100-Neck/60-H/A)  Diagnostic right Cervical ESI x1 (03/23/2022) (DOS: 10-10/10) (0/0/0/0) (patient indicated having gone to the ED x4 since procedure.)   Completed by other providers:   Therapeutic right C3 and C4 medial branch RFA x1 (07/30/2020) by Merri Ray, DO [facility])  Diagnostic right C3 and C4 MBB x2 (06/30/2020, 07/15/2020) by Merri Ray, DO [facility])  Diagnostic right C3-4 zygapophyseal joint (facet joint) inj. x1 (03/05/2020) by Merri Ray, DO Therapeutic  ACDF C4/5, C5/6, C6/7 (07/04/2022) by Hoyt Koch, MD ([facility]) (80% improvement)    Therapeutic  Palliative (PRN) options:   None established   Pharmacotherapy:  Nonopioid transferred (10/02/2022): Gabapentin (Neurontin) 300 mg, 3 tabs p.o. at bedtime; Flexeril 10  mg, 1 tab p.o. at bedtime. I believe this regimen to be appropriate for this patient however, we no longer have the manpower to continue managing medications on these patients and therefore I have kindly request that for his PCP to consider taking over it.  The patient was informed that should they decide not to, we do not plan on continuing with this regimen because of the above reasons. Recommendations:   None at this time.      Recent Visits Date Type Provider Dept  10/23/23 Office Visit Delano Metz, MD [facility]  10/04/23 Procedure visit Delano Metz, MD [facility]  Showing recent visits within past 90 days and meeting all other requirements Future Appointments Date Type Provider Dept  12/27/23 Appointment Delano Metz, MD [facility]  Showing future appointments within next 90 days and meeting all other requirements  I discussed the assessment and treatment plan with the patient. The patient was  provided an opportunity to ask questions and all were answered. The patient agreed with the plan and demonstrated an understanding of the instructions.  Patient advised to call back or seek an in-person evaluation if the symptoms or condition worsens.  Duration of encounter: *** minutes.  Total time on encounter, as per AMA guidelines included both the face-to-face and non-face-to-face time personally spent by the physician and/or other qualified health care professional(s) on the day of the encounter (includes time in activities that require the physician or other qualified health care professional and does not include time in activities normally performed by clinical staff). Physician's time may include the following activities when performed: Preparing to see the patient (e.g., pre-charting review of records, searching for previously ordered imaging, lab work, and nerve conduction tests) Review of prior analgesic pharmacotherapies. Reviewing PMP Interpreting ordered tests (e.g., lab work, imaging, nerve conduction tests) Performing post-procedure evaluations, including interpretation of diagnostic procedures Obtaining and/or reviewing separately obtained history Performing a medically appropriate examination and/or evaluation Counseling and educating the patient/family/caregiver Ordering medications, tests, or procedures Referring and communicating with other health care professionals (when not separately reported) Documenting clinical information in the electronic or other health record Independently interpreting results (not separately reported) and communicating results to the patient/ family/caregiver Care coordination (not separately reported)  Note by: Oswaldo Done, MD Date: 12/27/2023; Time: 10:51 AM

## 2023-12-27 ENCOUNTER — Ambulatory Visit (HOSPITAL_BASED_OUTPATIENT_CLINIC_OR_DEPARTMENT_OTHER): Admitting: Pain Medicine

## 2023-12-27 DIAGNOSIS — M542 Cervicalgia: Secondary | ICD-10-CM

## 2023-12-27 DIAGNOSIS — G8929 Other chronic pain: Secondary | ICD-10-CM

## 2023-12-27 DIAGNOSIS — G4486 Cervicogenic headache: Secondary | ICD-10-CM

## 2023-12-27 DIAGNOSIS — Z91199 Patient's noncompliance with other medical treatment and regimen due to unspecified reason: Secondary | ICD-10-CM

## 2023-12-27 DIAGNOSIS — G44221 Chronic tension-type headache, intractable: Secondary | ICD-10-CM

## 2023-12-27 DIAGNOSIS — R519 Headache, unspecified: Secondary | ICD-10-CM

## 2023-12-27 DIAGNOSIS — Z09 Encounter for follow-up examination after completed treatment for conditions other than malignant neoplasm: Secondary | ICD-10-CM

## 2023-12-31 NOTE — Progress Notes (Unsigned)
 PROVIDER NOTE: Information contained herein reflects review and annotations entered in association with encounter. Interpretation of such information and data should be left to medically-trained personnel. Information provided to patient can be located elsewhere in the medical record under "Patient Instructions". Document created using STT-dictation technology, any transcriptional errors that may result from process are unintentional.    Patient: Johnathan Harvey  Service Category: E/M  Provider: Oswaldo Done, MD  DOB: 1955/12/18  DOS: 01/01/2024  Referring Provider: Danella Penton, MD  MRN: [id number]  Specialty: Interventional Pain Management  PCP: Danella Penton, MD  Type: Established Patient  Setting: Ambulatory outpatient    Location: Office  Delivery: Face-to-face     HPI  [name], a 68 y.o. year old male, is here today because of his Cervicogenic headache [G44.86]. [name]'s primary complain today is No chief complaint on file.  Pertinent problems: 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); Cervical facet arthropathy (Multilevel) (Bilateral); DDD (degenerative disc disease), cervical; Cervical foraminal stenosis (Bilateral: C6-7) (Right: C4-5) (Left: C5-6); Cervicogenic headache (2ry area of Pain) (Right); Cervical facet syndrome; Spondylosis without myelopathy or radiculopathy, cervical region; Occipital headache (Right); Neck pain of over 3 months duration; Cervical neck pain with evidence of disc disease; Chronic neck and back pain; Musculoskeletal disorder involving upper trapezius muscle (Right); Cervical facet hypertrophy (Multilevel) (Bilateral);  Postoperative pain after spinal surgery; Neck pain with history of cervical spinal surgery; Spinal stenosis in cervical region; and Cervical facet joint pain on their pertinent problem list. Pain Assessment: Severity of   is reported as a  /10. Location:    / . Onset:  . Quality:  . Timing:  . Modifying factor(s):  Marland Kitchen Vitals:  vitals were not taken for this visit.  BMI: Estimated body mass index is 18.65 kg/m as calculated from the following:   Height as of 11/08/23: 5\' 10"  (1.778 m).   Weight as of 11/08/23: 130 lb (59 kg). Last encounter: 12/27/2023. Last procedure: 10/04/2023.  Reason for encounter: evaluation of worsening, or previously known (established) problem. ***  Discussed the use of AI scribe software for clinical note transcription with the patient, who gave verbal consent to proceed.  History of Present Illness           Pharmacotherapy Assessment  Analgesic: No chronic opioid analgesics therapy prescribed by our practice.  Oxycodone/APAP 5/325 (# 20) 1 4 times daily (last filled on 08/31/2022) prescribed by Ander Gaster, MD  MME/day: 30 mg/day   Monitoring: [place] PMP: PDMP reviewed during this encounter.       Pharmacotherapy: No side-effects or adverse reactions reported. Compliance: No problems identified. Effectiveness: Clinically acceptable.  No notes on file  No results found for: "CBDTHCR" No results found for: "D8THCCBX" No results found for: "D9THCCBX"  UDS:  No results found for: "SUMMARY"    ROS  Constitutional: Denies any fever or chills Gastrointestinal: No reported hemesis, hematochezia, vomiting, or acute GI distress Musculoskeletal: Denies any acute onset joint swelling, redness, loss of ROM, or weakness Neurological: No reported episodes of acute onset apraxia, aphasia, dysarthria, agnosia, amnesia, paralysis, loss of coordination, or loss of consciousness  Medication Review  HYDROcodone-acetaminophen, amLODipine,  aspirin-acetaminophen-caffeine, cyclobenzaprine, gabapentin, hydrochlorothiazide, omeprazole, ondansetron, and potassium chloride SA  History Review  Allergy: [name] is allergic to protonix [pantoprazole]. Drug: [name]  reports that he does not currently use drugs after having used the following drugs: Marijuana. Alcohol:  reports no history of alcohol use. Tobacco:  reports that he has quit smoking. His smoking use included cigarettes. He has never used smokeless tobacco. Social: Johnathan Harvey  reports that he has quit smoking. His smoking use included cigarettes. He has never used smokeless tobacco. He reports that he does not currently use drugs after having used the following drugs: Marijuana. He reports that he does not drink alcohol. Medical:  has a past medical history of Arthritis, Esophageal stricture, GERD (gastroesophageal reflux disease), History of MRSA infection (2016), Hypertension, Migraine, and Skin cancer, basal cell. Surgical: Johnathan Harvey  has a past surgical history that includes Ankle arthroplasty; Esophagogastroduodenoscopy (N/A, 11/04/2018); and Inguinal hernia repair (Left, 09/26/2019). Family: family history includes Cancer in his father and mother.  Laboratory Chemistry Profile   Renal Lab Results  Component Value Date   BUN 15 11/08/2023   CREATININE 0.85 11/08/2023   GFRAA >60 10/10/2019   GFRNONAA >60 11/08/2023    Hepatic Lab Results  Component Value Date   AST 28 11/06/2023   ALT 22 11/06/2023   ALBUMIN 4.8 11/06/2023   ALKPHOS 76 11/06/2023   LIPASE 50 11/06/2023    Electrolytes Lab Results  Component Value Date   NA 136 11/08/2023   K 3.2 (L) 11/08/2023   CL 94 (L) 11/08/2023   CALCIUM 9.5 11/08/2023   MG 2.3 11/07/2023    Bone No results found for: "VD25OH", "VD125OH2TOT", "EX5284XL2", "GM0102VO5", "25OHVITD1", "25OHVITD2", "25OHVITD3", "TESTOFREE", "TESTOSTERONE"  Inflammation (CRP: Acute Phase) (ESR: Chronic Phase) Lab  Results  Component Value Date   LATICACIDVEN 0.8 10/10/2019         Note: Above Lab results reviewed.  Recent Imaging Review  DG PAIN CLINIC C-ARM 1-60 MIN NO REPORT Fluoro was used, but no Radiologist interpretation will be provided.  Please refer to "NOTES" tab for provider progress note. Note: Reviewed        Physical Exam  General appearance: Well nourished, well developed, and well hydrated. In no apparent acute distress Mental status: Alert, oriented x 3 (person, place, & time)       Respiratory: No evidence of acute respiratory distress Eyes: PERLA Vitals: There were no vitals taken for this visit. BMI: Estimated body mass index is 18.65 kg/m as calculated from the following:   Height as of 11/08/23: 5\' 10"  (1.778 m).   Weight as of 11/08/23: 130 lb (59 kg). Ideal: Patient weight not recorded  Assessment   Diagnosis Status  1. Cervicogenic headache (2ry area of Pain) (Right)   2. Chronic tension-type headache, intractable   3. Headache disorder   4. Cervicalgia (1ry area of Pain)   5. Cervical facet joint pain   6. Cervical facet syndrome   7. Cervical facet hypertrophy (Multilevel) (Bilateral)   8. Abnormal MRI, cervical spine (09/28/2021)   9. Neck pain of over 3 months duration   10. Neck pain with history of cervical spinal surgery   11. Postoperative pain after spinal surgery   12. Spinal stenosis in cervical region    Controlled Controlled Controlled   Updated Problems: No problems updated.  Plan of Care  Problem-specific:  Assessment and Plan            [name] has a current medication list which includes the following long-term medication(s): amlodipine, cyclobenzaprine, gabapentin, hydrochlorothiazide, hydrocodone-acetaminophen, and  potassium chloride sa.  Pharmacotherapy (Medications Ordered): No orders of the defined types were placed in this encounter.  Orders:  No orders of the defined types were placed in this  encounter.  Follow-up plan:   No follow-ups on file.      Interventional Therapies  Risk  Complexity Considerations:   Note: Difficult IV stick.   Planned  Pending:      Under consideration:   Repeat cervical MRI due to persistent pain despite interventional treatments and to evaluate patency of canal for possible spinal cord stimulator trial.   Completed:   Diagnostic/therapeutic bilateral (C3 + TON) cervical facet RFA x1 (10/04/2023) (100/100/0/0) Therapeutic right C3-C8 cervical MB RFA x1 (11/23/2022) (100/100/100/97) Diagnostic bilateral cervical facet MBB x4 (10/19/2022) (100/100/35/35)  Diagnostic bilateral cervical facet C6-T1 MBB x1 (05/25/2022) (100/100/85/85)  Diagnostic right cervical facet C3-C5 MBB x1 (05/25/2022) (100/100/85/85)  Diagnostic right cervical facet C3-C7 MBB x1 (04/25/2022) (100/100/60/100) (100-Neck/60-H/A)  Diagnostic bilateral cervical facet C7 & T1 MBB x1 (04/25/2022) (100/100/60/100) (100-Neck/60-H/A)  Diagnostic right Cervical ESI x1 (03/23/2022) (DOS: 10-10/10) (0/0/0/0) (patient indicated having gone to the ED x4 since procedure.)   Completed by other providers:   Therapeutic right C3 and C4 medial branch RFA x1 (07/30/2020) by Merri Ray, DO [facility])  Diagnostic right C3 and C4 MBB x2 (06/30/2020, 07/15/2020) by Merri Ray, DO [facility])  Diagnostic right C3-4 zygapophyseal joint (facet joint) inj. x1 (03/05/2020) by Merri Ray, DO Therapeutic  ACDF C4/5, C5/6, C6/7 (07/04/2022) by Hoyt Koch, MD ([facility]) (80% improvement)    Therapeutic  Palliative (PRN) options:   None established   Pharmacotherapy:  Nonopioid transferred (10/02/2022): Gabapentin (Neurontin) 300 mg, 3 tabs p.o. at bedtime; Flexeril 10 mg, 1 tab p.o. at bedtime. I believe this regimen to be appropriate for this patient however, we no longer have the manpower to continue managing medications on these patients and therefore I have kindly request that for his PCP to  consider taking over it.  The patient was informed that should they decide not to, we do not plan on continuing with this regimen because of the above reasons. Recommendations:   None at this time.     Recent Visits Date Type Provider Dept  10/23/23 Office Visit Delano Metz, MD [facility]  10/04/23 Procedure visit Delano Metz, MD [facility]  Showing recent visits within past 90 days and meeting all other requirements Future Appointments Date Type Provider Dept  01/01/24 Appointment Delano Metz, MD [facility]  Showing future appointments within next 90 days and meeting all other requirements  I discussed the assessment and treatment plan with the patient. The patient was provided an opportunity to ask questions and all were answered. The patient agreed with the plan and demonstrated an understanding of the instructions.  Patient advised to call back or seek an in-person evaluation if the symptoms or condition worsens.  Duration of encounter: *** minutes.  Total time on encounter, as per AMA guidelines included both the face-to-face and non-face-to-face time personally spent by the physician and/or other qualified health care professional(s) on the day of the encounter (includes time in activities that require the physician or other qualified health care professional and does not include time in activities normally performed by clinical staff). Physician's time may include the following activities when performed: Preparing to see the patient (e.g., pre-charting review of records, searching for previously ordered imaging, lab work, and nerve conduction tests) Review of prior analgesic pharmacotherapies. Reviewing PMP Interpreting ordered  tests (e.g., lab work, imaging, nerve conduction tests) Performing post-procedure evaluations, including interpretation of diagnostic procedures Obtaining and/or reviewing separately obtained  history Performing a medically appropriate examination and/or evaluation Counseling and educating the patient/family/caregiver Ordering medications, tests, or procedures Referring and communicating with other health care professionals (when not separately reported) Documenting clinical information in the electronic or other health record Independently interpreting results (not separately reported) and communicating results to the patient/ family/caregiver Care coordination (not separately reported)  Note by: Oswaldo Done, MD Date: 01/01/2024; Time: 4:21 PM

## 2024-01-01 ENCOUNTER — Ambulatory Visit: Attending: Pain Medicine | Admitting: Pain Medicine

## 2024-01-01 ENCOUNTER — Encounter: Payer: Self-pay | Admitting: Pain Medicine

## 2024-01-01 VITALS — BP 120/78 | HR 76 | Temp 98.1°F | Resp 16 | Ht 70.0 in | Wt 130.0 lb

## 2024-01-01 DIAGNOSIS — R937 Abnormal findings on diagnostic imaging of other parts of musculoskeletal system: Secondary | ICD-10-CM | POA: Diagnosis not present

## 2024-01-01 DIAGNOSIS — G4486 Cervicogenic headache: Secondary | ICD-10-CM | POA: Insufficient documentation

## 2024-01-01 DIAGNOSIS — M4802 Spinal stenosis, cervical region: Secondary | ICD-10-CM | POA: Diagnosis not present

## 2024-01-01 DIAGNOSIS — G44221 Chronic tension-type headache, intractable: Secondary | ICD-10-CM | POA: Insufficient documentation

## 2024-01-01 DIAGNOSIS — G8918 Other acute postprocedural pain: Secondary | ICD-10-CM | POA: Diagnosis not present

## 2024-01-01 DIAGNOSIS — R519 Headache, unspecified: Secondary | ICD-10-CM | POA: Diagnosis present

## 2024-01-01 DIAGNOSIS — M47812 Spondylosis without myelopathy or radiculopathy, cervical region: Secondary | ICD-10-CM | POA: Diagnosis not present

## 2024-01-01 DIAGNOSIS — M542 Cervicalgia: Secondary | ICD-10-CM | POA: Insufficient documentation

## 2024-01-01 DIAGNOSIS — Z9889 Other specified postprocedural states: Secondary | ICD-10-CM | POA: Diagnosis not present

## 2024-01-01 NOTE — Progress Notes (Signed)
 Safety precautions to be maintained throughout the outpatient stay will include: orient to surroundings, keep bed in low position, maintain call bell within reach at all times, provide assistance with transfer out of bed and ambulation.

## 2024-01-09 DIAGNOSIS — M542 Cervicalgia: Secondary | ICD-10-CM | POA: Diagnosis not present

## 2024-01-09 DIAGNOSIS — E041 Nontoxic single thyroid nodule: Secondary | ICD-10-CM | POA: Diagnosis not present

## 2024-01-09 DIAGNOSIS — G479 Sleep disorder, unspecified: Secondary | ICD-10-CM | POA: Diagnosis not present

## 2024-01-09 DIAGNOSIS — G4486 Cervicogenic headache: Secondary | ICD-10-CM | POA: Diagnosis not present

## 2024-01-09 DIAGNOSIS — R0683 Snoring: Secondary | ICD-10-CM | POA: Diagnosis not present

## 2024-01-09 DIAGNOSIS — R51 Headache with orthostatic component, not elsewhere classified: Secondary | ICD-10-CM | POA: Diagnosis not present

## 2024-01-14 DIAGNOSIS — G4733 Obstructive sleep apnea (adult) (pediatric): Secondary | ICD-10-CM | POA: Diagnosis not present

## 2024-01-17 DIAGNOSIS — E041 Nontoxic single thyroid nodule: Secondary | ICD-10-CM | POA: Diagnosis not present

## 2024-01-24 DIAGNOSIS — I10 Essential (primary) hypertension: Secondary | ICD-10-CM | POA: Diagnosis not present

## 2024-01-24 DIAGNOSIS — Z0000 Encounter for general adult medical examination without abnormal findings: Secondary | ICD-10-CM | POA: Diagnosis not present

## 2024-01-24 DIAGNOSIS — Z79899 Other long term (current) drug therapy: Secondary | ICD-10-CM | POA: Diagnosis not present

## 2024-01-24 DIAGNOSIS — R739 Hyperglycemia, unspecified: Secondary | ICD-10-CM | POA: Diagnosis not present

## 2024-01-24 DIAGNOSIS — G4733 Obstructive sleep apnea (adult) (pediatric): Secondary | ICD-10-CM | POA: Diagnosis not present

## 2024-01-24 DIAGNOSIS — Z125 Encounter for screening for malignant neoplasm of prostate: Secondary | ICD-10-CM | POA: Diagnosis not present

## 2024-01-24 DIAGNOSIS — M5090 Cervical disc disorder, unspecified, unspecified cervical region: Secondary | ICD-10-CM | POA: Diagnosis not present

## 2024-02-05 IMAGING — CT CT HEAD W/O CM
4 series · 16 of 47 positions shown, 18 images · non-contrast
Comparison: CT head 12/07/2020

CLINICAL DATA: Headache, new or worsening (Age >= 50y)



[Series 2: head wo · axial · 0.43mm/px · z∈[+272,+392]mm · 7 of 32 slices shown, 9 images]
[im 4/32  brain]
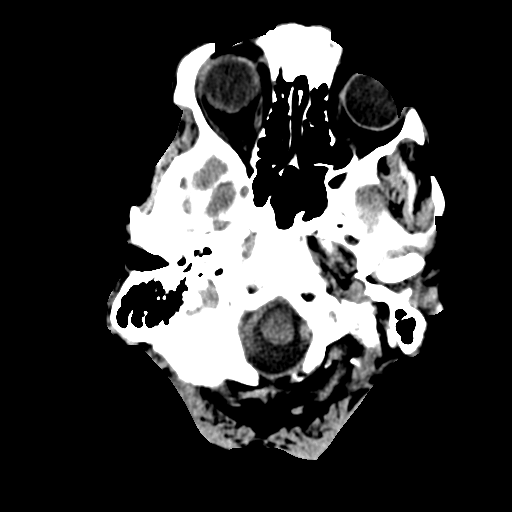
[im 4/32  bone]
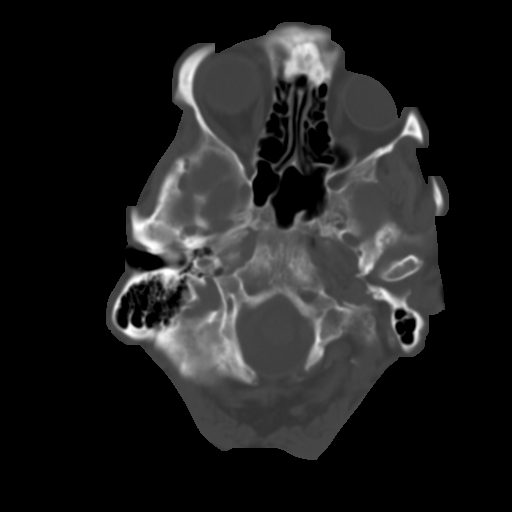
[im 8/32  brain]
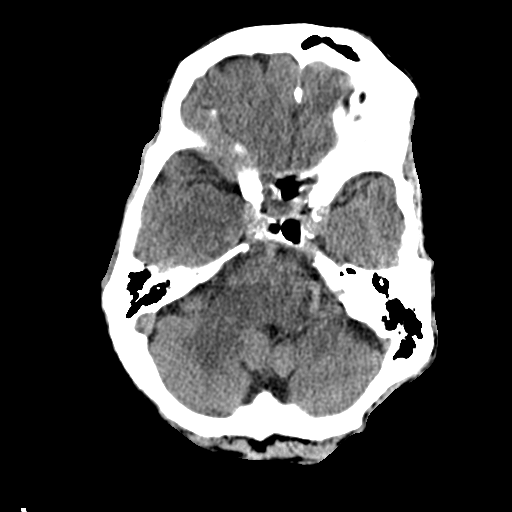
[im 12/32  brain]
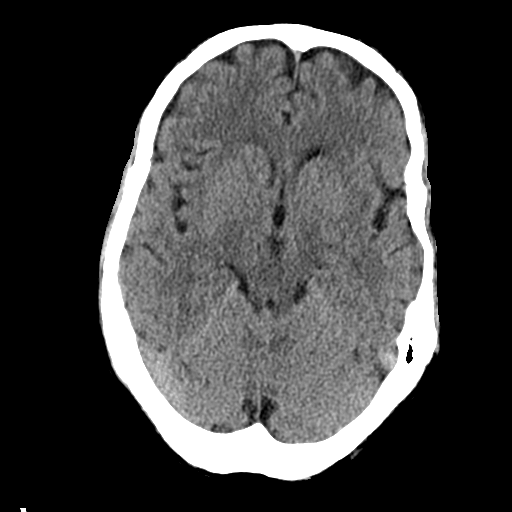
[im 16/32  brain]
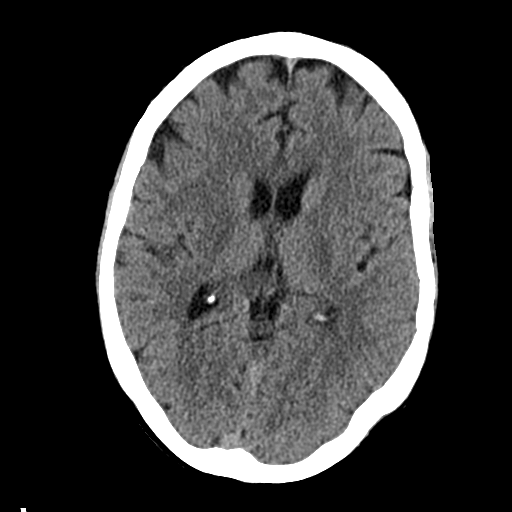
[im 20/32  brain]
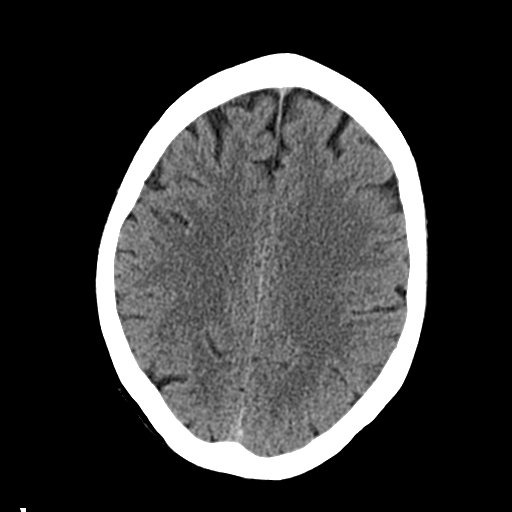
[im 20/32  bone]
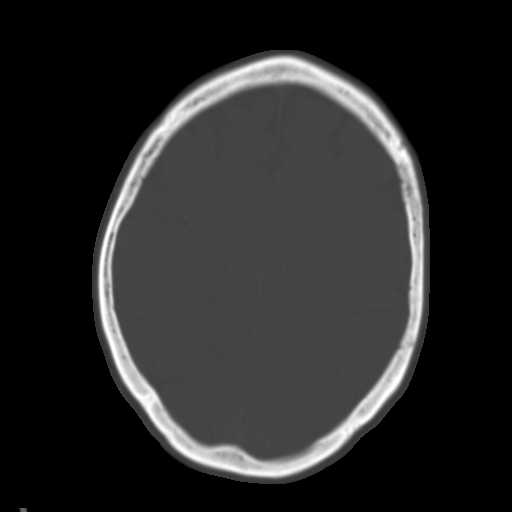
[im 24/32  brain]
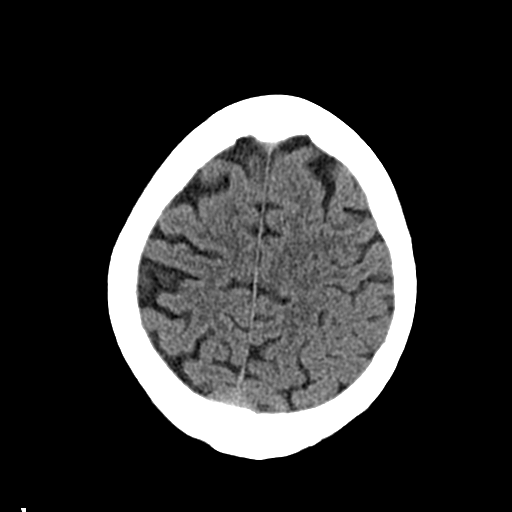
[im 28/32  brain]
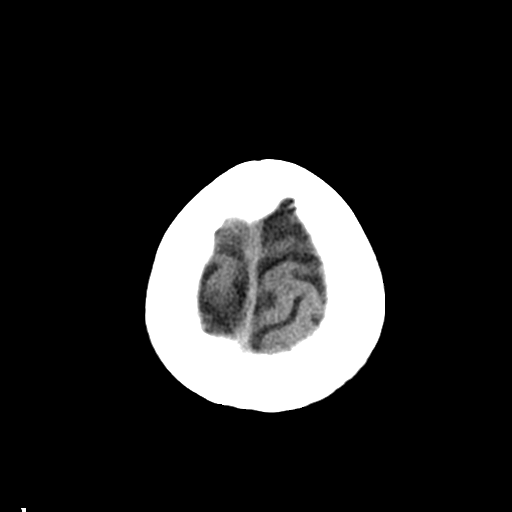

[Series 3: head bone · axial · 0.43mm/px · z∈[+271,+303]mm · 3 of 79 slices shown]
[im 8/79  bone]
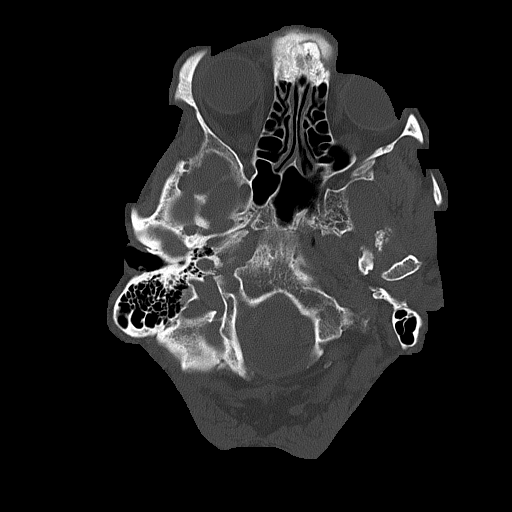
[im 16/79  bone]
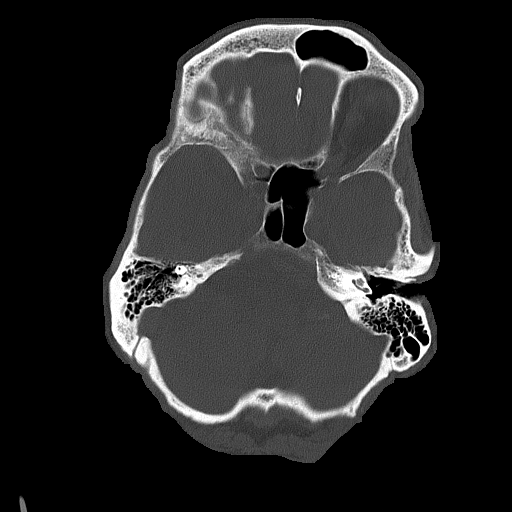
[im 24/79  bone]
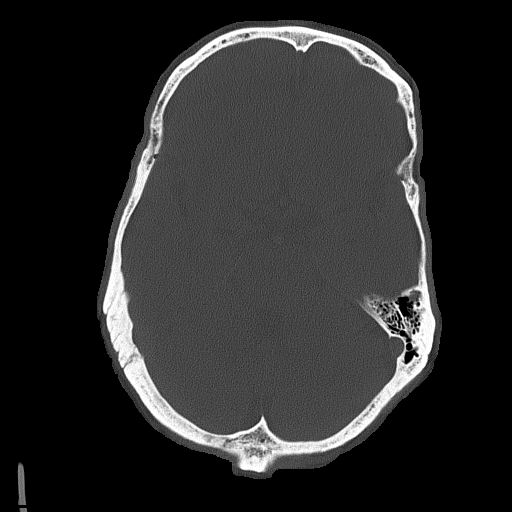

[Series 4: coronal soft tissue · coronal · 0.31mm/px · 3 of 70 slices shown]
[im 24/70  brain]
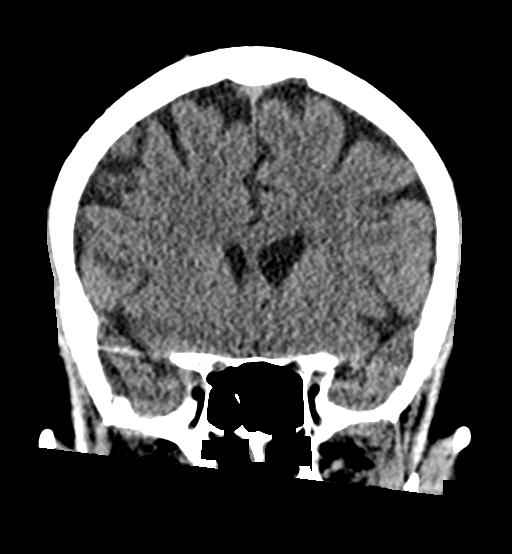
[im 31/70  brain]
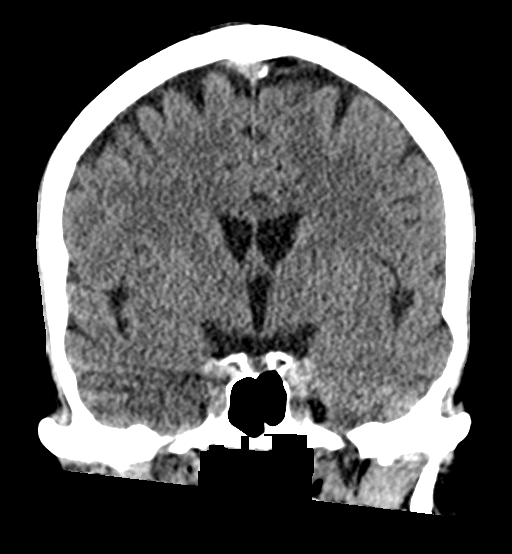
[im 39/70  brain]
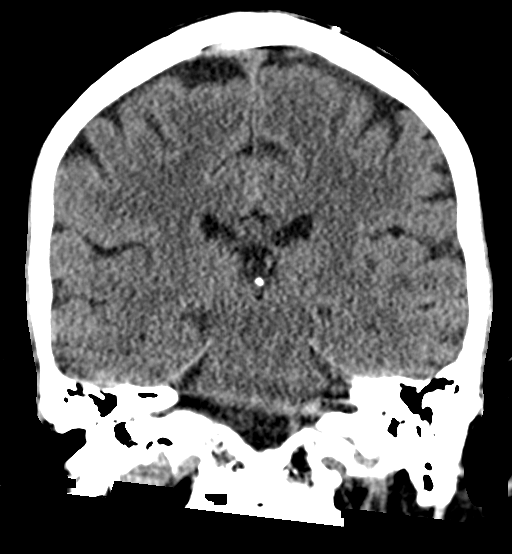

[Series 5: sagittal soft tissue · sagittal · 0.34mm/px · 3 of 50 slices shown]
[im 17/50  brain]
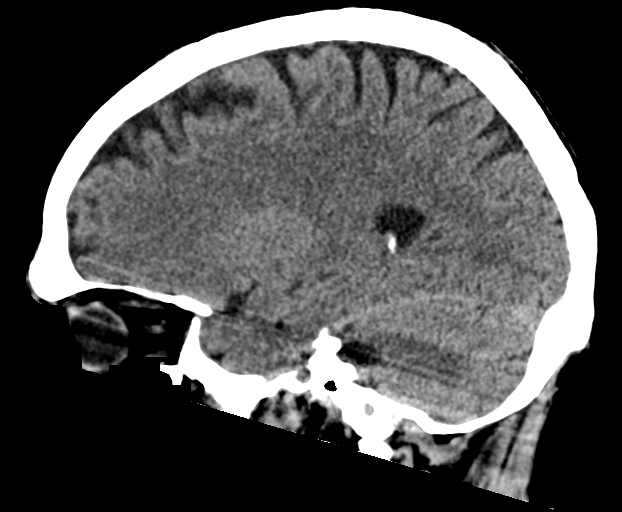
[im 25/50  brain]
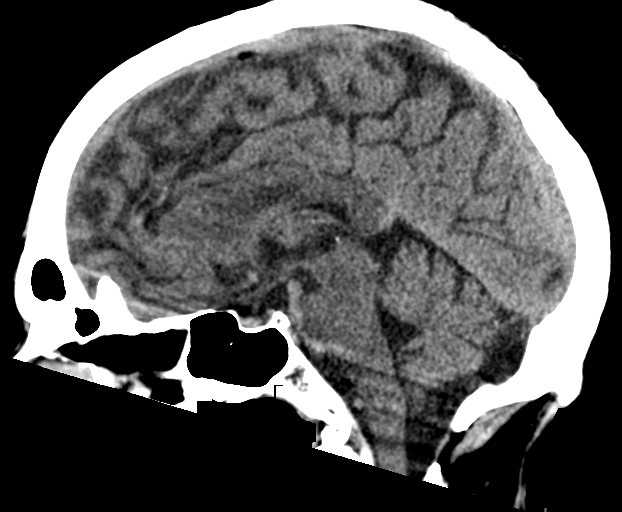
[im 33/50  brain]
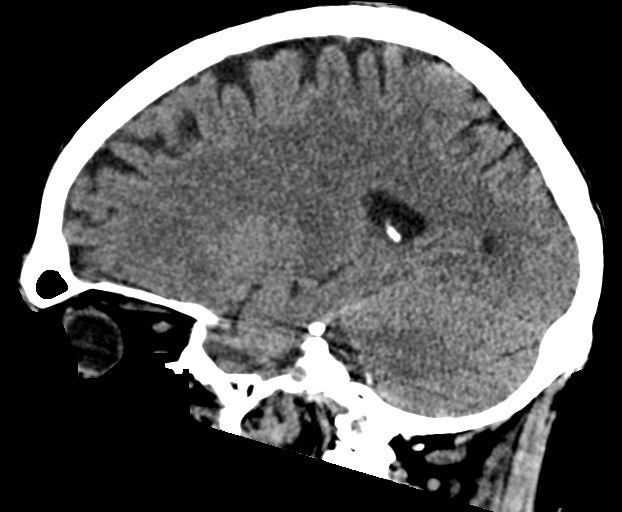

[16 of 47 positions shown; findings below may reference images not displayed]

BRAIN:
BRAIN
Trace patchy and confluent areas of decreased attenuation are noted
throughout the deep and periventricular white matter of the cerebral
hemispheres bilaterally, compatible with chronic microvascular
ischemic disease.

No evidence of large-territorial acute infarction. No parenchymal
hemorrhage. No mass lesion. No extra-axial collection.

No mass effect or midline shift. No hydrocephalus. Basilar cisterns
are patent.

Vascular: No hyperdense vessel.

Skull: No acute fracture or focal lesion.

Sinuses/Orbits: Paranasal sinuses and mastoid air cells are clear.
Right lens replacement. Otherwise the orbits are unremarkable.

Other: None.
IMPRESSION: No acute intracranial abnormality.

## 2024-02-05 IMAGING — CT CT ABD-PELV W/ CM
2 of 5 series · 15 of 46 positions shown, 17 images · IV contrast (APPLIED)
Comparison: 03/11/2021, 03/15/2021

CLINICAL DATA: Nausea and vomiting

EXAM:
CT ABDOMEN AND PELVIS WITH CONTRAST
TECHNIQUE: Multidetector CT imaging of the abdomen and pelvis was performed
using the standard protocol following bolus administration of
intravenous contrast.

[Series 2: routine abd/pel with · axial · 0.69mm/px · z∈[-457,-97]mm · 12 of 82 slices shown, 14 images]
[im 5/82  soft-tissue]
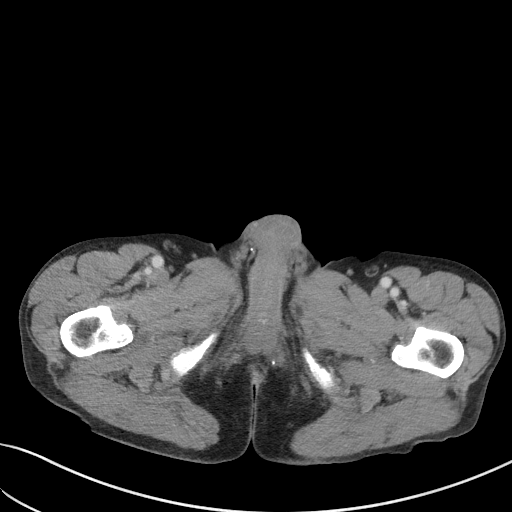
[im 5/82  bone]
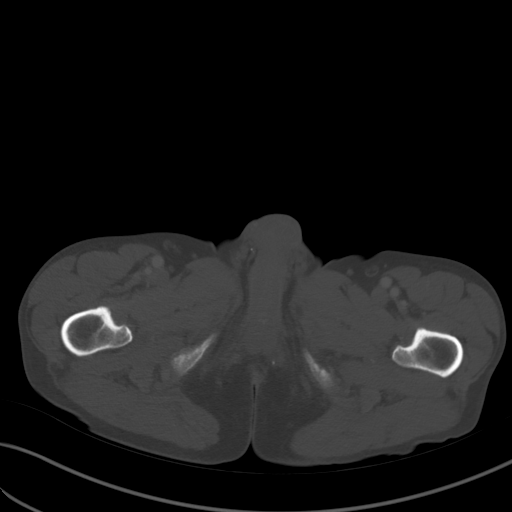
[im 14/82  soft-tissue]
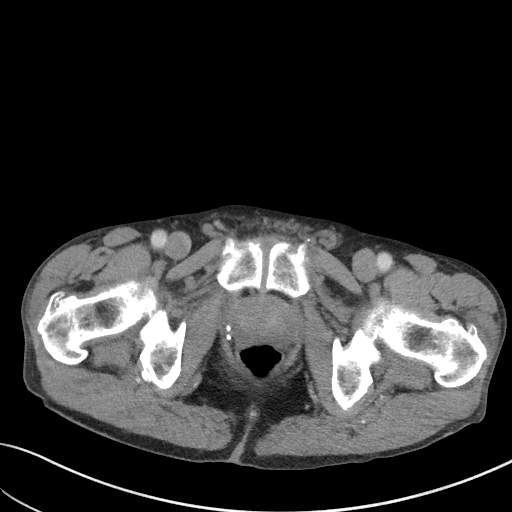
[im 19/82  soft-tissue]
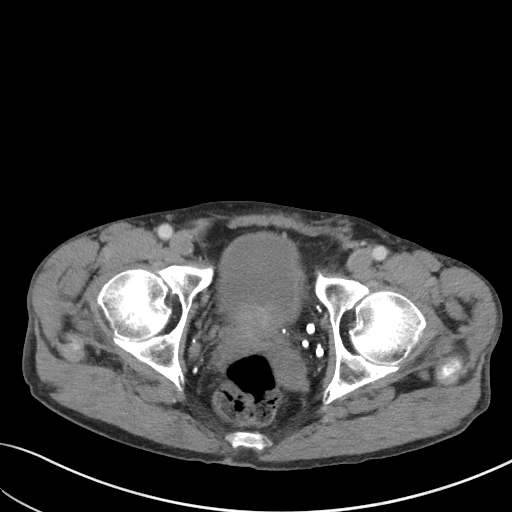
[im 23/82  soft-tissue]
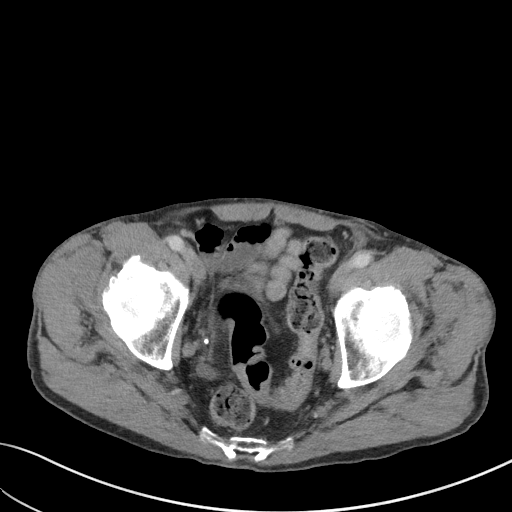
[im 32/82  soft-tissue]
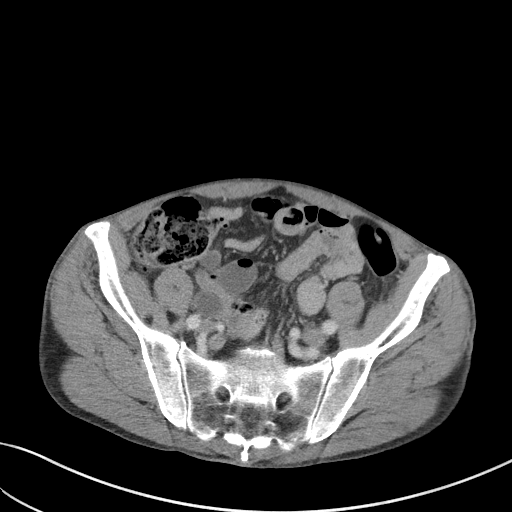
[im 37/82  soft-tissue]
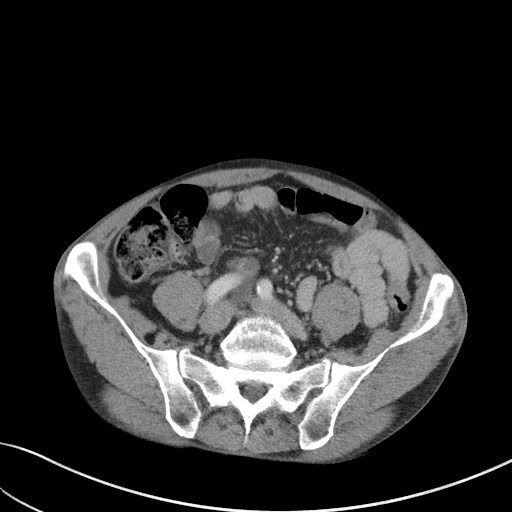
[im 46/82  soft-tissue]
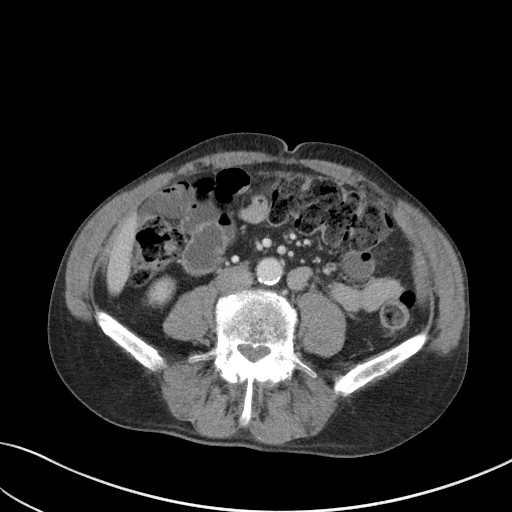
[im 50/82  soft-tissue]
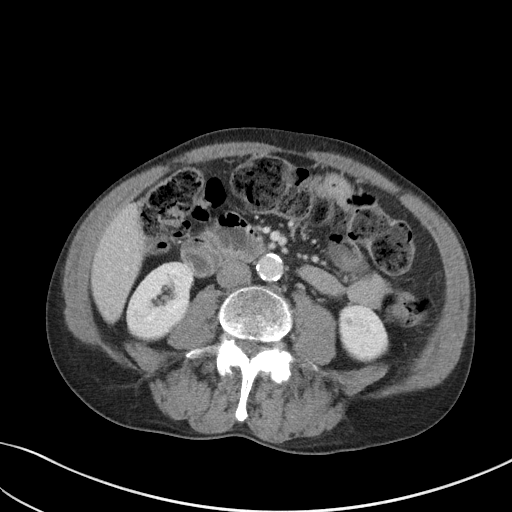
[im 59/82  soft-tissue]
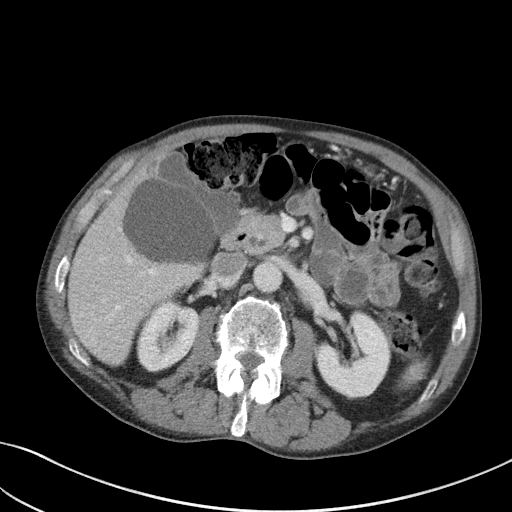
[im 59/82  bone]
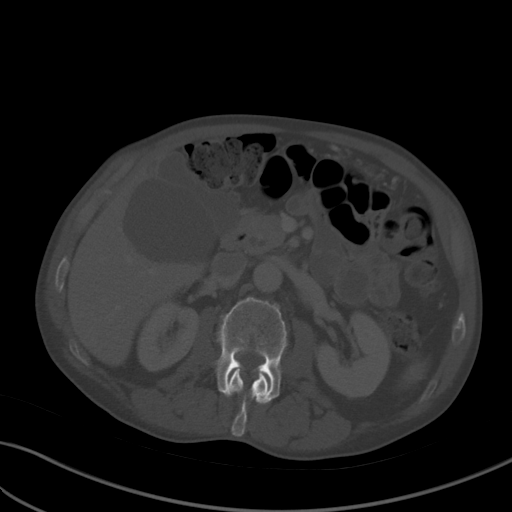
[im 64/82  soft-tissue]
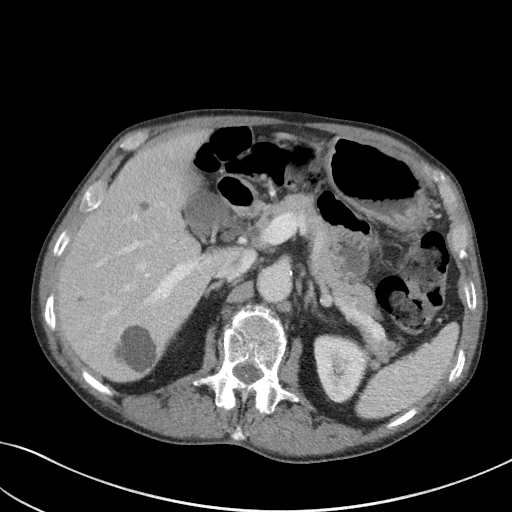
[im 68/82  soft-tissue]
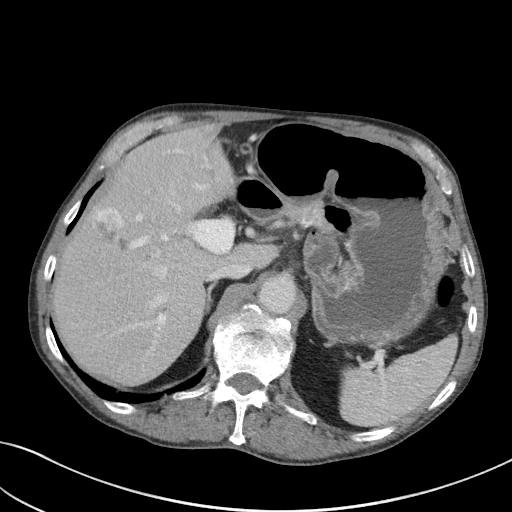
[im 77/82  soft-tissue]
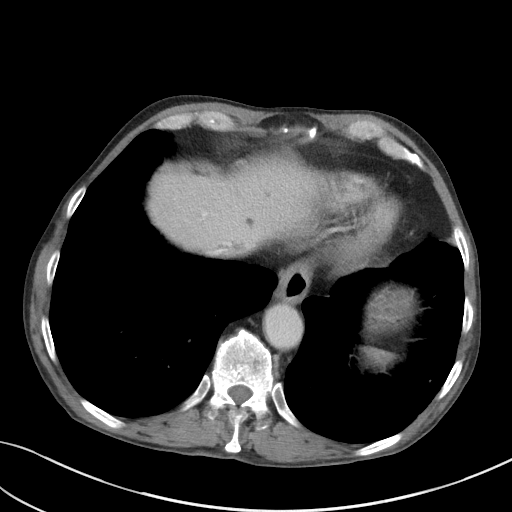

[Series 5: coronal st · coronal · 0.66mm/px · 3 of 84 slices shown]
[im 28/84  soft-tissue]
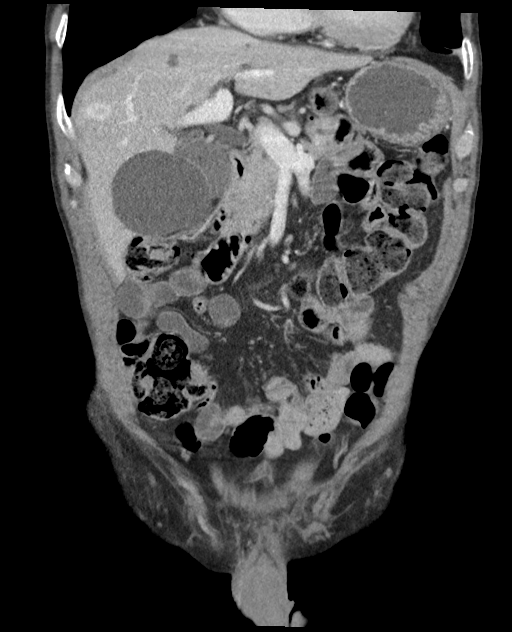
[im 37/84  soft-tissue]
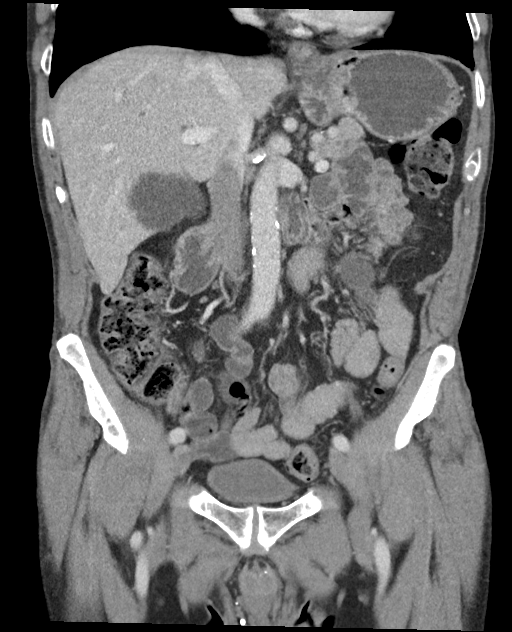
[im 47/84  soft-tissue]
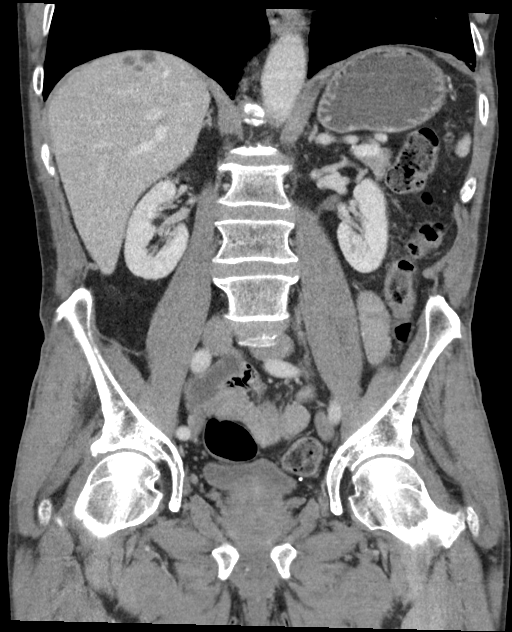

[15 of 46 positions shown; findings below may reference images not displayed]

RADIATION DOSE REDUCTION: This exam was performed according to the
departmental dose-optimization program which includes automated
exposure control, adjustment of the mA and/or kV according to
patient size and/or use of iterative reconstruction technique.

CONTRAST:  80mL OMNIPAQUE IOHEXOL 300 MG/ML  SOLN
FINDINGS: Lower chest: No acute abnormality.

Hepatobiliary: Multiple hepatic cysts, largest within the inferior
right hepatic lobe measuring up to 7.2 cm. Additional hepatic
hemangiomas with discontinuous peripheral nodular enhancement and
progressive fill in on delayed phase images, similar in size and
appearance compared to the previous studies. Unremarkable
gallbladder. No hyperdense gallstone. No biliary dilatation.

Pancreas: Unremarkable. No pancreatic ductal dilatation or
surrounding inflammatory changes.

Spleen: Normal in size without focal abnormality.

Adrenals/Urinary Tract: Unremarkable adrenal glands. Kidneys enhance
symmetrically without focal lesion, stone, or hydronephrosis.
Ureters are nondilated. Urinary bladder appears unremarkable for the
degree of distension.

Stomach/Bowel: Mildly thickened appearance of the distal esophagus.
Stomach is mildly distended but appears otherwise within normal
limits. No dilated loops of bowel to suggest obstruction. Moderate
volume of stool throughout the colon. Normal appendix in the right
lower quadrant (series 5, image 28). No focal bowel wall thickening
or inflammatory changes.

Vascular/Lymphatic: Aortic atherosclerosis. No enlarged abdominal or
pelvic lymph nodes.

Reproductive: Mildly enlarged prostate gland.

Other: No free fluid. No abdominopelvic fluid collection. No
pneumoperitoneum. No abdominal wall hernia.

Musculoskeletal: No new or acute bony findings. Facet predominant
lumbar spondylosis. Ovoid low-density structure within the
superficial aspect of the distal left gluteus medius muscle (series
2, image 56), stable in size and appearance from previous CT.
IMPRESSION: 1. No acute abdominopelvic findings.
2. Mildly thickened appearance of the distal esophagus, which may
represent esophagitis.
3. Moderate volume of stool throughout the colon.
4. Ovoid low-density structure within the superficial aspect of the
distal left gluteus medius muscle, stable in size and appearance for
greater than 1 year. Finding is of doubtful clinical significance.
If there is focal pain referable to this location, consider non
emergent MRI to further assess.
5. Aortic Atherosclerosis ([id number]).

## 2024-02-21 ENCOUNTER — Emergency Department

## 2024-02-21 ENCOUNTER — Other Ambulatory Visit: Payer: Self-pay

## 2024-02-21 ENCOUNTER — Emergency Department
Admission: EM | Admit: 2024-02-21 | Discharge: 2024-02-21 | Discharge status: Against Medical Advice | Attending: Emergency Medicine | Admitting: Emergency Medicine

## 2024-02-21 DIAGNOSIS — R0789 Other chest pain: Secondary | ICD-10-CM | POA: Diagnosis not present

## 2024-02-21 DIAGNOSIS — Z5321 Procedure and treatment not carried out due to patient leaving prior to being seen by health care provider: Secondary | ICD-10-CM | POA: Insufficient documentation

## 2024-02-21 DIAGNOSIS — R002 Palpitations: Secondary | ICD-10-CM | POA: Insufficient documentation

## 2024-02-21 LAB — BASIC METABOLIC PANEL WITH GFR
Anion gap: 12 (ref 5–15)
BUN: 12 mg/dL (ref 8–23)
CO2: 23 mmol/L (ref 22–32)
Calcium: 8.7 mg/dL — ABNORMAL LOW (ref 8.9–10.3)
Chloride: 105 mmol/L (ref 98–111)
Creatinine, Ser: 0.97 mg/dL (ref 0.61–1.24)
GFR, Estimated: >60 mL/min (ref >60)
Glucose, Bld: 107 mg/dL — ABNORMAL HIGH (ref 70–99)
Potassium: 3.4 mmol/L — ABNORMAL LOW (ref 3.5–5.1)
Sodium: 140 mmol/L (ref 135–145)

## 2024-02-21 LAB — CBC
HCT: 38 % — ABNORMAL LOW (ref 39.0–52.0)
Hemoglobin: 13.1 g/dL (ref 13.0–17.0)
MCH: 31.6 pg (ref 26.0–34.0)
MCHC: 34.5 g/dL (ref 30.0–36.0)
MCV: 91.6 fL (ref 80.0–100.0)
Platelets: 308 10*3/uL (ref 150–400)
RBC: 4.15 MIL/uL — ABNORMAL LOW (ref 4.22–5.81)
RDW: 12.6 % (ref 11.5–15.5)
WBC: 6.3 10*3/uL (ref 4.0–10.5)
nRBC: 0 % (ref 0.0–0.2)

## 2024-02-21 LAB — TROPONIN I (HIGH SENSITIVITY): Troponin I (High Sensitivity): 3 ng/L (ref <18)

## 2024-02-21 NOTE — ED Provider Notes (Signed)
 Patient left prior to evaluation   Marylynn Soho, MD 02/21/24 2054

## 2024-02-21 NOTE — ED Triage Notes (Signed)
 Patient states he's felt an intermittent flutter in his left chest for the past 2 hours; denies pain, shortness of breath.

## 2024-03-12 DIAGNOSIS — R519 Headache, unspecified: Secondary | ICD-10-CM | POA: Diagnosis not present

## 2024-03-12 DIAGNOSIS — M542 Cervicalgia: Secondary | ICD-10-CM | POA: Diagnosis not present

## 2024-03-12 DIAGNOSIS — R0683 Snoring: Secondary | ICD-10-CM | POA: Diagnosis not present

## 2024-03-12 DIAGNOSIS — G479 Sleep disorder, unspecified: Secondary | ICD-10-CM | POA: Diagnosis not present

## 2024-03-12 DIAGNOSIS — I6523 Occlusion and stenosis of bilateral carotid arteries: Secondary | ICD-10-CM | POA: Diagnosis not present

## 2024-03-12 DIAGNOSIS — E041 Nontoxic single thyroid nodule: Secondary | ICD-10-CM | POA: Diagnosis not present

## 2024-03-12 DIAGNOSIS — G4733 Obstructive sleep apnea (adult) (pediatric): Secondary | ICD-10-CM | POA: Diagnosis not present

## 2024-03-24 ENCOUNTER — Other Ambulatory Visit: Payer: Self-pay

## 2024-03-24 DIAGNOSIS — R519 Headache, unspecified: Secondary | ICD-10-CM | POA: Diagnosis not present

## 2024-03-24 DIAGNOSIS — Z7982 Long term (current) use of aspirin: Secondary | ICD-10-CM | POA: Insufficient documentation

## 2024-03-24 DIAGNOSIS — I10 Essential (primary) hypertension: Secondary | ICD-10-CM | POA: Diagnosis not present

## 2024-03-24 DIAGNOSIS — Z85828 Personal history of other malignant neoplasm of skin: Secondary | ICD-10-CM | POA: Diagnosis not present

## 2024-03-24 DIAGNOSIS — G8929 Other chronic pain: Secondary | ICD-10-CM | POA: Diagnosis not present

## 2024-03-24 NOTE — ED Triage Notes (Signed)
 Pt reports he has had a migraine for 2 days, pt has hx of same. Denies numbness weakness slurred speech.

## 2024-03-25 ENCOUNTER — Emergency Department
Admission: EM | Admit: 2024-03-25 | Discharge: 2024-03-25 | Disposition: A | Discharge status: Home / Self Care | Attending: Emergency Medicine | Admitting: Emergency Medicine

## 2024-03-25 DIAGNOSIS — I10 Essential (primary) hypertension: Secondary | ICD-10-CM | POA: Diagnosis not present

## 2024-03-25 DIAGNOSIS — R519 Headache, unspecified: Secondary | ICD-10-CM | POA: Diagnosis not present

## 2024-03-25 DIAGNOSIS — Z87891 Personal history of nicotine dependence: Secondary | ICD-10-CM | POA: Diagnosis not present

## 2024-03-25 DIAGNOSIS — G8929 Other chronic pain: Secondary | ICD-10-CM

## 2024-03-25 DIAGNOSIS — M1990 Unspecified osteoarthritis, unspecified site: Secondary | ICD-10-CM | POA: Diagnosis not present

## 2024-03-25 DIAGNOSIS — R112 Nausea with vomiting, unspecified: Secondary | ICD-10-CM | POA: Diagnosis not present

## 2024-03-25 DIAGNOSIS — K219 Gastro-esophageal reflux disease without esophagitis: Secondary | ICD-10-CM | POA: Diagnosis not present

## 2024-03-25 MED ORDER — SODIUM CHLORIDE 0.9 % IV BOLUS (SEPSIS)
1000.0000 mL | Freq: Once | INTRAVENOUS | Status: AC
  Administered 2024-03-25: 1000 mL via INTRAVENOUS

## 2024-03-25 MED ORDER — KETOROLAC TROMETHAMINE 30 MG/ML IJ SOLN
15.0000 mg | Freq: Once | INTRAMUSCULAR | Status: AC
  Administered 2024-03-25: 15 mg via INTRAVENOUS
  Filled 2024-03-25: qty 1

## 2024-03-25 MED ORDER — DIPHENHYDRAMINE HCL 50 MG/ML IJ SOLN
25.0000 mg | Freq: Once | INTRAMUSCULAR | Status: AC
  Administered 2024-03-25: 25 mg via INTRAVENOUS
  Filled 2024-03-25: qty 1

## 2024-03-25 MED ORDER — METOCLOPRAMIDE HCL 5 MG/ML IJ SOLN
5.0000 mg | Freq: Once | INTRAMUSCULAR | Status: AC
  Administered 2024-03-25: 5 mg via INTRAVENOUS
  Filled 2024-03-25: qty 2

## 2024-03-25 NOTE — ED Provider Notes (Signed)
 [facility] Provider Note    Event Date/Time   First MD Initiated Contact with Patient 03/25/24 0209     (approximate)   History   Migraine   HPI  Johnathan Harvey is a 68 y.o. male with history of chronic headaches, hypertension who presents to the emergency department with complaints of generalized headache that feels similar to his chronic headaches.  No head injury, fevers, numbness, tingling, weakness.  Not on blood thinners.  Patient followed by neurology as an outpatient.   History provided by patient.    Past Medical History:  Diagnosis Date   Arthritis    Esophageal stricture    GERD (gastroesophageal reflux disease)    History of MRSA infection 2016   knee wound   Hypertension    was put on medications in [place] stopped shortly after, no longer needs meds   Migraine    Skin cancer, basal cell     Past Surgical History:  Procedure Laterality Date   ANKLE ARTHROPLASTY     ESOPHAGOGASTRODUODENOSCOPY N/A 11/04/2018   Procedure: ESOPHAGOGASTRODUODENOSCOPY (EGD);  Surgeon: Selena Daily, MD;  Location: [facility] ENDOSCOPY;  Service: Gastroenterology;  Laterality: N/A;   INGUINAL HERNIA REPAIR Left 09/26/2019   Procedure: LEFT INGUINAL HERNIA REPAIR;  Surgeon: Jacolyn Matar, MD;  Location: [facility];  Service: General;  Laterality: Left;    MEDICATIONS:  Prior to Admission medications   Medication Sig Start Date End Date Taking? Authorizing Provider  amLODipine (NORVASC) 5 MG tablet Take 5 mg by mouth daily. 08/23/22   [provider]  aspirin-acetaminophen -caffeine  (EXCEDRIN MIGRAINE) 250-250-65 MG tablet Take by mouth every 6 (six) hours as needed for headache. Takes 3 pills in am , Excedrin migraine    [provider]  cyclobenzaprine  (FLEXERIL ) 10 MG tablet Take 1 tablet (10 mg total) by mouth at bedtime. 10/02/22 05/29/23  Renaldo Caroli, MD  gabapentin  (NEURONTIN ) 300 MG capsule Take 3  capsules (900 mg total) by mouth at bedtime. 10/02/22 01/01/24  Naveira, Francisco, MD  hydrochlorothiazide (HYDRODIURIL) 25 MG tablet Take 12.5 mg by mouth daily. 12/14/22 12/14/23  [provider]  HYDROcodone -acetaminophen  (NORCO/VICODIN) 5-325 MG tablet Take 1 tablet by mouth every 8 (eight) hours as needed for up to 7 days for severe pain (pain score 7-10). Must last for 7 days. 10/23/23 10/30/23  Renaldo Caroli, MD  ondansetron  (ZOFRAN -ODT) 4 MG disintegrating tablet Take 1 tablet (4 mg total) by mouth every 6 (six) hours as needed for nausea or vomiting. 11/07/23   Lawerence Dery, Clover Dao, DO    Physical Exam   Triage Vital Signs: ED Triage Vitals  Encounter Vitals Group     BP 03/24/24 2359 (!) 137/90     Systolic BP Percentile --      Diastolic BP Percentile --      Pulse Rate 03/24/24 2359 78     Resp 03/24/24 2359 20     Temp 03/24/24 2359 98.5 F (36.9 C)     Temp src --      SpO2 03/24/24 2359 100 %     Weight 03/24/24 2358 135 lb (61.2 kg)     Height 03/24/24 2358 5\' 10"  (1.778 m)     Head Circumference --      Peak Flow --      Pain Score 03/24/24 2358 10     Pain Loc --      Pain Education --      Exclude from Growth Chart --  Most recent vital signs: Vitals:   03/24/24 2359  BP: (!) 137/90  Pulse: 78  Resp: 20  Temp: 98.5 F (36.9 C)  SpO2: 100%    CONSTITUTIONAL: Alert, responds appropriately to questions. Well-appearing; well-nourished HEAD: Normocephalic, atraumatic EYES: Conjunctivae clear, pupils appear equal, sclera nonicteric ENT: normal nose; moist mucous membranes NECK: Supple, normal ROM CARD: RRR; S1 and S2 appreciated RESP: Normal chest excursion without splinting or tachypnea; breath sounds clear and equal bilaterally; no wheezes, no rhonchi, no rales, no hypoxia or respiratory distress, speaking full sentences ABD/GI: Non-distended; soft, non-tender, no rebound, no guarding, no peritoneal signs BACK: The back appears normal EXT: Normal  ROM in all joints; no deformity noted, no edema SKIN: Normal color for age and race; warm; no rash on exposed skin NEURO: Moves all extremities equally, normal speech, normal sensation, no facial asymmetry, normal gait PSYCH: The patient's mood and manner are appropriate.   ED Results / Procedures / Treatments   LABS: (all labs ordered are listed, but only abnormal results are displayed) Labs Reviewed - No data to display   EKG:  EKG Interpretation Date/Time:    Ventricular Rate:    PR Interval:    QRS Duration:    QT Interval:    QTC Calculation:   R Axis:      Text Interpretation:           RADIOLOGY: My personal review and interpretation of imaging:    I have personally reviewed all radiology reports.   No results found.   PROCEDURES:  Critical Care performed: No    Procedures    IMPRESSION / MDM / ASSESSMENT AND PLAN / ED COURSE  I reviewed the triage vital signs and the nursing notes.    Patient here with complaints of headache.  Chronic history of the same.  Followed by neurology.  The patient is on the cardiac monitor to evaluate for evidence of arrhythmia and/or significant heart rate changes.   DIFFERENTIAL DIAGNOSIS (includes but not limited to):   Migraine, tension headache, doubt intracranial hemorrhage, meningitis, stroke, cavernous sinus thrombosis   Patient's presentation is most consistent with acute, uncomplicated illness.   PLAN: Will give migraine cocktail as this has worked well for patient before.  No focal neurodeficits.  No fever.  No head injury.  No indication for emergent imaging.   MEDICATIONS GIVEN IN ED: Medications  sodium chloride  0.9 % bolus 1,000 mL (0 mLs Intravenous Stopped 03/25/24 0415)  ketorolac  (TORADOL ) 30 MG/ML injection 15 mg (15 mg Intravenous Given 03/25/24 0218)  metoCLOPramide  (REGLAN ) injection 5 mg (5 mg Intravenous Given 03/25/24 0216)  diphenhydrAMINE  (BENADRYL ) injection 25 mg (25 mg Intravenous  Given 03/25/24 0217)     ED COURSE: Patient given migraine cocktail.  When I went to reassess patient he had left the emergency department.  I was not able to reassess him or provide discharge instructions.   CONSULTS: Patient eloped.   OUTSIDE RECORDS REVIEWED: Reviewed recent neurology notes.       FINAL CLINICAL IMPRESSION(S) / ED DIAGNOSES   Final diagnoses:  Chronic nonintractable headache, unspecified headache type     Rx / DC Orders   ED Discharge Orders     None        Note:  This document was prepared using Dragon voice recognition software and may include unintentional dictation errors.   Yarielis Funaro, Clover Dao, DO 03/25/24 [phone]

## 2024-03-25 NOTE — ED Notes (Signed)
 Pt c/o  migraine HA with nausea, light sensitivity, pt has hx of chronic migraines.

## 2024-03-25 NOTE — ED Notes (Signed)
 Pt left prior tp d/c paperwork

## 2024-03-25 NOTE — ED Notes (Signed)
 Patient not in hallway when went to check on patient.

## 2024-03-31 DIAGNOSIS — G4733 Obstructive sleep apnea (adult) (pediatric): Secondary | ICD-10-CM | POA: Diagnosis not present

## 2024-03-31 DIAGNOSIS — G4486 Cervicogenic headache: Secondary | ICD-10-CM | POA: Diagnosis not present

## 2024-04-17 DIAGNOSIS — G894 Chronic pain syndrome: Secondary | ICD-10-CM | POA: Diagnosis not present

## 2024-04-27 NOTE — Patient Instructions (Signed)

## 2024-04-27 NOTE — Progress Notes (Unsigned)
 PROVIDER NOTE: Interpretation of information contained herein should be left to medically-trained personnel. Specific patient instructions are provided elsewhere under Patient Instructions section of medical record. This document was created in part using AI and STT-dictation technology, any transcriptional errors that may result from this process are unintentional.  Patient: Johnathan Harvey  Service: E/M   PCP: Cleotilde Oneil FALCON, MD  DOB: 1956/08/14  DOS: 04/28/2024  Provider: Eric DELENA Como, MD  MRN: [id number]  Delivery: Face-to-face  Specialty: Interventional Pain Management  Type: Established Patient  Setting: Ambulatory outpatient facility  Specialty designation: 09  Referring Prov.: Cleotilde Oneil FALCON, MD  Location: Outpatient office facility       History of present illness (HPI) Johnathan Harvey, a 68 y.o. year old male, is here today because of his Cervicogenic headache [G44.86]. Johnathan Harvey's primary complain today is No chief complaint on file.  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); Cervical facet arthropathy (Multilevel) (Bilateral); DDD (degenerative disc disease), cervical; Cervical foraminal stenosis (Bilateral: C6-7) (Right: C4-5) (Left: C5-6); Cervicogenic headache (2ry area of Pain) (Right); Cervical facet syndrome; Spondylosis without myelopathy or radiculopathy, cervical region; Occipital headache (Right); Neck pain of over 3 months duration; Cervical neck pain with evidence of disc disease; Chronic neck and back pain; Musculoskeletal disorder involving upper trapezius muscle (Right); Cervical facet hypertrophy (Multilevel) (Bilateral);  Postoperative pain after spinal surgery; Neck pain with history of cervical spinal surgery; Spinal stenosis in cervical region; and Cervical facet joint pain on their pertinent problem list.  Pain Assessment: Severity of   is reported as a  /10. Location:    / . Onset:  . Quality:  . Timing:  . Modifying factor(s):  SABRA Vitals:  vitals were not taken for this visit.  BMI: Estimated body mass index is 19.37 kg/m as calculated from the following:   Height as of 03/24/24: 5' 10 (1.778 m).   Weight as of 03/24/24: 135 lb (61.2 kg).  Last encounter: 01/01/2024. Last procedure: 10/04/2023.  Reason for encounter: evaluation of worsening, or previously known (established) problem.   Discussed the use of AI scribe software for clinical note transcription with the patient, who gave verbal consent to proceed.  History of Present Illness           Pharmacotherapy Assessment   No chronic opioid analgesics therapy prescribed by our practice.  Oxycodone/APAP 5/325 (# 20) 1 4 times daily (last filled on 08/31/2022) prescribed by Dorn Zachary Ned, MD  MME/day: 30 mg/day   Monitoring: [place] PMP: PDMP reviewed during this encounter.       Pharmacotherapy: No side-effects or adverse reactions reported. Compliance: No problems identified. Effectiveness: Clinically acceptable.  No notes on file  UDS:  No results found for: SUMMARY  No results found for: CBDTHCR No results found for: D8THCCBX No results found for: D9THCCBX  ROS  Constitutional: Denies any fever or chills Gastrointestinal: No reported hemesis, hematochezia, vomiting, or acute GI distress Musculoskeletal: Denies any acute onset joint swelling, redness, loss of ROM, or weakness Neurological: No reported episodes of acute onset apraxia, aphasia, dysarthria, agnosia, amnesia, paralysis, loss of coordination, or loss of consciousness  Medication Review  HYDROcodone -acetaminophen , amLODipine, aspirin-acetaminophen -caffeine ,  cyclobenzaprine , gabapentin , hydrochlorothiazide, and ondansetron   History Review  Allergy: Johnathan Harvey is  allergic to protonix  [pantoprazole ]. Drug: Johnathan Harvey  reports current drug use. Drug: Marijuana. Alcohol:  reports no history of alcohol use. Tobacco:  reports that he has quit smoking. His smoking use included cigarettes. He has never used smokeless tobacco. Social: Johnathan Harvey  reports that he has quit smoking. His smoking use included cigarettes. He has never used smokeless tobacco. He reports current drug use. Drug: Marijuana. He reports that he does not drink alcohol. Medical:  has a past medical history of Arthritis, Esophageal stricture, GERD (gastroesophageal reflux disease), History of MRSA infection (2016), Hypertension, Migraine, and Skin cancer, basal cell. Surgical: Johnathan Harvey  has a past surgical history that includes Ankle arthroplasty; Esophagogastroduodenoscopy (N/A, 11/04/2018); and Inguinal hernia repair (Left, 09/26/2019). Family: family history includes Cancer in his father and mother.  Laboratory Chemistry Profile   Renal Lab Results  Component Value Date   BUN 12 02/21/2024   CREATININE 0.97 02/21/2024   GFRAA >60 10/10/2019   GFRNONAA >60 02/21/2024    Hepatic Lab Results  Component Value Date   AST 28 11/06/2023   ALT 22 11/06/2023   ALBUMIN 4.8 11/06/2023   ALKPHOS 76 11/06/2023   LIPASE 50 11/06/2023    Electrolytes Lab Results  Component Value Date   NA 140 02/21/2024   K 3.4 (L) 02/21/2024   CL 105 02/21/2024   CALCIUM 8.7 (L) 02/21/2024   MG 2.3 11/07/2023    Bone No results found for: VD25OH, CI874NY7UNU, CI6874NY7, CI7874NY7, 25OHVITD1, 25OHVITD2, 25OHVITD3, TESTOFREE, TESTOSTERONE  Inflammation (CRP: Acute Phase) (ESR: Chronic Phase) Lab Results  Component Value Date   LATICACIDVEN 0.8 10/10/2019         Note: Above Lab results reviewed.  Recent Imaging Review  DG Chest 2 View CLINICAL DATA:  Chest  discomfort  EXAM: CHEST - 2 VIEW  COMPARISON:  03/11/2021  FINDINGS: The heart size and mediastinal contours are within normal limits. Both lungs are clear. The visualized skeletal structures are unremarkable.  IMPRESSION: No active cardiopulmonary disease.  Electronically Signed   By: Franky Crease M.D.   On: 02/21/2024 19:30 Note: Reviewed        Physical Exam  Vitals: There were no vitals taken for this visit. BMI: Estimated body mass index is 19.37 kg/m as calculated from the following:   Height as of 03/24/24: 5' 10 (1.778 m).   Weight as of 03/24/24: 135 lb (61.2 kg). Ideal: Patient weight not recorded General appearance: Well nourished, well developed, and well hydrated. In no apparent acute distress Mental status: Alert, oriented x 3 (person, place, & time)       Respiratory: No evidence of acute respiratory distress Eyes: PERLA   Assessment   Diagnosis Status  1. Cervicogenic headache (2ry area of Pain) (Right)   2. Chronic tension-type headache, intractable   3. Headache disorder   4. Cervicalgia (1ry area of Pain)   5. Cervical facet joint pain   6. Cervical facet syndrome   7. Cervical facet hypertrophy (Multilevel) (Bilateral)   8. Abnormal MRI, cervical spine (09/28/2021)   9. Neck pain of over 3 months duration   10. Neck pain with history of cervical spinal surgery    Controlled Controlled Controlled   Updated Problems: No problems updated.  Plan of Care  Problem-specific:  Assessment and Plan            Johnathan Harvey has a current medication list which includes the following long-term medication(s): amlodipine, cyclobenzaprine , gabapentin , hydrochlorothiazide, and hydrocodone -acetaminophen .  Pharmacotherapy (Medications  Ordered): No orders of the defined types were placed in this encounter.  Orders:  No orders of the defined types were placed in this encounter.    Interventional Therapies  Risk  Complexity Considerations:    Note: Difficult IV stick.   Planned  Pending:      Under consideration:   Repeat cervical MRI due to persistent pain despite interventional treatments and to evaluate patency of canal for possible spinal cord stimulator trial.   Completed:   Diagnostic/therapeutic bilateral (C3 + TON) cervical facet RFA x1 (10/04/2023) (100/100/0/0) Therapeutic right C3-C8 cervical MB RFA x1 (11/23/2022) (100/100/100/97) Diagnostic bilateral cervical facet MBB x4 (10/19/2022) (100/100/35/35)  Diagnostic bilateral cervical facet C6-T1 MBB x1 (05/25/2022) (100/100/85/85)  Diagnostic right cervical facet C3-C5 MBB x1 (05/25/2022) (100/100/85/85)  Diagnostic right cervical facet C3-C7 MBB x1 (04/25/2022) (100/100/60/100) (100-Neck/60-H/A)  Diagnostic bilateral cervical facet C7 & T1 MBB x1 (04/25/2022) (100/100/60/100) (100-Neck/60-H/A)  Diagnostic right Cervical ESI x1 (03/23/2022) (DOS: 10-10/10) (0/0/0/0) (patient indicated having gone to the ED x4 since procedure.)   Completed by other providers:   Therapeutic right C3 and C4 medial branch RFA x1 (07/30/2020) by Morene Falcon, DO [facility] PMR)  Diagnostic right C3 and C4 MBB x2 (06/30/2020, 07/15/2020) by Morene Falcon, DO [facility] PMR)  Diagnostic right C3-4 zygapophyseal joint (facet joint) inj. x1 (03/05/2020) by Morene Falcon, DO Therapeutic  ACDF C4/5, C5/6, C6/7 (07/04/2022) by Dorn Ned, MD ([facility]) (80% improvement)    Therapeutic  Palliative (PRN) options:   None established   Pharmacotherapy:  Nonopioid transferred (10/02/2022): Gabapentin  (Neurontin ) 300 mg, 3 tabs p.o. at bedtime; Flexeril  10 mg, 1 tab p.o. at bedtime. I believe this regimen to be appropriate for this patient however, we no longer have the manpower to continue managing medications on these patients and therefore I have kindly request that for his PCP to consider taking over it.  The patient was informed that should they decide not to, we do not plan on continuing with this  regimen because of the above reasons. Recommendations:   None at this time.     No follow-ups on file.    Recent Visits No visits were found meeting these conditions. Showing recent visits within past 90 days and meeting all other requirements Future Appointments Date Type Provider Dept  04/28/24 Appointment Tanya Glisson, MD [facility]  Showing future appointments within next 90 days and meeting all other requirements  I discussed the assessment and treatment plan with the patient. The patient was provided an opportunity to ask questions and all were answered. The patient agreed with the plan and demonstrated an understanding of the instructions.  Patient advised to call back or seek an in-person evaluation if the symptoms or condition worsens.  Duration of encounter: *** minutes.  Total time on encounter, as per AMA guidelines included both the face-to-face and non-face-to-face time personally spent by the physician and/or other qualified health care professional(s) on the day of the encounter (includes time in activities that require the physician or other qualified health care professional and does not include time in activities normally performed by clinical staff). Physician's time may include the following activities when performed: Preparing to see the patient (e.g., pre-charting review of records, searching for previously ordered imaging, lab work, and nerve conduction tests) Review of prior analgesic pharmacotherapies. Reviewing PMP Interpreting ordered tests (e.g., lab work, imaging, nerve conduction tests) Performing post-procedure evaluations, including interpretation of diagnostic procedures Obtaining and/or reviewing separately obtained history Performing a medically appropriate examination and/or  evaluation Counseling and educating the patient/family/caregiver Ordering medications, tests, or procedures Referring and communicating with other health care  professionals (when not separately reported) Documenting clinical information in the electronic or other health record Independently interpreting results (not separately reported) and communicating results to the patient/ family/caregiver Care coordination (not separately reported)  Note by: Eric DELENA Como, MD (TTS and AI technology used. I apologize for any typographical errors that were not detected and corrected.) Date: 04/28/2024; Time: 6:42 PM

## 2024-04-28 ENCOUNTER — Ambulatory Visit (HOSPITAL_BASED_OUTPATIENT_CLINIC_OR_DEPARTMENT_OTHER): Admitting: Pain Medicine

## 2024-04-28 DIAGNOSIS — G4733 Obstructive sleep apnea (adult) (pediatric): Secondary | ICD-10-CM | POA: Diagnosis not present

## 2024-04-28 DIAGNOSIS — R519 Headache, unspecified: Secondary | ICD-10-CM

## 2024-04-28 DIAGNOSIS — Z91199 Patient's noncompliance with other medical treatment and regimen due to unspecified reason: Secondary | ICD-10-CM

## 2024-04-28 DIAGNOSIS — R937 Abnormal findings on diagnostic imaging of other parts of musculoskeletal system: Secondary | ICD-10-CM

## 2024-04-28 DIAGNOSIS — G8929 Other chronic pain: Secondary | ICD-10-CM

## 2024-04-28 DIAGNOSIS — G4486 Cervicogenic headache: Secondary | ICD-10-CM

## 2024-04-28 DIAGNOSIS — G44221 Chronic tension-type headache, intractable: Secondary | ICD-10-CM

## 2024-04-28 DIAGNOSIS — M47812 Spondylosis without myelopathy or radiculopathy, cervical region: Secondary | ICD-10-CM

## 2024-04-28 DIAGNOSIS — M542 Cervicalgia: Secondary | ICD-10-CM

## 2024-05-15 DIAGNOSIS — H6121 Impacted cerumen, right ear: Secondary | ICD-10-CM | POA: Diagnosis not present

## 2024-05-15 DIAGNOSIS — M542 Cervicalgia: Secondary | ICD-10-CM | POA: Diagnosis not present

## 2024-05-15 DIAGNOSIS — R42 Dizziness and giddiness: Secondary | ICD-10-CM | POA: Diagnosis not present

## 2024-05-15 DIAGNOSIS — G4733 Obstructive sleep apnea (adult) (pediatric): Secondary | ICD-10-CM | POA: Diagnosis not present

## 2024-05-29 DIAGNOSIS — R0683 Snoring: Secondary | ICD-10-CM | POA: Diagnosis not present

## 2024-05-29 DIAGNOSIS — I6523 Occlusion and stenosis of bilateral carotid arteries: Secondary | ICD-10-CM | POA: Diagnosis not present

## 2024-05-29 DIAGNOSIS — G479 Sleep disorder, unspecified: Secondary | ICD-10-CM | POA: Diagnosis not present

## 2024-05-29 DIAGNOSIS — G4733 Obstructive sleep apnea (adult) (pediatric): Secondary | ICD-10-CM | POA: Diagnosis not present

## 2024-05-29 DIAGNOSIS — R519 Headache, unspecified: Secondary | ICD-10-CM | POA: Diagnosis not present

## 2024-05-29 DIAGNOSIS — G4486 Cervicogenic headache: Secondary | ICD-10-CM | POA: Diagnosis not present

## 2024-05-29 DIAGNOSIS — M542 Cervicalgia: Secondary | ICD-10-CM | POA: Diagnosis not present

## 2024-05-29 DIAGNOSIS — R51 Headache with orthostatic component, not elsewhere classified: Secondary | ICD-10-CM | POA: Diagnosis not present

## 2024-06-04 DIAGNOSIS — M7918 Myalgia, other site: Secondary | ICD-10-CM | POA: Diagnosis not present

## 2024-06-04 DIAGNOSIS — M4802 Spinal stenosis, cervical region: Secondary | ICD-10-CM | POA: Diagnosis not present

## 2024-06-29 DIAGNOSIS — G4733 Obstructive sleep apnea (adult) (pediatric): Secondary | ICD-10-CM | POA: Diagnosis not present

## 2024-07-10 DIAGNOSIS — R34 Anuria and oliguria: Secondary | ICD-10-CM | POA: Diagnosis not present

## 2024-07-29 DIAGNOSIS — G4733 Obstructive sleep apnea (adult) (pediatric): Secondary | ICD-10-CM | POA: Diagnosis not present

## 2024-08-01 DIAGNOSIS — R972 Elevated prostate specific antigen [PSA]: Secondary | ICD-10-CM | POA: Diagnosis not present

## 2024-08-01 DIAGNOSIS — Z79899 Other long term (current) drug therapy: Secondary | ICD-10-CM | POA: Diagnosis not present

## 2024-08-01 DIAGNOSIS — I10 Essential (primary) hypertension: Secondary | ICD-10-CM | POA: Diagnosis not present

## 2024-08-01 DIAGNOSIS — E538 Deficiency of other specified B group vitamins: Secondary | ICD-10-CM | POA: Diagnosis not present

## 2024-08-01 DIAGNOSIS — Z125 Encounter for screening for malignant neoplasm of prostate: Secondary | ICD-10-CM | POA: Diagnosis not present

## 2024-08-29 DIAGNOSIS — G4733 Obstructive sleep apnea (adult) (pediatric): Secondary | ICD-10-CM | POA: Diagnosis not present

## 2024-09-28 DIAGNOSIS — G4733 Obstructive sleep apnea (adult) (pediatric): Secondary | ICD-10-CM | POA: Diagnosis not present
